# Patient Record
Sex: Male | Born: 1950 | Race: White | Hispanic: No | Marital: Married | Smoking: Former smoker
Health system: Southern US, Community
[De-identification: ages and names within clinical notes are randomized; demographics above are authoritative.]

## PROBLEM LIST (undated history)

## (undated) DIAGNOSIS — K2970 Gastritis, unspecified, without bleeding: Secondary | ICD-10-CM

## (undated) DIAGNOSIS — N529 Male erectile dysfunction, unspecified: Secondary | ICD-10-CM

## (undated) DIAGNOSIS — I10 Essential (primary) hypertension: Secondary | ICD-10-CM

## (undated) DIAGNOSIS — K219 Gastro-esophageal reflux disease without esophagitis: Secondary | ICD-10-CM

## (undated) DIAGNOSIS — I208 Other forms of angina pectoris: Secondary | ICD-10-CM

## (undated) DIAGNOSIS — Z8719 Personal history of other diseases of the digestive system: Secondary | ICD-10-CM

## (undated) DIAGNOSIS — E291 Testicular hypofunction: Secondary | ICD-10-CM

## (undated) DIAGNOSIS — E039 Hypothyroidism, unspecified: Secondary | ICD-10-CM

## (undated) DIAGNOSIS — E7800 Pure hypercholesterolemia, unspecified: Secondary | ICD-10-CM

## (undated) DIAGNOSIS — I2089 Other forms of angina pectoris: Secondary | ICD-10-CM

## (undated) DIAGNOSIS — E669 Obesity, unspecified: Secondary | ICD-10-CM

## (undated) DIAGNOSIS — D369 Benign neoplasm, unspecified site: Secondary | ICD-10-CM

## (undated) DIAGNOSIS — G4733 Obstructive sleep apnea (adult) (pediatric): Secondary | ICD-10-CM

## (undated) DIAGNOSIS — J309 Allergic rhinitis, unspecified: Secondary | ICD-10-CM

## (undated) DIAGNOSIS — R7303 Prediabetes: Secondary | ICD-10-CM

## (undated) DIAGNOSIS — K760 Fatty (change of) liver, not elsewhere classified: Secondary | ICD-10-CM

## (undated) DIAGNOSIS — M519 Unspecified thoracic, thoracolumbar and lumbosacral intervertebral disc disorder: Secondary | ICD-10-CM

## (undated) HISTORY — PX: VASECTOMY: SHX75

## (undated) HISTORY — DX: Essential (primary) hypertension: I10

## (undated) HISTORY — DX: Unspecified thoracic, thoracolumbar and lumbosacral intervertebral disc disorder: M51.9

## (undated) HISTORY — DX: Benign neoplasm, unspecified site: D36.9

## (undated) HISTORY — DX: Obesity, unspecified: E66.9

## (undated) HISTORY — DX: Obstructive sleep apnea (adult) (pediatric): G47.33

## (undated) HISTORY — DX: Testicular hypofunction: E29.1

## (undated) HISTORY — PX: OTHER SURGICAL HISTORY: SHX169

## (undated) HISTORY — DX: Other forms of angina pectoris: I20.8

## (undated) HISTORY — DX: Male erectile dysfunction, unspecified: N52.9

## (undated) HISTORY — DX: Allergic rhinitis, unspecified: J30.9

## (undated) HISTORY — DX: Prediabetes: R73.03

## (undated) HISTORY — DX: Personal history of other diseases of the digestive system: Z87.19

## (undated) HISTORY — DX: Pure hypercholesterolemia, unspecified: E78.00

## (undated) HISTORY — DX: Gastritis, unspecified, without bleeding: K29.70

## (undated) HISTORY — DX: Other forms of angina pectoris: I20.89

## (undated) HISTORY — DX: Fatty (change of) liver, not elsewhere classified: K76.0

---
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
## 1980-11-07 DIAGNOSIS — Z8719 Personal history of other diseases of the digestive system: Secondary | ICD-10-CM

## 1980-11-07 HISTORY — DX: Personal history of other diseases of the digestive system: Z87.19

## 2005-06-23 ENCOUNTER — Ambulatory Visit (HOSPITAL_COMMUNITY): Admission: RE | Admit: 2005-06-23 | Discharge: 2005-06-23 | Payer: Self-pay | Admitting: Family Medicine

## 2005-07-08 ENCOUNTER — Ambulatory Visit: Admission: RE | Admit: 2005-07-08 | Discharge: 2005-07-08 | Payer: Self-pay | Admitting: Family Medicine

## 2006-01-06 ENCOUNTER — Emergency Department (HOSPITAL_COMMUNITY): Admission: EM | Admit: 2006-01-06 | Discharge: 2006-01-06 | Payer: Self-pay | Admitting: Emergency Medicine

## 2008-01-24 ENCOUNTER — Ambulatory Visit (HOSPITAL_COMMUNITY): Admission: RE | Admit: 2008-01-24 | Discharge: 2008-01-24 | Payer: Self-pay | Admitting: Family Medicine

## 2009-11-07 HISTORY — PX: OTHER SURGICAL HISTORY: SHX169

## 2010-10-14 ENCOUNTER — Ambulatory Visit: Payer: Self-pay | Admitting: Cardiology

## 2011-09-15 ENCOUNTER — Encounter (HOSPITAL_COMMUNITY): Payer: Self-pay | Admitting: Pharmacy Technician

## 2011-09-16 ENCOUNTER — Other Ambulatory Visit: Payer: Self-pay | Admitting: Orthopedic Surgery

## 2011-09-23 NOTE — H&P (Signed)
  Martin Hopkins 09/23/2011 9:52 AM Location: [facility] DOB: 1951/05/01 Married / Language: English / Race: White Male   History of Present Illness The patient is a 60 year old male who presents today for history and physical. They have been seen for low back pain radiating down his left lower extremity following an injury.The patient presents today for a complete history and physical in preparation for his hemilaminectomy/microdiscectomy at L5-S1 on Wednesday, September 28, 2011.   Problem List/Past Medical Pain in joint, lower leg (719.46). 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 02/16/1988   Allergies PENICILLIN. 12/04/2009   Family History Myocardial Infarction. Father. deceased age 5 Hypertension. Brother, Mother.   Social History Marital status. Married. Tobacco use. Former smoker. Alcohol use. Never consumed alcohol. Children. 3. Living situation. Lives with spouse. 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 Living will   Medication History Synthroid ( Tablet, Oral) Active. Zocor (40MG  Tablet, Oral) Active.   Past Surgical History "Nose" surgery 1972 Tenosynovectomy 2011. Right. long finger   Other Problems Hypercholesterolemia. Currently on Zocor 40mg  daily Hypothyroidism. Currently on Synthroid 0.25mg  daily Measles. Childhood Mumps. Childhood   Review of Systems General:Not Present- Chills, Fever, Night Sweats, Fatigue, Weight Gain, Weight Loss and Memory Loss. Skin:Not Present- Hives, Itching, Rash, Eczema and Lesions. HEENT:Not Present- Tinnitus, Headache, Double Vision, Visual Loss, Hearing Loss and Dentures. Respiratory:Not Present- Shortness of breath with exertion, Shortness of breath at rest, Allergies, Coughing up blood and Chronic Cough. Cardiovascular:Not  Present- Chest Pain, Racing/skipping heartbeats, Difficulty Breathing Lying Down, Murmur, Swelling and Palpitations. Gastrointestinal:Not Present- Bloody Stool, Heartburn, Abdominal Pain, Vomiting, Nausea, Constipation, Diarrhea, Difficulty Swallowing, Jaundice and Loss of appetitie. Male Genitourinary:Not Present- Urinary frequency, Blood in Urine, Weak urinary stream, Discharge, Flank Pain, Incontinence, Painful Urination, Urgency, Urinary Retention and Urinating at Night. Musculoskeletal:Present- Muscle Pain and Backache. Not Present- Joint Swelling, Joint Pain, Back Pain, Morning Stiffness and Spasms. Neurological:Not Present- Tremor, Dizziness, Blackout spells, Paralysis, Difficulty with balance and Weakness. Psychiatric:Not Present- Insomnia.   Vitals Weight: 225 lb Pulse: 88 (Regular) Resp.: 18 (Unlabored) BP: 138/84 (Sitting, Left Arm, Standard)   Physical Exam The physical exam findings are as follows:  General Mental Status - Alert, cooperative and good historian. General Appearance- pleasant. Not in acute distress. Orientation- Oriented X3. Build & Nutrition- Well nourished and Well developed.  Head and Neck Head- normocephalic, atraumatic . Neck Global Assessment- supple. no bruit auscultated on the right and no bruit auscultated on the left.  Eye Pupil- Bilateral- PERRLA. Motion- Bilateral- EOMI.  Chest and Lung Exam Auscultation: Breath sounds:- clear at anterior chest wall and - clear at posterior chest wall. Adventitious sounds:- No Adventitious sounds.  Cardiovascular Auscultation:Rhythm- Regular rate and rhythm. Heart Sounds- S1 WNL and S2 WNL. Murmurs & Other Heart Sounds:Auscultation of the heart reveals - No Murmurs.  Abdomen Palpation/Percussion:Tenderness- Abdomen is non-tender to palpation. Rigidity (guarding)- Abdomen is soft. Auscultation:Auscultation of the abdomen reveals - Bowel sounds  normal.  Musculoskeletal The patient has painful range of motion in his back. Hyperflexion at the lumbar spine results in left sciatica pain. No CVA tenderness or spasms. Straight leg raise is negative bilaterally. Strength and sensation intact. Pt is neurovascular intact. DTR normal bilaterally. Pt ambulates without support.  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

## 2011-09-26 ENCOUNTER — Ambulatory Visit (HOSPITAL_COMMUNITY)
Admission: RE | Admit: 2011-09-26 | Discharge: 2011-09-26 | Disposition: A | Discharge status: Home / Self Care | Payer: PRIVATE HEALTH INSURANCE | Source: Ambulatory Visit | Attending: Orthopedic Surgery | Admitting: Orthopedic Surgery

## 2011-09-26 ENCOUNTER — Other Ambulatory Visit: Payer: Self-pay | Admitting: Orthopedic Surgery

## 2011-09-26 ENCOUNTER — Encounter (HOSPITAL_COMMUNITY): Payer: Self-pay

## 2011-09-26 ENCOUNTER — Encounter (HOSPITAL_COMMUNITY)
Admission: RE | Admit: 2011-09-26 | Discharge: 2011-09-26 | Disposition: A | Discharge status: Home / Self Care | Payer: PRIVATE HEALTH INSURANCE | Source: Ambulatory Visit | Attending: Orthopedic Surgery | Admitting: Orthopedic Surgery

## 2011-09-26 DIAGNOSIS — Z01812 Encounter for preprocedural laboratory examination: Secondary | ICD-10-CM | POA: Insufficient documentation

## 2011-09-26 DIAGNOSIS — IMO0002 Reserved for concepts with insufficient information to code with codable children: Secondary | ICD-10-CM | POA: Insufficient documentation

## 2011-09-26 DIAGNOSIS — M549 Dorsalgia, unspecified: Secondary | ICD-10-CM | POA: Insufficient documentation

## 2011-09-26 DIAGNOSIS — I700 Atherosclerosis of aorta: Secondary | ICD-10-CM | POA: Insufficient documentation

## 2011-09-26 DIAGNOSIS — I708 Atherosclerosis of other arteries: Secondary | ICD-10-CM | POA: Insufficient documentation

## 2011-09-26 DIAGNOSIS — M47817 Spondylosis without myelopathy or radiculopathy, lumbosacral region: Secondary | ICD-10-CM | POA: Insufficient documentation

## 2011-09-26 HISTORY — DX: Hypothyroidism, unspecified: E03.9

## 2011-09-26 HISTORY — DX: Gastro-esophageal reflux disease without esophagitis: K21.9

## 2011-09-26 LAB — URINALYSIS, ROUTINE W REFLEX MICROSCOPIC
Bilirubin Urine: NEGATIVE
Glucose, UA: NEGATIVE mg/dL
Ketones, ur: NEGATIVE mg/dL
Leukocytes, UA: NEGATIVE
Nitrite: NEGATIVE
Protein, ur: NEGATIVE mg/dL
Specific Gravity, Urine: 1.004 — ABNORMAL LOW (ref 1.005–1.030)
Urobilinogen, UA: 0.2 mg/dL (ref 0.0–1.0)
pH: 6.5 (ref 5.0–8.0)

## 2011-09-26 LAB — APTT: aPTT: 37 seconds (ref 24–37)

## 2011-09-26 LAB — PROTIME-INR
INR: 1 (ref 0.00–1.49)
Prothrombin Time: 13.4 seconds (ref 11.6–15.2)

## 2011-09-26 LAB — DIFFERENTIAL
Basophils Absolute: 0 10*3/uL (ref 0.0–0.1)
Basophils Relative: 1 % (ref 0–1)
Eosinophils Absolute: 0.2 10*3/uL (ref 0.0–0.7)
Eosinophils Relative: 2 % (ref 0–5)
Lymphocytes Relative: 31 % (ref 12–46)
Lymphs Abs: 2.3 10*3/uL (ref 0.7–4.0)
Monocytes Absolute: 0.7 10*3/uL (ref 0.1–1.0)
Monocytes Relative: 9 % (ref 3–12)
Neutro Abs: 4.3 10*3/uL (ref 1.7–7.7)
Neutrophils Relative %: 57 % (ref 43–77)

## 2011-09-26 LAB — SURGICAL PCR SCREEN
MRSA, PCR: NEGATIVE
Staphylococcus aureus: NEGATIVE

## 2011-09-26 LAB — COMPREHENSIVE METABOLIC PANEL
ALT: 70 U/L — ABNORMAL HIGH (ref 0–53)
AST: 42 U/L — ABNORMAL HIGH (ref 0–37)
Albumin: 4.3 g/dL (ref 3.5–5.2)
Alkaline Phosphatase: 73 U/L (ref 39–117)
BUN: 12 mg/dL (ref 6–23)
CO2: 28 mEq/L (ref 19–32)
Calcium: 9.7 mg/dL (ref 8.4–10.5)
Chloride: 102 mEq/L (ref 96–112)
Creatinine, Ser: 0.86 mg/dL (ref 0.50–1.35)
GFR calc Af Amer: >90 mL/min (ref >90)
GFR calc non Af Amer: >90 mL/min (ref >90)
Glucose, Bld: 103 mg/dL — ABNORMAL HIGH (ref 70–99)
Potassium: 4.8 mEq/L (ref 3.5–5.1)
Sodium: 138 mEq/L (ref 135–145)
Total Bilirubin: 0.5 mg/dL (ref 0.3–1.2)
Total Protein: 8 g/dL (ref 6.0–8.3)

## 2011-09-26 LAB — CBC
HCT: 41.9 % (ref 39.0–52.0)
Hemoglobin: 14.2 g/dL (ref 13.0–17.0)
MCH: 30.7 pg (ref 26.0–34.0)
MCHC: 33.9 g/dL (ref 30.0–36.0)
MCV: 90.5 fL (ref 78.0–100.0)
Platelets: 203 10*3/uL (ref 150–400)
RBC: 4.63 MIL/uL (ref 4.22–5.81)
RDW: 12.8 % (ref 11.5–15.5)
WBC: 7.4 10*3/uL (ref 4.0–10.5)

## 2011-09-26 LAB — URINE MICROSCOPIC-ADD ON

## 2011-09-26 NOTE — Patient Instructions (Addendum)
20 Horace A Loughney  09/26/2011   Your procedure is scheduled on:  09/28/11 0830 am-1030 am   Report to [facility] at 0630 AM.  Call this number if you have problems the morning of surgery: [phone]   Remember:   Do not eat food:After Midnight.  Do not drink clear liquids: After Midnight.  Take these medicines the morning of surgery with A SIP OF WATER:Synthroid, Zocor    Do not wear jewelry,   Do not wear lotions, powders, or perfumes.   Do not shave 48 hours prior to surgery.  Do not bring valuables to the hospital.  Contacts, dentures or bridgework may not be worn into surgery.  Leave suitcase in the car. After surgery it may be brought to your room.  For patients admitted to the hospital, checkout time is 11:00 AM the day of discharge.    Special Instructions: CHG Shower Use Special Wash: 1/2 bottle night before surgery and 1/2 bottle morning of surgery. Shower with CHG chin to toes . Wash face and private parts with regular soap.   Please read over the following fact sheets that you were given: MRSA Information, blood transfusion fact sheet , coughing and deep breathing exercises, Incentive Spirometry

## 2011-09-28 ENCOUNTER — Inpatient Hospital Stay (HOSPITAL_COMMUNITY)
Admission: AD | Admit: 2011-09-28 | Discharge: 2011-09-29 | DRG: 491 | Disposition: A | Discharge status: Home / Self Care | Payer: PRIVATE HEALTH INSURANCE | Source: Ambulatory Visit | Attending: Orthopedic Surgery | Admitting: Orthopedic Surgery

## 2011-09-28 ENCOUNTER — Other Ambulatory Visit: Payer: Self-pay | Admitting: Orthopedic Surgery

## 2011-09-28 ENCOUNTER — Ambulatory Visit (HOSPITAL_COMMUNITY): Payer: PRIVATE HEALTH INSURANCE

## 2011-09-28 ENCOUNTER — Encounter (HOSPITAL_COMMUNITY): Payer: Self-pay | Admitting: Anesthesiology

## 2011-09-28 ENCOUNTER — Encounter (HOSPITAL_COMMUNITY)
Admission: AD | Disposition: A | Discharge status: Home / Self Care | Payer: Self-pay | Source: Ambulatory Visit | Attending: Orthopedic Surgery

## 2011-09-28 ENCOUNTER — Ambulatory Visit (HOSPITAL_COMMUNITY): Payer: PRIVATE HEALTH INSURANCE | Admitting: Anesthesiology

## 2011-09-28 ENCOUNTER — Encounter (HOSPITAL_COMMUNITY): Payer: Self-pay

## 2011-09-28 DIAGNOSIS — M48061 Spinal stenosis, lumbar region without neurogenic claudication: Secondary | ICD-10-CM | POA: Diagnosis present

## 2011-09-28 DIAGNOSIS — Q762 Congenital spondylolisthesis: Principal | ICD-10-CM

## 2011-09-28 DIAGNOSIS — M5126 Other intervertebral disc displacement, lumbar region: Secondary | ICD-10-CM | POA: Diagnosis present

## 2011-09-28 HISTORY — PX: LUMBAR LAMINECTOMY: SHX95

## 2011-09-28 LAB — TYPE AND SCREEN
ABO/RH(D): O POS
Antibody Screen: NEGATIVE

## 2011-09-28 LAB — ABO/RH: ABO/RH(D): O POS

## 2011-09-28 SURGERY — MICRODISCECTOMY LUMBAR LAMINECTOMY
Anesthesia: General | Site: Back | Laterality: Left | Wound class: Clean

## 2011-09-28 MED ORDER — ACETAMINOPHEN 650 MG RE SUPP: 650.0000 mg | RECTAL | Status: DC | PRN

## 2011-09-28 MED ORDER — ACETAMINOPHEN 10 MG/ML IV SOLN
INTRAVENOUS | Status: DC | PRN
  Administered 2011-09-28: 1000 mg via INTRAVENOUS

## 2011-09-28 MED ORDER — BUPIVACAINE LIPOSOME 1.3 % IJ SUSP
20.0000 mL | INTRAMUSCULAR | Status: DC
  Filled 2011-09-28: qty 20

## 2011-09-28 MED ORDER — PHENOL 1.4 % MT LIQD: 1.0000 | OROMUCOSAL | Status: DC | PRN

## 2011-09-28 MED ORDER — NEOSTIGMINE METHYLSULFATE 1 MG/ML IJ SOLN
INTRAMUSCULAR | Status: DC | PRN
  Administered 2011-09-28: 4 mg via INTRAVENOUS

## 2011-09-28 MED ORDER — SIMVASTATIN 40 MG PO TABS: 40.0000 mg | ORAL_TABLET | Freq: Every day | ORAL | Status: DC

## 2011-09-28 MED ORDER — PROMETHAZINE HCL 25 MG/ML IJ SOLN: 6.2500 mg | INTRAMUSCULAR | Status: DC | PRN

## 2011-09-28 MED ORDER — SODIUM CHLORIDE 0.9 % IJ SOLN
INTRAMUSCULAR | Status: DC | PRN
  Administered 2011-09-28: 30 mL

## 2011-09-28 MED ORDER — HYDROMORPHONE HCL PF 1 MG/ML IJ SOLN
0.2500 mg | INTRAMUSCULAR | Status: DC | PRN
  Administered 2011-09-28: 0.5 mg via INTRAVENOUS

## 2011-09-28 MED ORDER — LACTATED RINGERS IV SOLN: INTRAVENOUS | Status: DC

## 2011-09-28 MED ORDER — LACTATED RINGERS IV SOLN
INTRAVENOUS | Status: DC
  Administered 2011-09-28: 11:00:00 via INTRAVENOUS

## 2011-09-28 MED ORDER — GLYCOPYRROLATE 0.2 MG/ML IJ SOLN
INTRAMUSCULAR | Status: DC | PRN
  Administered 2011-09-28: .6 mg via INTRAVENOUS

## 2011-09-28 MED ORDER — ALUM & MAG HYDROXIDE-SIMETH 400-400-40 MG/5ML PO SUSP
30.0000 mL | Freq: Four times a day (QID) | ORAL | Status: DC | PRN
  Filled 2011-09-28: qty 30

## 2011-09-28 MED ORDER — SUCCINYLCHOLINE CHLORIDE 20 MG/ML IJ SOLN
INTRAMUSCULAR | Status: DC | PRN
  Administered 2011-09-28: 100 mg via INTRAVENOUS

## 2011-09-28 MED ORDER — LACTATED RINGERS IV SOLN
INTRAVENOUS | Status: DC | PRN
  Administered 2011-09-28: 08:00:00 via INTRAVENOUS

## 2011-09-28 MED ORDER — BACITRACIN-NEOMYCIN-POLYMYXIN OINTMENT TUBE
TOPICAL_OINTMENT | CUTANEOUS | Status: DC | PRN
  Administered 2011-09-28: 1 via TOPICAL

## 2011-09-28 MED ORDER — ACETAMINOPHEN 325 MG PO TABS
650.0000 mg | ORAL_TABLET | ORAL | Status: DC | PRN
  Administered 2011-09-28 – 2011-09-29 (×4): 650 mg via ORAL
  Filled 2011-09-28 (×2): qty 2
  Filled 2011-09-28: qty 1
  Filled 2011-09-28: qty 2

## 2011-09-28 MED ORDER — PROPOFOL 10 MG/ML IV EMUL
INTRAVENOUS | Status: DC | PRN
  Administered 2011-09-28: 200 mg via INTRAVENOUS

## 2011-09-28 MED ORDER — METHOCARBAMOL 100 MG/ML IJ SOLN
500.0000 mg | Freq: Four times a day (QID) | INTRAMUSCULAR | Status: DC | PRN
  Administered 2011-09-28: 500 mg via INTRAVENOUS
  Filled 2011-09-28 (×2): qty 5

## 2011-09-28 MED ORDER — METHOCARBAMOL 500 MG PO TABS: 500.0000 mg | ORAL_TABLET | Freq: Three times a day (TID) | ORAL | Status: AC

## 2011-09-28 MED ORDER — ONDANSETRON HCL 4 MG/2ML IJ SOLN: 4.0000 mg | INTRAMUSCULAR | Status: DC | PRN

## 2011-09-28 MED ORDER — BUPIVACAINE LIPOSOME 1.3 % IJ SUSP
INTRAMUSCULAR | Status: DC | PRN
  Administered 2011-09-28: 20 mL

## 2011-09-28 MED ORDER — THROMBIN 5000 UNITS EX KIT
PACK | CUTANEOUS | Status: DC | PRN
  Administered 2011-09-28: 10000 [IU] via TOPICAL

## 2011-09-28 MED ORDER — SODIUM CHLORIDE 0.9 % IR SOLN
Status: DC | PRN
  Administered 2011-09-28: 09:00:00

## 2011-09-28 MED ORDER — MENTHOL 3 MG MT LOZG
1.0000 | LOZENGE | OROMUCOSAL | Status: DC | PRN
  Filled 2011-09-28 (×2): qty 9

## 2011-09-28 MED ORDER — EPHEDRINE SULFATE 50 MG/ML IJ SOLN
INTRAMUSCULAR | Status: DC | PRN
  Administered 2011-09-28 (×2): 5 mg via INTRAVENOUS
  Administered 2011-09-28: 10 mg via INTRAVENOUS
  Administered 2011-09-28: 5 mg via INTRAVENOUS

## 2011-09-28 MED ORDER — MIDAZOLAM HCL 5 MG/5ML IJ SOLN
INTRAMUSCULAR | Status: DC | PRN
  Administered 2011-09-28: 2 mg via INTRAVENOUS

## 2011-09-28 MED ORDER — CISATRACURIUM BESYLATE 2 MG/ML IV SOLN
INTRAVENOUS | Status: DC | PRN
  Administered 2011-09-28: 6 mg via INTRAVENOUS
  Administered 2011-09-28 (×2): 2 mg via INTRAVENOUS

## 2011-09-28 MED ORDER — CLINDAMYCIN PHOSPHATE 600 MG/50ML IV SOLN
600.0000 mg | Freq: Four times a day (QID) | INTRAVENOUS | Status: AC
  Administered 2011-09-28 – 2011-09-29 (×3): 600 mg via INTRAVENOUS
  Filled 2011-09-28 (×5): qty 50

## 2011-09-28 MED ORDER — LACTATED RINGERS IV SOLN
INTRAVENOUS | Status: DC
  Administered 2011-09-28: 15:00:00 via INTRAVENOUS

## 2011-09-28 MED ORDER — SUFENTANIL CITRATE 50 MCG/ML IV SOLN
INTRAVENOUS | Status: DC | PRN
  Administered 2011-09-28: 20 ug via INTRAVENOUS
  Administered 2011-09-28: 10 ug via INTRAVENOUS
  Administered 2011-09-28: 5 ug via INTRAVENOUS

## 2011-09-28 MED ORDER — METHOCARBAMOL 500 MG PO TABS
500.0000 mg | ORAL_TABLET | Freq: Four times a day (QID) | ORAL | Status: DC | PRN
  Administered 2011-09-28 – 2011-09-29 (×2): 500 mg via ORAL
  Filled 2011-09-28 (×2): qty 1

## 2011-09-28 MED ORDER — DEXAMETHASONE SODIUM PHOSPHATE 10 MG/ML IJ SOLN
INTRAMUSCULAR | Status: DC | PRN
  Administered 2011-09-28: 10 mg via INTRAVENOUS

## 2011-09-28 MED ORDER — CLINDAMYCIN PHOSPHATE 600 MG/50ML IV SOLN
600.0000 mg | Freq: Once | INTRAVENOUS | Status: AC
  Administered 2011-09-28: 600 mg via INTRAVENOUS

## 2011-09-28 MED ORDER — OXYCODONE-ACETAMINOPHEN 10-650 MG PO TABS: 1.0000 | ORAL_TABLET | Freq: Four times a day (QID) | ORAL | Status: AC | PRN

## 2011-09-28 MED ORDER — ONDANSETRON HCL 4 MG/2ML IJ SOLN
INTRAMUSCULAR | Status: DC | PRN
  Administered 2011-09-28: 4 mg via INTRAVENOUS

## 2011-09-28 SURGICAL SUPPLY — 42 items
BAG ZIPLOCK 12X15 (MISCELLANEOUS) ×2 IMPLANT
BENZOIN TINCTURE PRP APPL 2/3 (GAUZE/BANDAGES/DRESSINGS) ×2 IMPLANT
CLEANER TIP ELECTROSURG 2X2 (MISCELLANEOUS) ×2 IMPLANT
CLOTH BEACON ORANGE TIMEOUT ST (SAFETY) ×2 IMPLANT
CONT SPECI 4OZ STER CLIK (MISCELLANEOUS) ×2 IMPLANT
DECANTER SPIKE VIAL GLASS SM (MISCELLANEOUS) ×2 IMPLANT
DRAPE MICROSCOPE LEICA (MISCELLANEOUS) ×2 IMPLANT
DRAPE POUCH INSTRU U-SHP 10X18 (DRAPES) ×2 IMPLANT
DRAPE SURG 17X11 SM STRL (DRAPES) ×2 IMPLANT
DRSG ADAPTIC 3X8 NADH LF (GAUZE/BANDAGES/DRESSINGS) ×2 IMPLANT
DRSG EMULSION OIL 3X3 NADH (GAUZE/BANDAGES/DRESSINGS) ×2 IMPLANT
DRSG PAD ABDOMINAL 8X10 ST (GAUZE/BANDAGES/DRESSINGS) ×2 IMPLANT
DURAPREP 26ML APPLICATOR (WOUND CARE) ×2 IMPLANT
ELECT BLADE TIP CTD 4 INCH (ELECTRODE) ×2 IMPLANT
ELECT REM PT RETURN 9FT ADLT (ELECTROSURGICAL) ×2
ELECTRODE REM PT RTRN 9FT ADLT (ELECTROSURGICAL) ×1 IMPLANT
GAUZE SPONGE 4X4 12PLY STRL LF (GAUZE/BANDAGES/DRESSINGS) ×2 IMPLANT
GLOVE BIOGEL PI IND STRL 8.5 (GLOVE) ×2 IMPLANT
GLOVE BIOGEL PI INDICATOR 8.5 (GLOVE) ×2
GLOVE ECLIPSE 6.5 STRL STRAW (GLOVE) ×2 IMPLANT
GLOVE ECLIPSE 8.0 STRL XLNG CF (GLOVE) ×4 IMPLANT
GOWN PREVENTION PLUS LG XLONG (DISPOSABLE) ×4 IMPLANT
GOWN STRL REIN XL XLG (GOWN DISPOSABLE) ×4 IMPLANT
KIT BASIN OR (CUSTOM PROCEDURE TRAY) ×2 IMPLANT
KIT POSITIONING SURG ANDREWS (MISCELLANEOUS) ×2 IMPLANT
MANIFOLD NEPTUNE II (INSTRUMENTS) ×2 IMPLANT
NEEDLE SPNL 18GX3.5 QUINCKE PK (NEEDLE) ×4 IMPLANT
PATTIES SURGICAL .5 X.5 (GAUZE/BANDAGES/DRESSINGS) IMPLANT
PATTIES SURGICAL .75X.75 (GAUZE/BANDAGES/DRESSINGS) IMPLANT
PATTIES SURGICAL 1X1 (DISPOSABLE) IMPLANT
SLEEVE SURGEON STRL (DRAPES) ×2 IMPLANT
SPONGE LAP 18X18 X RAY DECT (DISPOSABLE) ×2 IMPLANT
SPONGE LAP 4X18 X RAY DECT (DISPOSABLE) ×4 IMPLANT
SPONGE SURGIFOAM ABS GEL 100 (HEMOSTASIS) ×2 IMPLANT
STAPLER VISISTAT 35W (STAPLE) ×2 IMPLANT
SUT VIC AB 0 CT1 27 (SUTURE) ×2
SUT VIC AB 0 CT1 27XBRD ANTBC (SUTURE) ×2 IMPLANT
SUT VIC AB 1 CT1 27 (SUTURE) ×3
SUT VIC AB 1 CT1 27XBRD ANTBC (SUTURE) ×3 IMPLANT
TAPE CLOTH SURG 4X10 WHT LF (GAUZE/BANDAGES/DRESSINGS) ×2 IMPLANT
TOWEL OR 17X26 10 PK STRL BLUE (TOWEL DISPOSABLE) ×4 IMPLANT
TRAY LAMINECTOMY (CUSTOM PROCEDURE TRAY) ×2 IMPLANT

## 2011-09-28 NOTE — Anesthesia Postprocedure Evaluation (Signed)
  Anesthesia Post-op Note  Patient: Martin Hopkins  Procedure(s) Performed:  MICRODISCECTOMY LUMBAR LAMINECTOMY - Hemi-Laminectomy, Microdiscectomy  Lumbar 5 - Sacral 1 Left    Patient Location: PACU  Anesthesia Type: General  Level of Consciousness: oriented and sedated  Airway and Oxygen Therapy: Patient Spontanous Breathing  Post-op Pain: mild  Post-op Assessment: Post-op Vital signs reviewed, Patient's Cardiovascular Status Stable, Respiratory Function Stable and Patent Airway  Post-op Vital Signs: stable  Complications: No apparent anesthesia complications

## 2011-09-28 NOTE — Anesthesia Procedure Notes (Signed)
Procedure Name: Intubation Date/Time: 09/28/2011 8:30 AM Performed by: Lurlean Leyden, Davionne Dowty L. Patient Re-evaluated:Patient Re-evaluated prior to inductionOxygen Delivery Method: Circle System Utilized Preoxygenation: Pre-oxygenation with 100% oxygen Intubation Type: IV induction Ventilation: Mask ventilation without difficulty and Oral airway inserted - appropriate to patient size Laryngoscope Size: Miller and 3 Grade View: Grade II Tube type: Oral Tube size: 8.0 mm Number of attempts: 1 Airway Equipment and Method: stylet Placement Confirmation: ETT inserted through vocal cords under direct vision,  breath sounds checked- equal and bilateral and positive ETCO2 Secured at: 22 cm Tube secured with: Tape Dental Injury: Teeth and Oropharynx as per pre-operative assessment

## 2011-09-28 NOTE — Op Note (Signed)
NAMEDEMONTEZ, NOVACK NO.:  [id number]  MEDICAL RECORD NO.:  [id number]  LOCATION:  [facility]                         FACILITY:  [facility]  PHYSICIAN:  Georges Lynch. Haliegh Khurana, M.D.DATE OF BIRTH:  01/30/1951  DATE OF PROCEDURE:  09/28/2011 DATE OF DISCHARGE:                              OPERATIVE REPORT   SURGEON:  Georges Lynch. Darrelyn Hillock, M.D.  ASSISTANT:  Marlowe Kays, M.D.  PREOPERATIVE DIAGNOSES: 1. Grade 1 pseudospondylolisthesis of L5-S1. 2. Herniated disk at L5-S1, foraminal type. 3. 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  POSTOPERATIVE DIAGNOSES: 1. Grade 1 pseudospondylolisthesis of L5-S1. 2. Herniated disk at L5-S1, foraminal type. 3. 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  OPERATION: 1. Microdiskectomy at L5-S1 on the left. 2. Decompression of the lateral recess with complete laminectomy of L5     on the left.  Note the area here was extremely tight.  PROCEDURE:  Under general anesthesia, routine orthopedic prep and drape in the lower back was carried out.  Patient had clindamycin 600 mg IV. At this time, the appropriate time-out was carried out after sterile prep and draping.  Prior to doing this in the holding area, I marked the left side of his back where his herniated disk was.  At this particular time, 2 needles were placed in the back after the time-out.  X-ray was taken.  Incision then was made over L5-S1 space.  Bleeders identified and cauterized.  The incision was taken down through to the lamina and the spinous processes.  I then stripped the muscle from the lamina of L5- S1.  Several x-rays were taken to finally localize the space.  We were very careful in regards to try and locate the space, kind of a spondylolisthesis and a deformity present.  Finally after localizing the L5-S1 space, the microscope was brought in and then we carried out a complete laminectomy of L5.  We went down and decompressed the lateral recess,  which was extremely tight as well.  We identified a large thick ligamentum flavum that was literally plastered down to the dura.  I gradually separated out off the dura and there were no dural leaks occurred.  After this, we identified the S1 root.  We gently retracted that with D'Errico retractor.  A needle was placed in the disk space and an x-ray was taken.  At this time, we went out and traced the nerve root distally and proximally and medially and laterally.  We made a cruciate incision in the posterior longitudinal ligament and completed the diskectomy.  We also went on laterally to decompress the foramina as well.  We thoroughly irrigated out the area.  Dr. Simonne Come, during the procedure, did portion of the dissection to decompress the lateral recess and also assisted in holding the D'Errico retractors and help and identify the nerve root and also assisted in cauterization of the lateral recess veins.  After the wound was irrigated, I then loosely applied some thrombin-soaked Gelfoam.  I then utilized about 45 cc of Exparel, local Marcaine anesthetic into the soft tissue structures.  The wound then was closed in layers in usual fashion except  I left a small distal deep and proximal deep part of the wound open for drainage purposes.  Subcu was closed with 0 Vicryl.  The skin was closed with metal staples.          ______________________________ Georges Lynch Darrelyn Hillock, M.D.     RAG/MEDQ  D:  09/28/2011  T:  09/28/2011  Job:  [id number]

## 2011-09-28 NOTE — Transfer of Care (Signed)
Immediate Anesthesia Transfer of Care Note  Patient: Martin Hopkins  Procedure(s) Performed:  MICRODISCECTOMY LUMBAR LAMINECTOMY - Hemi-Laminectomy, Microdiscectomy  Lumbar 5 - Sacral 1 Left    Patient Location: PACU  Anesthesia Type: General  Level of Consciousness: awake, alert  and oriented  Airway & Oxygen Therapy: Patient Spontanous Breathing and Patient connected to face mask oxygen  Post-op Assessment: Report given to PACU RN and Post -op Vital signs reviewed and stable  Post vital signs: Reviewed and stable  Complications: No apparent anesthesia complications

## 2011-09-28 NOTE — Addendum Note (Signed)
Addendum  created 09/28/11 1226 by Darci Needle. Martin Hopkins   Modules edited:Anesthesia Medication Administration

## 2011-09-28 NOTE — Preoperative (Signed)
Beta Blockers   Reason not to administer Beta Blockers:Not Applicable 

## 2011-09-28 NOTE — Interval H&P Note (Signed)
History and Physical Interval Note:   09/28/2011   8:16 AM   Martin Hopkins  has presented today for surgery, with the diagnosis of herniated disc  The various methods of treatment have been discussed with the patient and family. After consideration of risks, benefits and other options for treatment, the patient has consented to  Procedure(s): MICRODISCECTOMY LUMBAR LAMINECTOMY as a surgical intervention .  The patients' history has been reviewed, patient examined, no change in status, stable for surgery.  I have reviewed the patients' chart and labs.  Questions were answered to the patient's satisfaction.     Jacki Cones  MD

## 2011-09-28 NOTE — Brief Op Note (Signed)
09/28/2011  10:41 AM  PATIENT:  Martin Hopkins  60 y.o. male  PRE-OPERATIVE DIAGNOSIS:  herniated disc lumbar five to sacral one left  POST-OPERATIVE DIAGNOSIS:  herniated disc lumbar five to sacral one left  PROCEDURE:  Procedure(s): MICRODISCECTOMY LUMBAR LAMINECTOMY  SURGEON:  Surgeon(s): Windy Fast A Jazzmon Prindle James P Aplington  PHYSICIAN ASSISTANT:   ASSISTANTS: Dr. Arrie Aran Aplington MD   ANESTHESIA:   general  EBL:  Total I/O In: 1800 [I.V.:1800] Out: 100 [Blood:100]  BLOOD ADMINISTERED:none  DRAINS: none   LOCAL MEDICATIONS USED:  MARCAINE 20CCExaprel with 30cc normal Saline.  SPECIMEN:  Source of Specimen:  L-5_S-1 on left  DISPOSITION OF SPECIMEN:  PATHOLOGY  COUNTS:  YES  TOURNIQUET:  * No tourniquets in log *  DICTATION: .Other Dictation: Dictation Number [id number]  PLAN OF CARE: Admit for overnight observation  PATIENT DISPOSITION:  PACU - hemodynamically stable.

## 2011-09-28 NOTE — Anesthesia Preprocedure Evaluation (Signed)
Anesthesia Evaluation  Patient identified by MRN, date of birth, ID band Patient awake    Reviewed: Allergy & Precautions, H&P , NPO status , Patient's Chart, lab work & pertinent test results, reviewed documented beta blocker date and time   Airway Mallampati: II  Neck ROM: Full    Dental No notable dental hx.    Pulmonary neg pulmonary ROS,  clear to auscultation        Cardiovascular Normal Denies cardiac symptoms   Neuro/Psych Negative Neurological ROS  Negative Psych ROS   GI/Hepatic negative GI ROS, Neg liver ROS,   Endo/Other  Hypothyroidism Thyroid replacement  Renal/GU negative Renal ROS  Genitourinary negative   Musculoskeletal negative musculoskeletal ROS (+)   Abdominal   Peds negative pediatric ROS (+)  Hematology negative hematology ROS (+)   Anesthesia Other Findings   Reproductive/Obstetrics negative OB ROS                           Anesthesia Physical Anesthesia Plan  ASA: II  Anesthesia Plan: General   Post-op Pain Management:    Induction: Intravenous  Airway Management Planned: Oral ETT  Additional Equipment:   Intra-op Plan:   Post-operative Plan: Extubation in OR  Informed Consent: I have reviewed the patients History and Physical, chart, labs and discussed the procedure including the risks, benefits and alternatives for the proposed anesthesia with the patient or authorized representative who has indicated his/her understanding and acceptance.     Plan Discussed with: CRNA and Surgeon  Anesthesia Plan Comments:         Anesthesia Quick Evaluation

## 2011-09-29 MED ORDER — METHOCARBAMOL 500 MG PO TABS: 500.0000 mg | ORAL_TABLET | Freq: Four times a day (QID) | ORAL | Status: DC | PRN

## 2011-09-29 NOTE — Progress Notes (Signed)
Pt discharged home with spouse in stable condition. [facility] notified for 3 in1 but pt states his sister n law had one. BSC brought to pt by family member. Discharge scripts and instructions given. Pt and spouse verbalized understanding

## 2011-09-29 NOTE — Progress Notes (Signed)
Physical Therapy Evaluation Patient Details Name: Martin Hopkins MRN: [id number] DOB: 12-30-1950 Today's Date: 09/29/2011  Problem List: There is no problem list on file for this patient.   Past Medical History:  Past Medical History  Diagnosis Date  . Hypothyroidism   . GERD (gastroesophageal reflux disease)     mild   Past Surgical History:  Past Surgical History  Procedure Date  . Other surgical history 1972    nasal surgery   . Other surgical history 2011    right trigger finger surgey     PT Assessment/Plan/Recommendation PT Assessment Clinical Impression Statement: Patient is s/p spinal surgery with good body mechanics and good safety with mobility.  Understands all back precautions and has supportive wife.  Will need a 3N1 commode only.  No f/u at this time.   PT Recommendation/Assessment: Patent does not need any further PT services No Skilled PT: All education completed;Patient will have necessary level of assist by caregiver at discharge;Patient is supervision for all activity/mobility PT Goals   N/A secondary to pt. Supervision level.  Will not follow patient in hospital.  PT Evaluation Precautions/Restrictions  Precautions Precautions: Fall;Back Precaution Booklet Issued: Yes (comment) Required Braces or Orthoses: No Restrictions Weight Bearing Restrictions: No Prior Functioning  Home Living Lives With: Spouse Receives Help From: Family Type of Home: House Home Layout: One level Home Access: Stairs to enter Entrance Stairs-Rails: Right Entrance Stairs-Number of Steps: 4 Bathroom Shower/Tub: Tub/shower unit;Door Teacher, early years/pre: Yes How Accessible: Accessible via walker Home Adaptive Equipment: None Additional Comments: Desires 3 n 1 for home use Prior Function Level of Independence: Independent with homemaking with ambulation Driving: Yes Vocation: Full time employment Cognition Cognition Arousal/Alertness:  Awake/alert Overall Cognitive Status: Appears within functional limits for tasks assessed Orientation Level: Oriented X4 Sensation/Coordination Sensation Light Touch: Appears Intact Stereognosis: Not tested Hot/Cold: Not tested Proprioception: Not tested Coordination Gross Motor Movements are Fluid and Coordinated: Yes Fine Motor Movements are Fluid and Coordinated: Yes Extremity Assessment RUE Assessment RUE Assessment: Within Functional Limits LUE Assessment LUE Assessment: Within Functional Limits RLE Assessment RLE Assessment: Within Functional Limits LLE Assessment LLE Assessment: Within Functional Limits Mobility (including Balance) Bed Mobility Bed Mobility: Yes Rolling Left: 5: Supervision Rolling Left Details (indicate cue type and reason): cues for technique Left Sidelying to Sit: 5: Supervision;HOB flat Left Sidelying to Sit Details (indicate cue type and reason): cues for technique Transfers Transfers: Yes Sit to Stand: 5: Supervision;From bed;With upper extremity assist;From elevated surface Sit to Stand Details (indicate cue type and reason): Pt. had no difficulty with sit to stand Stand to Sit: 5: Supervision;To chair/3-in-1;With upper extremity assist;With armrests Stand to Sit Details: used rails beside commode Ambulation/Gait Ambulation/Gait: Yes Ambulation/Gait Assistance: 5: Supervision Ambulation/Gait Assistance Details (indicate cue type and reason): Steady gait without LOB throughout.  No problems with gait stability with min challenges Ambulation Distance (Feet): 250 Feet Assistive device: None Gait Pattern: Step-through pattern Gait velocity: good cadence Stairs: Yes Stairs Assistance: 6: Modified independent (Device/Increase time) Stair Management Technique: One rail Right;Forwards;Alternating pattern Number of Stairs: 5  Height of Stairs: 5  Wheelchair Mobility Wheelchair Mobility: No  Posture/Postural Control Posture/Postural Control: No  significant limitations Balance Balance Assessed: No Exercise    End of Session PT - End of Session Equipment Utilized During Treatment: Gait belt Activity Tolerance: Patient tolerated treatment well Patient left: in chair;with call bell in reach;with family/visitor present Nurse Communication: Mobility status for ambulation General Behavior During Session: [facility] for tasks performed  Cognition: WFL for tasks performed  INGOLD,Rhea Kaelin 09/29/2011, 10:35 AM [facility] [phone] (pager)

## 2011-09-29 NOTE — Progress Notes (Signed)
PT evaluation completed. See notes for details. Patient needs a 3 N1 commode.  Please order if you agree.  No follow up therapy indicated.  Patient is supervision with mobility and wife to provide 24 hour care.  Thanks. [facility] [phone] (pager)

## 2011-09-29 NOTE — Progress Notes (Signed)
Subjective: 1 Day Post-Op Procedure(s) (LRB): MICRODISCECTOMY LUMBAR LAMINECTOMY (Left) Patient reports pain as 1 on 0-10 scale.    Objective: Vital signs in last 24 hours: Temp:  [97.6 F (36.4 C)-98.5 F (36.9 C)] 97.6 F (36.4 C) (11/22 0500) Pulse Rate:  [83-109] 83  (11/22 0500) Resp:  [7-22] 18  (11/22 0500) BP: (120-149)/(49-95) 120/49 mmHg (11/22 0500) SpO2:  [93 %-100 %] 96 % (11/22 0500) Weight:  [104.327 kg (230 lb)] 230 lb (104.327 kg) (11/21 1427)  Intake/Output from previous day: 11/21 0701 - 11/22 0700 In: 4154 [Hopkins.O.:250; I.V.:3904] Out: 2675 [Urine:2575; Blood:100] Intake/Output this shift:    No results found for this basename: HGB:5 in the last 72 hours No results found for this basename: WBC:2,RBC:2,HCT:2,PLT:2 in the last 72 hours No results found for this basename: NA:2,K:2,CL:2,CO2:2,BUN:2,CREATININE:2,GLUCOSE:2,CALCIUM:2 in the last 72 hours No results found for this basename: LABPT:2,INR:2 in the last 72 hours  Neurologically intact  Assessment/Plan: 1 Day Post-Op Procedure(s) (LRB): MICRODISCECTOMY LUMBAR LAMINECTOMY (Left) Up with therapy  Martin Hopkins 09/29/2011, 9:44 AM Pre op leg pain gone.  Has not yet been up.  Will discharge later today if ambulatory.

## 2011-10-03 ENCOUNTER — Encounter (HOSPITAL_COMMUNITY): Payer: Self-pay | Admitting: Orthopedic Surgery

## 2011-10-11 NOTE — Discharge Summary (Signed)
Physician Discharge Summary  Patient ID: Martin Hopkins MRN: [id number] DOB/AGE: 04-09-1951 60 y.o.  Admit date: 09/28/2011 Discharge date: 10/11/2011  Admission Diagnoses:  Discharge Diagnoses:  Active Problems:  * No active hospital problems. *    Discharged Condition: good  Hospital Course: No Complications  Consults: none  Significant Diagnostic Studies: radiology: X-Ray: Xrays in OR  Treatments: Lumbar Laminectomy L-5__S-1 left and spinal decompression at same level  Discharge Exam: Blood pressure 133/83, pulse 88, temperature 97.8 F (36.6 C), temperature source Oral, resp. rate 18, height 5\' 10"  (1.778 m), weight 104.327 kg (230 lb), SpO2 99.00%. Neurologic: Grossly normal  Disposition: Home or Self Care   Discharge Medication List as of 09/29/2011 11:46 AM    START taking these medications   Details  oxyCODONE-acetaminophen (PERCOCET) 10-650 MG per tablet Take 1 tablet by mouth every 6 (six) hours as needed for pain., Starting 09/29/2011, Until Sun 11/06/11, Print      CONTINUE these medications which have CHANGED   Details  methocarbamol (ROBAXIN) 500 MG tablet Take 1 tablet (500 mg total) by mouth 3 (three) times daily., Starting 09/29/2011, Until Sun 11/06/11, Print    simvastatin (ZOCOR) 40 MG tablet Take 1 tablet (40 mg total) by mouth at bedtime., Starting 09/28/2011, Until Thu 09/27/12, Print      CONTINUE these medications which have NOT CHANGED   Details  levothyroxine (SYNTHROID, LEVOTHROID) 25 MCG tablet Take 25 mcg by mouth every morning. , Until Discontinued, Historical Med       Follow-up Information    Follow up with Tashawn Greff A. Make an appointment in 2 weeks.   Contact information:   [facility] [address] [place] [address] [phone]          Signed: Jacki Cones 10/11/2011, 1:08 PM

## 2011-10-24 ENCOUNTER — Ambulatory Visit: Payer: PRIVATE HEALTH INSURANCE | Attending: Orthopedic Surgery

## 2011-10-24 DIAGNOSIS — M25659 Stiffness of unspecified hip, not elsewhere classified: Secondary | ICD-10-CM | POA: Insufficient documentation

## 2011-10-24 DIAGNOSIS — M545 Low back pain, unspecified: Secondary | ICD-10-CM | POA: Insufficient documentation

## 2011-10-24 DIAGNOSIS — IMO0001 Reserved for inherently not codable concepts without codable children: Secondary | ICD-10-CM | POA: Insufficient documentation

## 2011-10-28 ENCOUNTER — Ambulatory Visit: Payer: PRIVATE HEALTH INSURANCE

## 2011-11-04 ENCOUNTER — Encounter: Payer: PRIVATE HEALTH INSURANCE | Admitting: Physical Therapy

## 2011-11-09 ENCOUNTER — Ambulatory Visit: Payer: PRIVATE HEALTH INSURANCE | Attending: Orthopedic Surgery

## 2011-11-09 DIAGNOSIS — M25659 Stiffness of unspecified hip, not elsewhere classified: Secondary | ICD-10-CM | POA: Insufficient documentation

## 2011-11-09 DIAGNOSIS — M545 Low back pain, unspecified: Secondary | ICD-10-CM | POA: Insufficient documentation

## 2011-11-09 DIAGNOSIS — IMO0001 Reserved for inherently not codable concepts without codable children: Secondary | ICD-10-CM | POA: Insufficient documentation

## 2012-11-02 IMAGING — CR DG SPINE 1V PORT
1 series · 1 of 1 positions shown · non-contrast
Comparison: Portable exam 5159 hours compared to 2054 hours

CLINICAL DATA: Discectomy, intra upper localization, surgical level
L5-S1

DG SPINE PORTABLE - 1 VIEW

[lateral]
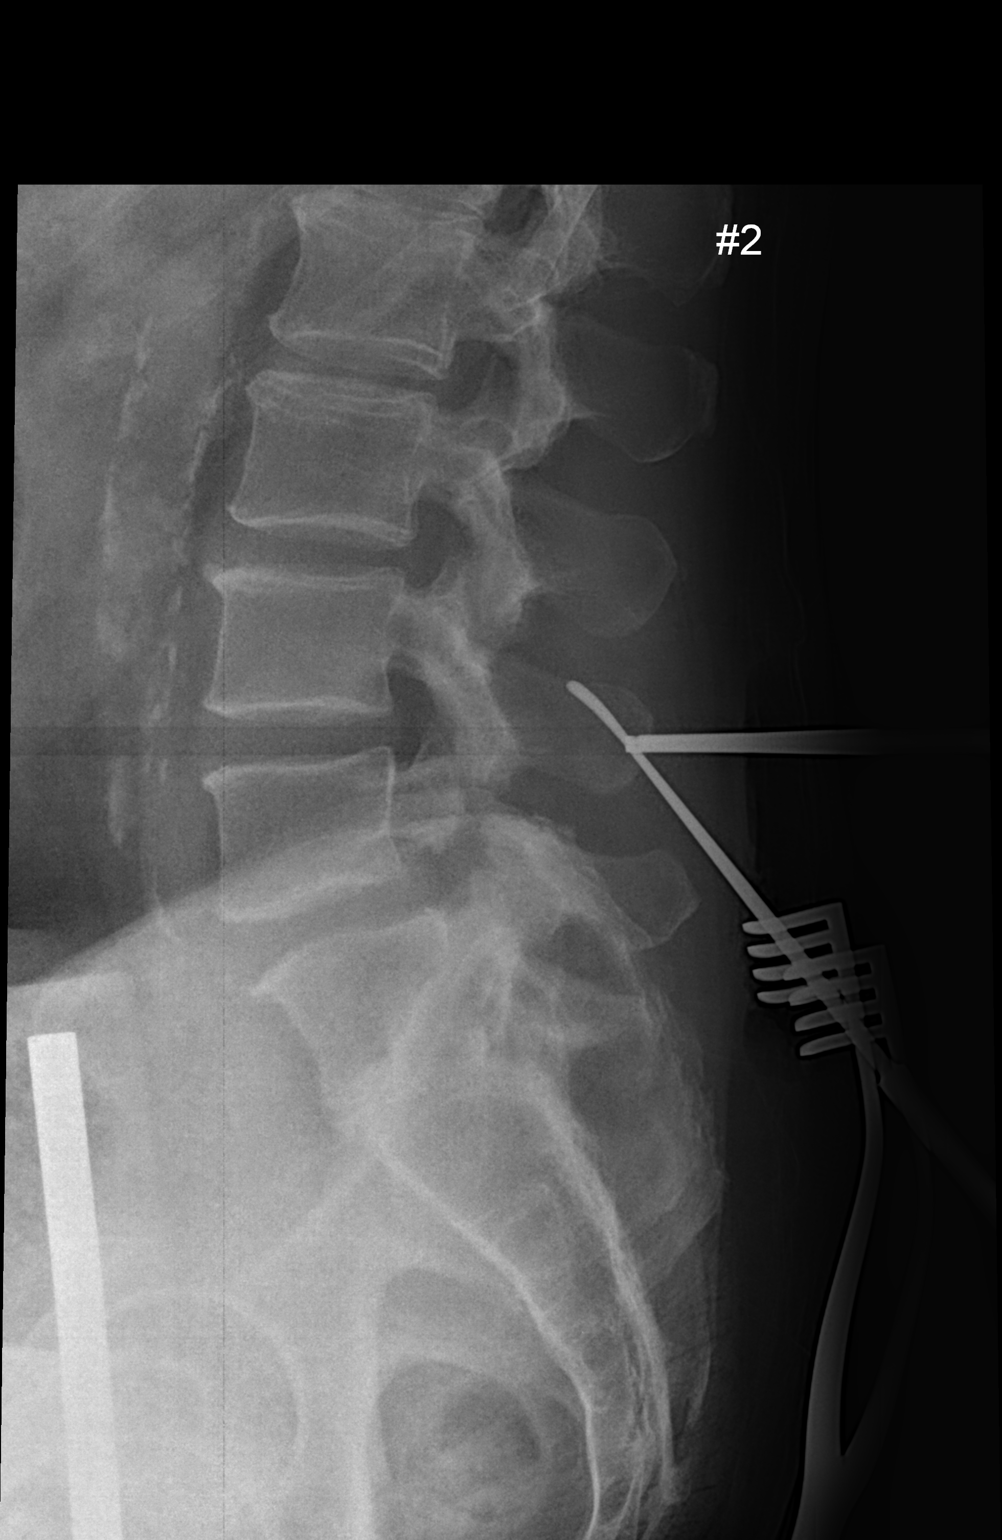

[1 of 1 positions shown; findings below may reference images not displayed]

FINDINGS: Five lumbar vertebra labeled on preoperative exam of 09/26/2011,
with slight lumbarization of S1.
Current exam labeled in a similar fashion.
Two posterior metallic probes are identified.
The more superior probe extends to the tip of the spinous process
of L4.
The more inferior probe is angled cranially and anteriorly with the
tip at the level of the inferior endplate of L4.
L5 spondylolysis noted with mild anterolisthesis L5-S1.
IMPRESSION: Probes extend to the inferior endplate of L4 and the spinous
process of L4 as above.

## 2012-11-02 IMAGING — CR DG LUMBAR SPINE 1V
1 series · 1 of 1 positions shown · non-contrast
Comparison: 09/28/2011

CLINICAL DATA: Surgical localization.

LUMBAR SPINE - 1 VIEW

[lateral]
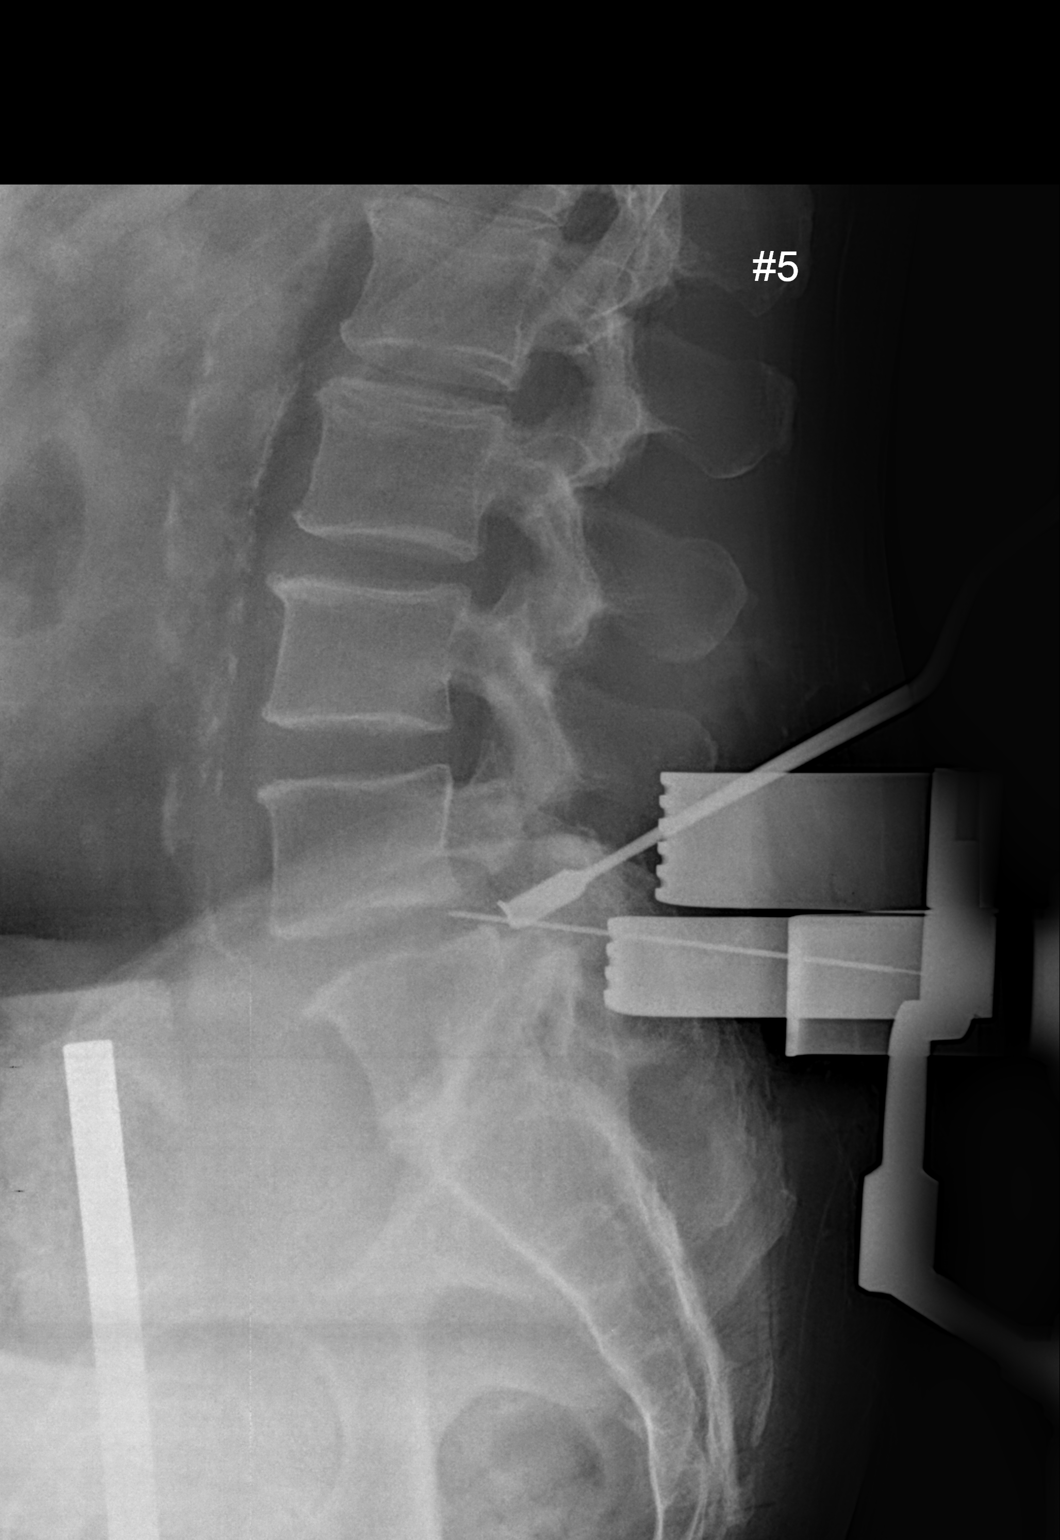

[1 of 1 positions shown; findings below may reference images not displayed]

FINDINGS: Transitional anatomy at the lumbosacral junction.  By
prior numbering, surgical instruments are at the L5-S1 level.
IMPRESSION: Intraoperative localization as above.

## 2012-11-02 IMAGING — CR DG SPINE 1V PORT
1 series · 1 of 1 positions shown · non-contrast
Comparison: Portable exam 6296 hours compared to 2221 hours

CLINICAL DATA: Microdiskectomy, interrupted localization, surgical
level of L5-S1

DG SPINE PORTABLE - 1 VIEW

[lateral]
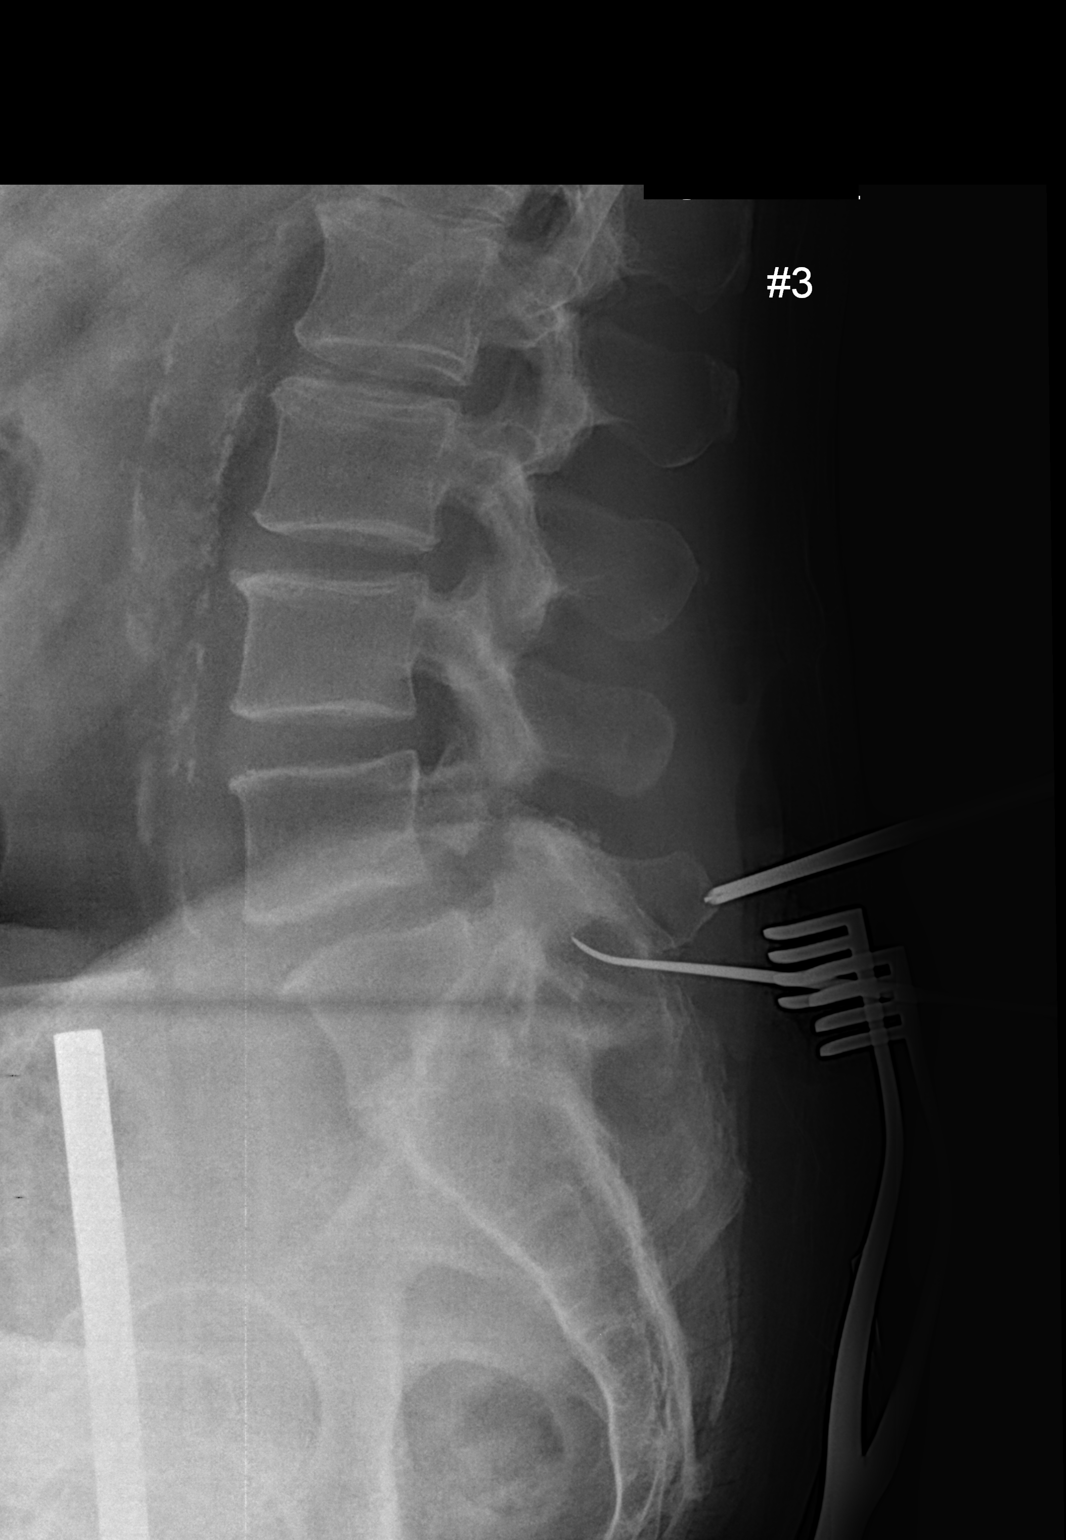

[1 of 1 positions shown; findings below may reference images not displayed]

FINDINGS: Preoperative exam of 09/26/2011 labeled with five lumbar vertebrae.
Current exam labeled accordingly.

Two metallic probes via posterior approach are seen.
The more superior probe localizes the tip of the spinous process of
L5.
The more inferior probe tip is seen posterior to the S1 segment of
the sacrum.
Bilateral spondylolysis of L5 with mild spondylolisthesis L5-S1
noted.
IMPRESSION: Posterior localization of the tip of the spinous process of L5 and
the S1 segment of the sacrum as above.

## 2012-11-07 HISTORY — PX: NECK SURGERY: SHX720

## 2013-12-30 ENCOUNTER — Ambulatory Visit: Payer: PRIVATE HEALTH INSURANCE | Admitting: Cardiology

## 2014-01-08 ENCOUNTER — Encounter: Payer: Self-pay | Admitting: Cardiology

## 2014-01-08 ENCOUNTER — Telehealth: Payer: Self-pay | Admitting: Cardiology

## 2014-01-08 ENCOUNTER — Ambulatory Visit (INDEPENDENT_AMBULATORY_CARE_PROVIDER_SITE_OTHER): Payer: BC Managed Care – PPO | Admitting: Cardiology

## 2014-01-08 ENCOUNTER — Encounter: Payer: Self-pay | Admitting: General Surgery

## 2014-01-08 VITALS — BP 132/79 | HR 91 | Ht 70.5 in | Wt 234.4 lb

## 2014-01-08 DIAGNOSIS — G4733 Obstructive sleep apnea (adult) (pediatric): Secondary | ICD-10-CM

## 2014-01-08 DIAGNOSIS — E669 Obesity, unspecified: Secondary | ICD-10-CM | POA: Insufficient documentation

## 2014-01-08 DIAGNOSIS — I10 Essential (primary) hypertension: Secondary | ICD-10-CM

## 2014-01-08 NOTE — Telephone Encounter (Signed)
Saw That modem is not working for pt. Waiting on response from Dr Radford Pax

## 2014-01-08 NOTE — Telephone Encounter (Signed)
[facility] is calling [facility] today regarding the modem.

## 2014-01-08 NOTE — Patient Instructions (Signed)
Your physician recommends that you continue on your current medications as directed. Please refer to the Current Medication list given to you today.  Call [facility] regarding your Modem not picking up your information.  Your physician wants you to follow-up in: 6 months with Dr Mallie Snooks will receive a reminder letter in the mail two months in advance. If you don't receive a letter, please call our office to schedule the follow-up appointment.

## 2014-01-08 NOTE — Progress Notes (Signed)
[place], [place],   [address] Phone: [phone] Fax:  [phone]  Date:  01/08/2014   ID:  Martin Hopkins, DOB 1951/05/04, MRN [id number]  PCP:  Vidal Schwalbe, MD  Sleep Medicine:  Fransico Him, MD   History of Present Illness: Martin Hopkins is a 63 y.o. male with a history of OSA, obesity and HTN who presents today for followup.  He is doing well with his CPAP therapy.  He tolerates his device well.  He tolerates the full face mask and feels the pressure is adequate.  He feels rested in the am and has no daytime sleepiness.  He says he has put on some weight over the winter. He walks some on the treadmill and once weekly outside for 3 miles.     Wt Readings from Last 3 Encounters:  01/08/14 234 lb 6.4 oz (106.323 kg)  09/28/11 230 lb (104.327 kg)  09/28/11 230 lb (104.327 kg)     Past Medical History  Diagnosis Date  . GERD (gastroesophageal reflux disease)     mild  . H/O: duodenal ulcer 1982  . Hypercholesteremia   . Hypothyroidism   . Allergic rhinitis   . Fatty liver   . ED (erectile dysfunction)   . Adenomatous polyp     Dr Cristina Gong  . Lumbar disc disease     L5--Dr Cay Schillings  . Hypogonadism male   . Hypertension   . OSA (obstructive sleep apnea)     AHI 40/hr now on CPAP 9cm H2O  . Obesity (BMI 30-39.9)     Current Outpatient Prescriptions  Medication Sig Dispense Refill  . ANDROGEL PUMP 20.25 MG/ACT (1.62%) GEL       . fluticasone (FLONASE) 50 MCG/ACT nasal spray Place 2 sprays into both nostrils as needed.       Marland Kitchen levothyroxine (SYNTHROID, LEVOTHROID) 175 MCG tablet Take 175 mcg by mouth daily before breakfast.      . Olopatadine HCl (PATANASE) 0.6 % SOLN Place into the nose as needed.      . simvastatin (ZOCOR) 40 MG tablet Take 1 tablet (40 mg total) by mouth at bedtime.  40 tablet  2   No current facility-administered medications for this visit.    Allergies:    Allergies  Allergen Reactions  . Penicillins Shortness Of Breath    . Lipitor [Atorvastatin]     Joint pain     Social History:  The patient  reports that he quit smoking about 23 years ago. He has never used smokeless tobacco. He reports that he drinks alcohol. He reports that he does not use illicit drugs.   Family History:  The patient's family history includes CAD in his father and paternal grandfather; Hypertension in his brother and mother; Peripheral vascular disease in his brother and father.   ROS:  Please see the history of present illness.      All other systems reviewed and negative.   PHYSICAL EXAM: VS:  BP 132/79  Pulse 91  Ht 5' 10.5" (1.791 m)  Wt 234 lb 6.4 oz (106.323 kg)  BMI 33.15 kg/m2 Well nourished, well developed, in no acute distress HEENT: normal Neck: no JVD Cardiac:  normal S1, S2; RRR; no murmur Lungs:  clear to auscultation bilaterally, no wheezing, rhonchi or rales Abd: soft, nontender, no hepatomegaly Ext: no edema Skin: warm and dry Neuro:  CNs 2-12 intact, no focal abnormalities noted       ASSESSMENT AND PLAN:  1.  OSA on CPAP and tolerating well - I will get a download from the DME 2. Obesity - I encouraged him to increase his exercise time and watch a strict diet 3. HTN - well controlled   Followup with me in 6 months  Signed, Fransico Him, MD 01/08/2014 1:44 PM

## 2014-01-08 NOTE — Telephone Encounter (Signed)
New problem   Martin Hopkins was returning your call.

## 2014-06-06 ENCOUNTER — Ambulatory Visit (INDEPENDENT_AMBULATORY_CARE_PROVIDER_SITE_OTHER): Payer: BC Managed Care – PPO | Admitting: Urology

## 2014-06-06 DIAGNOSIS — E669 Obesity, unspecified: Secondary | ICD-10-CM

## 2014-06-06 DIAGNOSIS — E291 Testicular hypofunction: Secondary | ICD-10-CM

## 2014-06-06 DIAGNOSIS — N529 Male erectile dysfunction, unspecified: Secondary | ICD-10-CM

## 2014-07-02 ENCOUNTER — Encounter: Payer: Self-pay | Admitting: Cardiology

## 2014-09-02 ENCOUNTER — Ambulatory Visit: Payer: BC Managed Care – PPO | Admitting: Cardiology

## 2014-09-29 ENCOUNTER — Encounter: Payer: Self-pay | Admitting: Cardiology

## 2014-09-29 ENCOUNTER — Ambulatory Visit (INDEPENDENT_AMBULATORY_CARE_PROVIDER_SITE_OTHER): Payer: BC Managed Care – PPO | Admitting: Cardiology

## 2014-09-29 VITALS — BP 126/88 | HR 88 | Ht 70.5 in | Wt 246.0 lb

## 2014-09-29 DIAGNOSIS — G4733 Obstructive sleep apnea (adult) (pediatric): Secondary | ICD-10-CM

## 2014-09-29 DIAGNOSIS — R0602 Shortness of breath: Secondary | ICD-10-CM

## 2014-09-29 DIAGNOSIS — E669 Obesity, unspecified: Secondary | ICD-10-CM

## 2014-09-29 DIAGNOSIS — I10 Essential (primary) hypertension: Secondary | ICD-10-CM

## 2014-09-29 DIAGNOSIS — R079 Chest pain, unspecified: Secondary | ICD-10-CM

## 2014-09-29 NOTE — Progress Notes (Signed)
[place], [place], [place]  [address] Phone: [phone] Fax:  [phone]  Date:  09/29/2014   ID:  Martin Hopkins, DOB [birth year], MRN [id number]  PCP:  Vidal Schwalbe, MD  Cardiologist:  Fransico Him, MD    History of Present Illness: Martin Hopkins is a 63 y.o. male with a history of OSA, obesity and HTN who presents today for followup. He is doing well with his CPAP therapy. He tolerates his device well. He tolerates the full face mask and feels the pressure is adequate. He has some nasal congestion which is a chronic thing for him and he has had for years.  He uses Afrin on occasion.   He feels rested in the am and has no daytime sleepiness. He says he has put on some weight since I saw him last. He has plantar fasciitis so it limits his exercise.  He says that he has had 2 episodes of chest pain at night that are sharp.  It is not worsened by deep breathing or movement.  There is no radiation of the pain and he denies any SOB associated with it.  He has noticed some DOE.     Wt Readings from Last 3 Encounters:  09/29/14 246 lb (111.585 kg)  01/08/14 234 lb 6.4 oz (106.323 kg)  09/28/11 230 lb (104.327 kg)     Past Medical History  Diagnosis Date  . GERD (gastroesophageal reflux disease)     mild  . H/O: duodenal ulcer 1982  . Hypercholesteremia   . Hypothyroidism   . Allergic rhinitis   . Fatty liver   . ED (erectile dysfunction)   . Adenomatous polyp     Dr Cristina Gong  . Lumbar disc disease     L5--Dr Cay Schillings  . Hypogonadism male   . Hypertension   . OSA (obstructive sleep apnea)     AHI 40/hr now on CPAP 9cm H2O  . Obesity (BMI 30-39.9)     Current Outpatient Prescriptions  Medication Sig Dispense Refill  . levothyroxine (SYNTHROID, LEVOTHROID) 175 MCG tablet Take 175 mcg by mouth daily before breakfast.    . simvastatin (ZOCOR) 40 MG tablet Take 40 mg by mouth every evening.  6   No current facility-administered medications for this visit.     Allergies:    Allergies  Allergen Reactions  . Penicillins Shortness Of Breath  . Lipitor [Atorvastatin]     Joint pain     Social History:  The patient  reports that he quit smoking about 23 years ago. He has never used smokeless tobacco. He reports that he drinks alcohol. He reports that he does not use illicit drugs.   Family History:  The patient's family history includes CAD in his father and paternal grandfather; Hypertension in his brother and mother; Peripheral vascular disease in his brother and father.   ROS:  Please see the history of present illness.      All other systems reviewed and negative.   PHYSICAL EXAM: VS:  BP 126/88 mmHg  Pulse 88  Ht 5' 10.5" (1.791 m)  Wt 246 lb (111.585 kg)  BMI 34.79 kg/m2  SpO2 98% Well nourished, well developed, in no acute distress HEENT: normal Neck: no JVD Cardiac:  normal S1, S2; RRR; no murmur Lungs:  clear to auscultation bilaterally, no wheezing, rhonchi or rales Abd: soft, nontender, no hepatomegaly Ext: no edema Skin: warm and dry Neuro:  CNs 2-12 intact, no focal abnormalities noted  EKG:  NSR with no ST changes     ASSESSMENT AND PLAN:  1. OSA on CPAP and tolerating well -He will bring a download card tomorrow am 2. Obesity - I encouraged him to increase his exercise time and watch a strict diet.  I have stressed the importance of trying to lose the weight.   3. HTN - well controlled 4. Atypical CP that only occurs at night but he has had some DOE.  He has several CRF including family history of CAD, HTN and obesity.  His EKG is normal.  I will set him up for an ETT.  I suspect that the DOE is from obesity and sedentary lifestyle.  Followup with me in 6 months   Signed, Fransico Him, MD [facility] 09/29/2014 8:49 AM

## 2014-09-29 NOTE — Patient Instructions (Addendum)
Your physician recommends that you continue on your current medications as directed. Please refer to the Current Medication list given to you today.  Your physician has requested that you have an exercise tolerance test. For further information please visit [email or web address]. Please also follow instruction sheet, as given.  Your physician wants you to follow-up in: 6 months with Dr. Radford Pax. You will receive a reminder letter in the mail two months in advance. If you don't receive a letter, please call our office to schedule the follow-up appointment.

## 2014-09-30 ENCOUNTER — Other Ambulatory Visit: Payer: Self-pay

## 2014-09-30 DIAGNOSIS — G4733 Obstructive sleep apnea (adult) (pediatric): Secondary | ICD-10-CM

## 2014-10-29 ENCOUNTER — Encounter: Payer: BC Managed Care – PPO | Admitting: Nurse Practitioner

## 2014-11-25 ENCOUNTER — Encounter: Payer: Self-pay | Admitting: Cardiology

## 2014-12-30 ENCOUNTER — Encounter: Payer: Self-pay | Admitting: Cardiology

## 2015-04-06 NOTE — Progress Notes (Signed)
Cardiology Office Note   Date:  04/07/2015   ID:  Martin Hopkins, DOB 29-Jun-1951, MRN [id number]  PCP:  Vidal Schwalbe, MD    Chief Complaint  Patient presents with  . Follow-up    OSA      History of Present Illness: Martin Hopkins is a 64 y.o. male with a history of OSA, obesity and HTN who presents today for followup. He is doing well with his CPAP therapy. He tolerates his device well. He tolerates the full face mask and feels the pressure is adequate. He has some nasal congestion which is a chronic thing for him and he has had for years. He uses Afrin on occasion.He sleeps throuh the night.  He does not sleep on his back.  He feels rested in the am and has no daytime sleepiness.  He has lost 16 lbs since I saw him in November.    Past Medical History  Diagnosis Date  . GERD (gastroesophageal reflux disease)     mild  . H/O: duodenal ulcer 1982  . Hypercholesteremia   . Hypothyroidism   . Allergic rhinitis   . Fatty liver   . ED (erectile dysfunction)   . Adenomatous polyp     Dr Cristina Gong  . Lumbar disc disease     L5--Dr Cay Schillings  . Hypogonadism male   . Hypertension   . OSA (obstructive sleep apnea)     AHI 40/hr now on CPAP 9cm H2O  . Obesity (BMI 30-39.9)     Past Surgical History  Procedure Laterality Date  . Other surgical history  1972    nasal surgery   . Other surgical history  2011    right trigger finger surgey   . Lumbar laminectomy  09/28/2011    Procedure: MICRODISCECTOMY LUMBAR LAMINECTOMY;  Surgeon: Tobi Bastos;  Location: [facility];  Service: Orthopedics;  Laterality: Left;  Hemi-Laminectomy, Microdiscectomy  Lumbar 5 - Sacral 1 Left    . Nasal fracture surgery  10/1971  . Vasectomy    . Right hand surgery      remove knots     Current Outpatient Prescriptions  Medication Sig Dispense Refill  . levothyroxine (SYNTHROID, LEVOTHROID) 175 MCG tablet Take 175 mcg by mouth daily before breakfast.    . simvastatin (ZOCOR) 40  MG tablet Take 40 mg by mouth every evening.  6   No current facility-administered medications for this visit.    Allergies:   Penicillins and Lipitor    Social History:  The patient  reports that he quit smoking about 24 years ago. He has never used smokeless tobacco. He reports that he drinks alcohol. He reports that he does not use illicit drugs.   Family History:  The patient's family history includes CAD in his father and paternal grandfather; Hypertension in his brother and mother; Peripheral vascular disease in his brother and father.    ROS:  Please see the history of present illness.   Otherwise, review of systems are positive for none.   All other systems are reviewed and negative.    PHYSICAL EXAM: VS:  BP 124/80 mmHg  Pulse 92  Ht 5' 10.5" (1.791 m)  Wt 230 lb 12.8 oz (104.69 kg)  BMI 32.64 kg/m2  SpO2 97% , BMI Body mass index is 32.64 kg/(m^2). GEN: Well nourished, well developed, in no acute distress HEENT: normal Neck: no JVD, carotid bruits, or masses Cardiac: RRR; no  murmurs, rubs, or gallops,no edema  Respiratory:  clear to auscultation bilaterally, normal work of breathing GI: soft, nontender, nondistended, + BS MS: no deformity or atrophy Skin: warm and dry, no rash Neuro:  Strength and sensation are intact Psych: euthymic mood, full affect   EKG:  EKG is not ordered today.    Recent Labs: No results found for requested labs within last 365 days.    Lipid Panel No results found for: CHOL, TRIG, HDL, CHOLHDL, VLDL, LDLCALC, LDLDIRECT    Wt Readings from Last 3 Encounters:  04/07/15 230 lb 12.8 oz (104.69 kg)  09/29/14 246 lb (111.585 kg)  01/08/14 234 lb 6.4 oz (106.323 kg)    ASSESSMENT AND PLAN:  1. OSA on CPAP and tolerating well but d/l today showed an elevated AHI at 18/hr on 9cm H2O.  He says that his mask has been lose and he thinks that sometimes he does not turn off the machine when he takes the mask off.  He is going to tighten his  mask and will make sure he turns off the machine when he removes his mask and I will get a d/l in 4 weeks 2. Obesity -I congratulated him on his weight loss and he will continue with his exercise and weight loss program 3. HTN - well controlled    Current medicines are reviewed at length with the patient today.  The patient does not have concerns regarding medicines.  The following changes have been made:  no change  Labs/ tests ordered today: See above Assessment and Plan No orders of the defined types were placed in this encounter.     Disposition:   FU with me in 1 year  Signed, Sueanne Margarita, MD  04/07/2015 9:30 AM    [facility] [place], [place], [place]  [address] Phone: [phone]; Fax: [phone]

## 2015-04-07 ENCOUNTER — Encounter: Payer: Self-pay | Admitting: Cardiology

## 2015-04-07 ENCOUNTER — Ambulatory Visit (INDEPENDENT_AMBULATORY_CARE_PROVIDER_SITE_OTHER): Payer: 59 | Admitting: Cardiology

## 2015-04-07 VITALS — BP 124/80 | HR 92 | Ht 70.5 in | Wt 230.8 lb

## 2015-04-07 DIAGNOSIS — E669 Obesity, unspecified: Secondary | ICD-10-CM

## 2015-04-07 DIAGNOSIS — G4733 Obstructive sleep apnea (adult) (pediatric): Secondary | ICD-10-CM

## 2015-04-07 DIAGNOSIS — I10 Essential (primary) hypertension: Secondary | ICD-10-CM

## 2015-04-07 NOTE — Patient Instructions (Signed)

## 2015-04-14 DIAGNOSIS — M5137 Other intervertebral disc degeneration, lumbosacral region: Secondary | ICD-10-CM | POA: Insufficient documentation

## 2015-04-17 ENCOUNTER — Encounter: Payer: Self-pay | Admitting: Cardiology

## 2015-05-13 ENCOUNTER — Encounter: Payer: Self-pay | Admitting: Cardiology

## 2015-05-26 ENCOUNTER — Encounter: Payer: Self-pay | Admitting: Cardiology

## 2015-07-17 ENCOUNTER — Telehealth: Payer: Self-pay | Admitting: *Deleted

## 2015-07-17 ENCOUNTER — Encounter: Payer: Self-pay | Admitting: Cardiology

## 2015-07-17 DIAGNOSIS — G4733 Obstructive sleep apnea (adult) (pediatric): Secondary | ICD-10-CM

## 2015-07-17 NOTE — Telephone Encounter (Signed)
Patient is aware. Orders placed, [place] notified.

## 2015-07-17 NOTE — Telephone Encounter (Signed)
-----   Message from Sueanne Margarita, MD sent at 07/17/2015  2:06 PM EDT ----- AHI too high - please get a 2 week autotitration from 4 to 20cm H2O

## 2015-09-11 ENCOUNTER — Encounter: Payer: Self-pay | Admitting: Cardiology

## 2015-09-18 ENCOUNTER — Telehealth: Payer: Self-pay | Admitting: *Deleted

## 2015-09-18 DIAGNOSIS — G4733 Obstructive sleep apnea (adult) (pediatric): Secondary | ICD-10-CM

## 2015-09-18 NOTE — Telephone Encounter (Signed)
Patient is aware and agrees to treatment plan.  Orders have been placed, [place] notified.

## 2015-09-18 NOTE — Telephone Encounter (Signed)
-----   Message from Sueanne Margarita, MD sent at 09/13/2015  7:44 PM EST ----- Set CPAP at 14cm H2O and get d/l in 2 weeks

## 2015-10-08 HISTORY — PX: OTHER SURGICAL HISTORY: SHX169

## 2015-10-23 ENCOUNTER — Other Ambulatory Visit: Payer: Self-pay | Admitting: Gastroenterology

## 2016-05-17 ENCOUNTER — Ambulatory Visit
Admission: RE | Admit: 2016-05-17 | Discharge: 2016-05-17 | Disposition: A | Discharge status: Home / Self Care | Payer: Managed Care, Other (non HMO) | Source: Ambulatory Visit | Attending: Family Medicine | Admitting: Family Medicine

## 2016-05-17 ENCOUNTER — Other Ambulatory Visit: Payer: Self-pay | Admitting: Family Medicine

## 2016-05-17 DIAGNOSIS — R0609 Other forms of dyspnea: Principal | ICD-10-CM

## 2016-05-17 DIAGNOSIS — R0600 Dyspnea, unspecified: Secondary | ICD-10-CM

## 2016-05-20 ENCOUNTER — Encounter: Payer: Self-pay | Admitting: Cardiology

## 2016-05-20 ENCOUNTER — Ambulatory Visit (INDEPENDENT_AMBULATORY_CARE_PROVIDER_SITE_OTHER): Payer: Managed Care, Other (non HMO) | Admitting: Cardiology

## 2016-05-20 VITALS — BP 140/76 | HR 88 | Ht 70.5 in | Wt 246.4 lb

## 2016-05-20 DIAGNOSIS — G4733 Obstructive sleep apnea (adult) (pediatric): Secondary | ICD-10-CM

## 2016-05-20 DIAGNOSIS — I10 Essential (primary) hypertension: Secondary | ICD-10-CM

## 2016-05-20 DIAGNOSIS — E669 Obesity, unspecified: Secondary | ICD-10-CM | POA: Diagnosis not present

## 2016-05-20 NOTE — Patient Instructions (Signed)
Medication Instructions:  Your physician recommends that you continue on your current medications as directed. Please refer to the Current Medication list given to you today.   Labwork: None  Testing/Procedures: None  Follow-Up: Your physician wants you to follow-up in: 1 year with Dr. Radford Pax. You will receive a reminder letter in the mail two months in advance. If you don't receive a letter, please call our office to schedule the follow-up appointment.   Any Other Special Instructions Will Be Listed Below (If Applicable). You will have a CPAP titration. We will call you to set the new pressure after the titration.  Romelle Starcher is our office PAP assistant. Her direct number is [phone]. Please call if you have any questions or concerns regarding your PAP.    If you need a refill on your cardiac medications before your next appointment, please call your pharmacy.

## 2016-05-20 NOTE — Progress Notes (Signed)
Cardiology Office Note    Date:  05/20/2016   ID:  SHRI ENFINGER, DOB [birth year], MRN [id number]  PCP:  Vidal Schwalbe, MD  Cardiologist:  Fransico Him, MD   Chief Complaint  Patient presents with  . Sleep Apnea  . Hypertension    History of Present Illness:  Martin Hopkins is a 65 y.o. male with a history of OSA, obesity and HTN who presents today for followup. He is doing well with his CPAP therapy. He tolerates his device well. He tolerates the full face mask and feels the pressure is adequate. He feels rested in the am and has no daytime sleepiness. He has gained 16lbs back since I saw him back.  He had been using the elliptical but tore the meniscus in his knee and now cannot exercise until he has surgery to get it fixed.     Past Medical History  Diagnosis Date  . GERD (gastroesophageal reflux disease)     mild  . H/O: duodenal ulcer 1982  . Hypercholesteremia   . Hypothyroidism   . Allergic rhinitis   . Fatty liver   . ED (erectile dysfunction)   . Adenomatous polyp     Dr Cristina Gong  . Lumbar disc disease     L5--Dr Cay Schillings  . Hypogonadism male   . Hypertension   . OSA (obstructive sleep apnea)     AHI 40/hr now on CPAP 9cm H2O  . Obesity (BMI 30-39.9)     Past Surgical History  Procedure Laterality Date  . Other surgical history  1972    nasal surgery   . Other surgical history  2011    right trigger finger surgey   . Lumbar laminectomy  09/28/2011    Procedure: MICRODISCECTOMY LUMBAR LAMINECTOMY;  Surgeon: Tobi Bastos;  Location: [facility];  Service: Orthopedics;  Laterality: Left;  Hemi-Laminectomy, Microdiscectomy  Lumbar 5 - Sacral 1 Left    . Nasal fracture surgery  10/1971  . Vasectomy    . Right hand surgery      remove knots    Current Medications: Outpatient Prescriptions Prior to Visit  Medication Sig Dispense Refill  . levothyroxine (SYNTHROID, LEVOTHROID) 175 MCG tablet Take 175 mcg by mouth daily before breakfast.    . simvastatin  (ZOCOR) 40 MG tablet Take 40 mg by mouth every evening.  6   No facility-administered medications prior to visit.     Allergies:   Penicillins and Lipitor   Social History   Social History  . Marital Status: Married    Spouse Name: N/A  . Number of Children: N/A  . Years of Education: N/A   Social History Main Topics  . Smoking status: Former Smoker    Quit date: 11/07/1990  . Smokeless tobacco: Never Used  . Alcohol Use: 0.0 oz/week     Comment: rare  . Drug Use: No  . Sexual Activity: Not Asked   Other Topics Concern  . None   Social History Narrative     Family History:  The patient's family history includes CAD in his father and paternal grandfather; Hypertension in his brother and mother; Peripheral vascular disease in his brother and father.   ROS:   Please see the history of present illness.    ROS All other systems reviewed and are negative.   PHYSICAL EXAM:   VS:  BP 140/76 mmHg  Pulse 88  Ht 5' 10.5" (1.791 m)  Wt 246 lb 6.4 oz (111.766 kg)  BMI  34.84 kg/m2  SpO2 97%   GEN: Well nourished, well developed, in no acute distress HEENT: normal Neck: no JVD, carotid bruits, or masses Cardiac: RRR; no murmurs, rubs, or gallops,no edema.  Intact distal pulses bilaterally.  Respiratory:  clear to auscultation bilaterally, normal work of breathing GI: soft, nontender, nondistended, + BS MS: no deformity or atrophy Skin: warm and dry, no rash Neuro:  Alert and Oriented x 3, Strength and sensation are intact Psych: euthymic mood, full affect  Wt Readings from Last 3 Encounters:  05/20/16 246 lb 6.4 oz (111.766 kg)  04/07/15 230 lb 12.8 oz (104.69 kg)  09/29/14 246 lb (111.585 kg)      Studies/Labs Reviewed:   EKG:  EKG is not ordered today.   Recent Labs: No results found for requested labs within last 365 days.   Lipid Panel No results found for: CHOL, TRIG, HDL, CHOLHDL, VLDL, LDLCALC, LDLDIRECT  Additional studies/ records that were reviewed  today include:  CPAP download    ASSESSMENT:    1. OSA (obstructive sleep apnea)   2. Essential hypertension, benign   3. Obesity (BMI 30-39.9)      PLAN:  In order of problems listed above:  OSA - the patient is tolerating PAP therapy well without any problems. The PAP download was reviewed today and showed an AHI of 13.8/hr on 9 cm H2O with 73% compliance in using more than 4 hours nightly.  The patient has been using and benefiting from CPAP use and will continue to benefit from therapy. His AHI has increased I suspect due to weight gain.  I will get a 2 week autotitration from 4 to 18cm H2O.   HTN - BP controlled. Obesity - I have encouraged him to get into a routine exercise program and cut back on carbs and portions.     Medication Adjustments/Labs and Tests Ordered: Current medicines are reviewed at length with the patient today.  Concerns regarding medicines are outlined above.  Medication changes, Labs and Tests ordered today are listed in the Patient Instructions below.  There are no Patient Instructions on file for this visit.   Signed, Fransico Him, MD  05/20/2016 2:35 PM    [facility] [place], [place], [place]  [address] Phone: [phone]; Fax: [phone]

## 2016-05-30 ENCOUNTER — Encounter: Payer: Self-pay | Admitting: Cardiology

## 2016-12-30 ENCOUNTER — Telehealth: Payer: Self-pay | Admitting: Cardiology

## 2016-12-30 DIAGNOSIS — G4733 Obstructive sleep apnea (adult) (pediatric): Secondary | ICD-10-CM

## 2016-12-30 NOTE — Telephone Encounter (Signed)
Supply order sent 12/30/16

## 2016-12-30 NOTE — Telephone Encounter (Signed)
New Message  Pt voiced he is needing materials to order and would like to speak to someone in regards to what materials he is needing.  Please f/u with pt

## 2017-06-16 ENCOUNTER — Telehealth: Payer: Self-pay | Admitting: Cardiology

## 2017-06-16 NOTE — Telephone Encounter (Signed)
Patient states about 2-3 weeks ago, he experienced some discomfort in his neck/base of skull. He had a sharp, quick pain in his chest and L arm at the same time. The discomfort occurs at rest and on exertion, but relaxation seems to make a little better. Since then, the same symptoms have occurred intermittently. He denies SOB, nausea, sweating. Earlier in the year, he lost about 14 pounds and then got injured. He has recently gained most of his weight back. With his weight came worsening indigestion.  He reports he has also been really stressed during the last month or so. He is currently asymptomatic and says he feels fine. In 2015, a GXT was ordered but was never completed. Since there are no APP openings for over a week, scheduled patient for evaluation with Dr. Acie Fredrickson, DOD, on 8/14. Patient understands to seek immediate medical attention if symptoms worsen prior to OV.  He was grateful for call and agrees with treatment plan.

## 2017-06-16 NOTE — Telephone Encounter (Signed)
Left message to call back  

## 2017-06-16 NOTE — Telephone Encounter (Signed)
New message      Pt c/o of Chest Pain: STAT if CP now or developed within 24 hours  1. Are you having CP right now?  no  2. Are you experiencing any other symptoms (ex. SOB, nausea, vomiting, sweating)?  Pain in back of neck and arm pain  3. How long have you been experiencing CP?  2 weeks ago, but a little worse yesterday 4. Is your CP continuous or coming and going?  continuous  5. Have you taken Nitroglycerin?  No, but pt has taken his acid reflux medication

## 2017-06-20 ENCOUNTER — Ambulatory Visit: Payer: Managed Care, Other (non HMO) | Admitting: Cardiovascular Disease

## 2017-07-09 NOTE — Progress Notes (Signed)
Cardiology Office Note    Date:  07/11/2017  ID:  Martin Hopkins, DOB [birth year], MRN [id number] PCP:  Harlan Stains, MD  Cardiologist:  Dr. Radford Pax   Chief Complaint: chest pain  History of Present Illness:  Martin Hopkins is a 66 y.o. male with history of OSA (followed by Dr. Radford Pax), HTN, HLD, obesity, GERD, duodenal ulcer, lumbar disc disease who presents for evaluation of chest pain. Last seen 05/2016 by Dr. Radford Pax for OSA f/u. No prior formal cardiac hx. ETT 2009 was normal. He had ETT scheduled in 2015 for eval of recurrent CP but never completed - patient cites work schedule. Labs are followed by primary care with recent Tchol 155, HDL 36, LDL 93, trig 131, Hgb 15.2, Cr 0.930, K 4.7, AST 27, TSH 0.150, Plt 203 on 06/23/17.4 per POC report.  He recently called in reporting recurrent chest pain. Over the last 6 months he has noticed occasional sensation of sharp chest pain in his chest as well as discomfort in his neck and base of skull with exertion. He is quite active, walking 8500-12000 steps per day for his job and states he normally does not have any issues in doing so. However about once a week he will feel the above symptoms coming on, particularly if he had not been active very much earlier that day. He usually will continue to "walk through it" and continue exerting himself until it goes away. No SOB, nausea, vomiting, claminess, dizziness, palpitations or syncope. He has not had any rest pain. He used to be on baby aspirin but got away from it simply out of convenience - no prior bleeding issues. Reports generally good compliance with CPAP. He is aware he's been gaining weight and eating poorly. He tore his meniscus a while back so this has set his activity back slightly but he states he's still able to do a large amount of daily walking as above.  Past Medical History:  Diagnosis Date  . Adenomatous polyp    Dr Cristina Gong  . Allergic rhinitis   . ED (erectile dysfunction)   . Fatty liver    . GERD (gastroesophageal reflux disease)    mild  . H/O: duodenal ulcer 1982  . Hypercholesteremia   . Hypertension   . Hypogonadism male   . Hypothyroidism   . Lumbar disc disease    L5--Dr Cay Schillings  . Obesity (BMI 30-39.9)   . OSA (obstructive sleep apnea)    AHI 40/hr now on CPAP 9cm H2O    Past Surgical History:  Procedure Laterality Date  . LUMBAR LAMINECTOMY  09/28/2011   Procedure: MICRODISCECTOMY LUMBAR LAMINECTOMY;  Surgeon: Tobi Bastos;  Location: [facility];  Service: Orthopedics;  Laterality: Left;  Hemi-Laminectomy, Microdiscectomy  Lumbar 5 - Sacral 1 Left    . NASAL FRACTURE SURGERY  10/1971  . OTHER SURGICAL HISTORY  1972   nasal surgery   . OTHER SURGICAL HISTORY  2011   right trigger finger surgey   . right hand surgery     remove knots  . VASECTOMY      Current Medications: Current Meds  Medication Sig  . levothyroxine (SYNTHROID, LEVOTHROID) 175 MCG tablet Take 175 mcg by mouth daily before breakfast.  . simvastatin (ZOCOR) 40 MG tablet Take 40 mg by mouth every evening.     Allergies:   Penicillins and Lipitor [atorvastatin]   Social History   Social History  . Marital status: Married    Spouse name: N/A  .  Number of children: N/A  . Years of education: N/A   Social History Main Topics  . Smoking status: Former Smoker    Quit date: 11/07/1990  . Smokeless tobacco: Never Used  . Alcohol use 0.0 oz/week     Comment: rare  . Drug use: No  . Sexual activity: Not Asked   Other Topics Concern  . None   Social History Narrative  . None     Family History:  Family History  Problem Relation Age of Onset  . Hypertension Mother   . CAD Father   . Peripheral vascular disease Father   . Peripheral vascular disease Brother   . Hypertension Brother   . CAD Paternal Grandfather     ROS:   Please see the history of present illness. All other systems are reviewed and otherwise negative.    PHYSICAL EXAM:   VS:  BP 132/82   Pulse 84    Ht 5\' 10"  (1.778 m)   Wt 241 lb (109.3 kg)   BMI 34.58 kg/m   BMI: Body mass index is 34.58 kg/m. GEN: Well nourished, well developed obese WM in no acute distress  HEENT: normocephalic, atraumatic Neck: no JVD, carotid bruits, or masses Cardiac: RRR; no murmurs, rubs, or gallops, no edema  Respiratory:  clear to auscultation bilaterally, normal work of breathing GI: soft, nontender, nondistended, + BS MS: no deformity or atrophy  Skin: warm and dry, no rash Neuro:  Alert and Oriented x 3, Strength and sensation are intact, follows commands Psych: euthymic mood, full affect  Wt Readings from Last 3 Encounters:  07/11/17 241 lb (109.3 kg)  05/20/16 246 lb 6.4 oz (111.8 kg)  04/07/15 230 lb 12.8 oz (104.7 kg)      Studies/Labs Reviewed:   EKG:  EKG was ordered today and personally reviewed by me and demonstrates NSR 84bpm, no acute ST-T Changes.  Recent Labs: No results found for requested labs within last 8760 hours.   Lipid Panel No results found for: CHOL, TRIG, HDL, CHOLHDL, VLDL, LDLCALC, LDLDIRECT  Additional studies/ records that were reviewed today include: Summarized above    ASSESSMENT & PLAN:   1. Atypical chest pain - mixed atypical/typical features. Risk factors include HLD, HTN, obesity, family hx of CAD (father). EKG is reassuring at baseline. Recent labs unremarkable by PCP - will request full copy as we do not have reference ranges listed. Will plan on ETT as previously recommended. I recommended to resume baby aspirin while this is being evaluated. Warning sx reviewed. 2. HTN - BP mildly above goal given recent guidelines. He reports generally controlled from 403-474 systolic outside the office. Will follow BP response to exercise to determine next appropriate action for this. 3. Hyperlipidemia - followed by PCP, on simvastatin. 4. OSA - he brought in his download today; information will go to Dr. Radford Pax for review. He is overdue for his 1 year sleep f/u so  will arrange follow-up.  Disposition: I originally planned f/u 3-4 weeks with me but in talking to Dr. Theodosia Blender nurse, he is actually overdue for visit with Dr. Radford Pax for OSA (last visit 05/2016). Will instead try to arrange his follow-up appointment with her, but will plan to see him back sooner if any concerns on the ETT.  Medication Adjustments/Labs and Tests Ordered: Current medicines are reviewed at length with the patient today.  Concerns regarding medicines are outlined above. Medication changes, Labs and Tests ordered today are summarized above and listed in the Patient Instructions  accessible in Encounters.   Signed, Charlie Pitter, PA-C  07/11/2017 9:02 AM    [facility] [place], [place], [place]  [address] Phone: [phone]; Fax: [phone]

## 2017-07-11 ENCOUNTER — Encounter: Payer: Self-pay | Admitting: Physician Assistant

## 2017-07-11 ENCOUNTER — Telehealth: Payer: Self-pay | Admitting: Physician Assistant

## 2017-07-11 ENCOUNTER — Encounter: Payer: Self-pay | Admitting: Cardiology

## 2017-07-11 ENCOUNTER — Ambulatory Visit (INDEPENDENT_AMBULATORY_CARE_PROVIDER_SITE_OTHER): Payer: Managed Care, Other (non HMO) | Admitting: Physician Assistant

## 2017-07-11 VITALS — BP 132/82 | HR 84 | Ht 70.0 in | Wt 241.0 lb

## 2017-07-11 DIAGNOSIS — I10 Essential (primary) hypertension: Secondary | ICD-10-CM | POA: Diagnosis not present

## 2017-07-11 DIAGNOSIS — E785 Hyperlipidemia, unspecified: Secondary | ICD-10-CM

## 2017-07-11 DIAGNOSIS — G4733 Obstructive sleep apnea (adult) (pediatric): Secondary | ICD-10-CM | POA: Diagnosis not present

## 2017-07-11 DIAGNOSIS — R0789 Other chest pain: Secondary | ICD-10-CM | POA: Diagnosis not present

## 2017-07-11 MED ORDER — ASPIRIN EC 81 MG PO TBEC: 81.0000 mg | DELAYED_RELEASE_TABLET | Freq: Every day | ORAL | 3 refills | Status: DC

## 2017-07-11 NOTE — Telephone Encounter (Signed)
Labs from [facility] reviwed from 06/23/17 - WBC 7.2, Hgb 15.2, Hct 44.1, Plt 202, glucose 100, BUN 13, Cr 0.93, Na 140, K 4.7, Cl 105, CO2 27, TProt 7.8. Albumin 4.7, Tbili 0.7, ALP 64, AST 21, ALT 27, Tchol 155, trig 131, HDL 36, LDL 93. TSH 0.15 (ref range 0.34-4.50).  Can you contact Dr. Orest Dikes office and find out if they addressed his thyroid on these labs and what their plan was for follow-up? Per our [facility], his thyroid dose has not changed since 2015. Thanks! Dayna Dunn PA-C

## 2017-07-11 NOTE — Telephone Encounter (Signed)
[employer], spoke with Hildred Alamin, Dr. Orest Dikes Asst, and she advises that the pt has been made aware of his lab results. They decreased his Synthroid to 150mg  daily and they are repeating tsh in 8 weeks. Changed medication dose in EPIC.

## 2017-07-11 NOTE — Patient Instructions (Signed)
Medication Instructions:  Your physician has recommended you make the following change in your medication:  1.  START Aspirin 81 mg daily   Labwork: None ordered  Testing/Procedures: Your physician has requested that you have an exercise tolerance test. For further information please visit [email or web address]. Please also follow instruction sheet, as given.    Follow-Up: Your physician recommends that you schedule a follow-up appointment in: 3-4 WEEKS WITH DAYNA DUNN, PA-C   Any Other Special Instructions Will Be Listed Below (If Applicable).  Exercise Stress Electrocardiogram An exercise stress electrocardiogram is a test that is done to evaluate the blood supply to your heart. This test may also be called exercise stress electrocardiography. The test is done while you are walking on a treadmill. The goal of this test is to raise your heart rate. This test is done to find areas of poor blood flow to the heart by determining the extent of coronary artery disease (CAD). CAD is defined as narrowing in one or more heart (coronary) arteries of more than 70%. If you have an abnormal test result, this may mean that you are not getting adequate blood flow to your heart during exercise. Additional testing may be needed to understand why your test was abnormal. Tell a health care provider about:  Any allergies you have.  All medicines you are taking, including vitamins, herbs, eye drops, creams, and over-the-counter medicines.  Any problems you or family members have had with anesthetic medicines.  Any blood disorders you have.  Any surgeries you have had.  Any medical conditions you have.  Possibility of pregnancy, if this applies. What are the risks? Generally, this is a safe procedure. However, as with any procedure, complications can occur. Possible complications can include:  Pain or pressure in the following areas: ? Chest. ? Jaw or neck. ? Between your shoulder  blades. ? Radiating down your left arm.  Dizziness or light-headedness.  Shortness of breath.  Increased or irregular heartbeats.  Nausea or vomiting.  Heart attack (rare).  What happens before the procedure?  Avoid all forms of caffeine 24 hours before your test or as directed by your health care provider. This includes coffee, tea (even decaffeinated tea), caffeinated sodas, chocolate, cocoa, and certain pain medicines.  Follow your health care provider's instructions regarding eating and drinking before the test.  Take your medicines as directed at regular times with water unless instructed otherwise. Exceptions may include: ? If you have diabetes, ask how you are to take your insulin or pills. It is common to adjust insulin dosing the morning of the test. ? If you are taking beta-blocker medicines, it is important to talk to your health care provider about these medicines well before the date of your test. Taking beta-blocker medicines may interfere with the test. In some cases, these medicines need to be changed or stopped 24 hours or more before the test. ? If you wear a nitroglycerin patch, it may need to be removed prior to the test. Ask your health care provider if the patch should be removed before the test.  If you use an inhaler for any breathing condition, bring it with you to the test.  If you are an outpatient, bring a snack so you can eat right after the stress phase of the test.  Do not smoke for 4 hours prior to the test or as directed by your health care provider.  Do not apply lotions, powders, creams, or oils on your chest prior to the test.  Wear loose-fitting clothes and comfortable shoes for the test. This test involves walking on a treadmill. What happens during the procedure?  Multiple patches (electrodes) will be put on your chest. If needed, small areas of your chest may have to be shaved to get better contact with the electrodes. Once the electrodes are  attached to your body, multiple wires will be attached to the electrodes and your heart rate will be monitored.  Your heart will be monitored both at rest and while exercising.  You will walk on a treadmill. The treadmill will be started at a slow pace. The treadmill speed and incline will gradually be increased to raise your heart rate. What happens after the procedure?  Your heart rate and blood pressure will be monitored after the test.  You may return to your normal schedule including diet, activities, and medicines, unless your health care provider tells you otherwise. This information is not intended to replace advice given to you by your health care provider. Make sure you discuss any questions you have with your health care provider. Document Released: 10/21/2000 Document Revised: 03/31/2016 Document Reviewed: 07/01/2013 Elsevier Interactive Patient Education  2017 [employer].     If you need a refill on your cardiac medications before your next appointment, please call your pharmacy.

## 2017-07-11 NOTE — Addendum Note (Signed)
Addended by: Gaetano Net on: 07/11/2017 09:56 AM   Modules accepted: Orders

## 2017-07-17 ENCOUNTER — Telehealth: Payer: Self-pay | Admitting: *Deleted

## 2017-07-17 NOTE — Telephone Encounter (Signed)
-----   Message from Sueanne Margarita, MD sent at 07/15/2017  6:03 AM EDT ----- Good AHI and compliance.  Continue current CPAP settings.

## 2017-07-17 NOTE — Telephone Encounter (Signed)
Informed patient of compliance results and verbalized understanding was indicated. Patient understands his events are in normal range. Patient understands his settings will not change. Patient was grateful for the call and thanked me.  

## 2017-08-08 ENCOUNTER — Ambulatory Visit: Payer: Managed Care, Other (non HMO) | Admitting: Physician Assistant

## 2017-08-10 ENCOUNTER — Emergency Department (HOSPITAL_COMMUNITY)
Admission: EM | Admit: 2017-08-10 | Discharge: 2017-08-11 | Discharge status: Against Medical Advice | Payer: Managed Care, Other (non HMO)

## 2017-08-10 NOTE — ED Notes (Signed)
Called pt, no response. 

## 2017-09-27 ENCOUNTER — Ambulatory Visit: Payer: Managed Care, Other (non HMO) | Admitting: Cardiology

## 2017-09-27 ENCOUNTER — Encounter: Payer: Self-pay | Admitting: Cardiology

## 2017-09-27 VITALS — BP 134/84 | HR 86 | Ht 70.0 in | Wt 251.4 lb

## 2017-09-27 DIAGNOSIS — G4733 Obstructive sleep apnea (adult) (pediatric): Secondary | ICD-10-CM | POA: Diagnosis not present

## 2017-09-27 DIAGNOSIS — E669 Obesity, unspecified: Secondary | ICD-10-CM | POA: Diagnosis not present

## 2017-09-27 DIAGNOSIS — R079 Chest pain, unspecified: Secondary | ICD-10-CM

## 2017-09-27 DIAGNOSIS — I10 Essential (primary) hypertension: Secondary | ICD-10-CM

## 2017-09-27 NOTE — Patient Instructions (Addendum)
Medication Instructions:  Your physician recommends that you continue on your current medications as directed. Please refer to the Current Medication list given to you today.  Labwork: None ordered   Testing/Procedures: Your physician has requested that you have en exercise stress myoview. For further information please visit [email or web address]. Please follow instruction sheet, as given.    Follow-Up: Your physician wants you to follow-up in: 1 year with Dr. Radford Pax. You will receive a reminder letter in the mail two months in advance. If you don't receive a letter, please call our office to schedule the follow-up appointment.  Any Other Special Instructions Will Be Listed Below (If Applicable).     If you need a refill on your cardiac medications before your next appointment, please call your pharmacy.

## 2017-09-27 NOTE — Progress Notes (Signed)
Cardiology Office Note:    Date:  09/27/2017   ID:  Martin Hopkins, DOB 07/28/1951, MRN [id number]  PCP:  Harlan Stains, MD  Cardiologist:  Fransico Him, MD   Referring MD: Harlan Stains, MD   Chief Complaint  Patient presents with  . Follow-up    OSA, HTN    History of Present Illness:    Martin Hopkins is a 66 y.o. male with a hx of OSA, obesity and HTN.  He is doing well with his CPAP device and thinks that he has gotten used to it.  He tolerates the mask and feels the pressure is adequate.  Since going on CPAP He feels rested in the am and has no significant daytime sleepiness.  He denies any significant mouth or nasal dryness or nasal congestion.  He does not think that he snores.    He was seen by Melina Copa, PA in September and was scheduled for a stress test due to chest pain but has had to cancel it several times.  He says that it comes and goes.  He can work out in the yard and not get any symptoms.  He will get pressure after sitting for a while and then get up very fast and resolves within a few minutes.  It occurs sporadically.  He has had to cut back on exercise due to his knee pain. The pain occasionally will go down his left arm but is not associated with nausea or diaphoresis.  He denies any SOB with the pain but does have some DOE but also has gained weight.    Past Medical History:  Diagnosis Date  . Adenomatous polyp    Dr Cristina Gong  . Allergic rhinitis   . ED (erectile dysfunction)   . Fatty liver   . GERD (gastroesophageal reflux disease)    mild  . H/O: duodenal ulcer 1982  . Hypercholesteremia   . Hypertension   . Hypogonadism male   . Hypothyroidism   . Lumbar disc disease    L5--Dr Cay Schillings  . Obesity (BMI 30-39.9)   . OSA (obstructive sleep apnea)    AHI 40/hr now on CPAP 9cm H2O    Past Surgical History:  Procedure Laterality Date  . LUMBAR LAMINECTOMY  09/28/2011   Procedure: MICRODISCECTOMY LUMBAR LAMINECTOMY;  Surgeon: Tobi Bastos;   Location: [facility];  Service: Orthopedics;  Laterality: Left;  Hemi-Laminectomy, Microdiscectomy  Lumbar 5 - Sacral 1 Left    . NASAL FRACTURE SURGERY  10/1971  . OTHER SURGICAL HISTORY  1972   nasal surgery   . OTHER SURGICAL HISTORY  2011   right trigger finger surgey   . right hand surgery     remove knots  . VASECTOMY      Current Medications: Current Meds  Medication Sig  . aspirin EC 81 MG tablet Take 1 tablet (81 mg total) by mouth daily.  Marland Kitchen levothyroxine (SYNTHROID, LEVOTHROID) 150 MCG tablet Take 150 mcg by mouth daily before breakfast.  . simvastatin (ZOCOR) 40 MG tablet Take 40 mg by mouth every evening.     Allergies:   Penicillins and Lipitor [atorvastatin]   Social History   Socioeconomic History  . Marital status: Married    Spouse name: None  . Number of children: None  . Years of education: None  . Highest education level: None  Social Needs  . Financial resource strain: None  . Food insecurity - worry: None  . Food insecurity - inability: None  .  Transportation needs - medical: None  . Transportation needs - non-medical: None  Occupational History  . None  Tobacco Use  . Smoking status: Former Smoker    Last attempt to quit: 11/07/1990    Years since quitting: 26.9  . Smokeless tobacco: Never Used  Substance and Sexual Activity  . Alcohol use: Yes    Alcohol/week: 0.0 oz    Comment: rare  . Drug use: No  . Sexual activity: None  Other Topics Concern  . None  Social History Narrative  . None     Family History: The patient's family history includes CAD in his father and paternal grandfather; Hypertension in his brother and mother; Peripheral vascular disease in his brother and father.  ROS:   Please see the history of present illness.    ROS  All other systems reviewed and negative.   EKGs/Labs/Other Studies Reviewed:    The following studies were reviewed today: CPAP download  EKG:  EKG is not ordered today.   Recent Labs: No results  found for requested labs within last 8760 hours.   Recent Lipid Panel No results found for: CHOL, TRIG, HDL, CHOLHDL, VLDL, LDLCALC, LDLDIRECT  Physical Exam:    VS:  BP 134/84   Pulse 86   Ht 5\' 10"  (1.778 m)   Wt 251 lb 6.4 oz (114 kg)   SpO2 95%   BMI 36.07 kg/m     Wt Readings from Last 3 Encounters:  09/27/17 251 lb 6.4 oz (114 kg)  07/11/17 241 lb (109.3 kg)  05/20/16 246 lb 6.4 oz (111.8 kg)     GEN:  Well nourished, well developed in no acute distress HEENT: Normal NECK: No JVD; No carotid bruits LYMPHATICS: No lymphadenopathy CARDIAC: RRR, no murmurs, rubs, gallops RESPIRATORY:  Clear to auscultation without rales, wheezing or rhonchi  ABDOMEN: Soft, non-tender, non-distended MUSCULOSKELETAL:  No edema; No deformity  SKIN: Warm and dry NEUROLOGIC:  Alert and oriented x 3 PSYCHIATRIC:  Normal affect   ASSESSMENT:    1. OSA (obstructive sleep apnea)   2. Essential hypertension, benign   3. Obesity (BMI 30-39.9)   4. Chest pain, unspecified type    PLAN:    In order of problems listed above:  1.  OSA - the patient is tolerating PAP therapy well without any problems. The PAP download was reviewed today and showed an AHI of 3.8 /hr on 9 cm H2O with 97% compliance in using more than 4 hours nightly.  The patient has been using and benefiting from CPAP use and will continue to benefit from therapy.   2.  HTN - BP is well controlled on exam today.  He is not on any BP meds.  3.  Obesity - I have encouraged him to get into a routine exercise program and cut back on carbs and portions.   4.  Chest pain - this is somewhat atypical and is sporadic.  He can do hard work in the yard and not have any problems.  He does have CRFs including obesity, HTN, hyperlipidemia, remote tobacco use and family history of CAD. I will get a nuclear stress test to rule out ischemia.    Medication Adjustments/Labs and Tests Ordered: Current medicines are reviewed at length with the  patient today.  Concerns regarding medicines are outlined above.  No orders of the defined types were placed in this encounter.  No orders of the defined types were placed in this encounter.   Signed, Fransico Him, MD  09/27/2017 10:55 AM    [facility]

## 2017-10-03 ENCOUNTER — Telehealth (HOSPITAL_COMMUNITY): Payer: Self-pay | Admitting: *Deleted

## 2017-10-03 NOTE — Telephone Encounter (Signed)
Patient given detailed instructions per Myocardial Perfusion Study Information Sheet for the test on 10/09/17 at 0945. Patient notified to arrive 15 minutes early and that it is imperative to arrive on time for appointment to keep from having the test rescheduled.  If you need to cancel or reschedule your appointment, please call the office within 24 hours of your appointment. . Patient verbalized understanding.Calen Geister, Ranae Palms

## 2017-10-09 ENCOUNTER — Ambulatory Visit (HOSPITAL_COMMUNITY): Payer: Managed Care, Other (non HMO) | Attending: Internal Medicine

## 2017-10-09 DIAGNOSIS — R079 Chest pain, unspecified: Secondary | ICD-10-CM | POA: Insufficient documentation

## 2017-10-09 MED ORDER — TECHNETIUM TC 99M TETROFOSMIN IV KIT
32.7000 | PACK | Freq: Once | INTRAVENOUS | Status: AC | PRN
  Administered 2017-10-09: 32.7 via INTRAVENOUS
  Filled 2017-10-09: qty 33

## 2017-10-09 MED ORDER — TECHNETIUM TC 99M TETROFOSMIN IV KIT
11.0000 | PACK | Freq: Once | INTRAVENOUS | Status: AC | PRN
  Administered 2017-10-09: 11 via INTRAVENOUS
  Filled 2017-10-09: qty 11

## 2017-10-10 LAB — MYOCARDIAL PERFUSION IMAGING
Estimated workload: 7.7 METS
Exercise duration (min): 6 min
LV dias vol: 91 mL (ref 62–150)
LV sys vol: 47 mL
MPHR: 157 {beats}/min
Peak HR: 157 {beats}/min
Percent HR: 100 %
RATE: 0.34
RPE: 17
Rest HR: 95 {beats}/min
SDS: 1
SRS: 0
SSS: 1
TID: 0.96

## 2017-10-11 NOTE — Progress Notes (Signed)
Normal stress test

## 2018-12-21 DIAGNOSIS — R740 Nonspecific elevation of levels of transaminase and lactic acid dehydrogenase [LDH]: Secondary | ICD-10-CM | POA: Diagnosis not present

## 2019-01-15 DIAGNOSIS — L570 Actinic keratosis: Secondary | ICD-10-CM | POA: Diagnosis not present

## 2019-01-15 DIAGNOSIS — C4442 Squamous cell carcinoma of skin of scalp and neck: Secondary | ICD-10-CM | POA: Diagnosis not present

## 2019-01-15 DIAGNOSIS — D485 Neoplasm of uncertain behavior of skin: Secondary | ICD-10-CM | POA: Diagnosis not present

## 2019-01-15 DIAGNOSIS — Z23 Encounter for immunization: Secondary | ICD-10-CM | POA: Diagnosis not present

## 2019-01-16 DIAGNOSIS — H40023 Open angle with borderline findings, high risk, bilateral: Secondary | ICD-10-CM | POA: Diagnosis not present

## 2019-01-16 DIAGNOSIS — H40053 Ocular hypertension, bilateral: Secondary | ICD-10-CM | POA: Diagnosis not present

## 2019-01-16 DIAGNOSIS — H5213 Myopia, bilateral: Secondary | ICD-10-CM | POA: Diagnosis not present

## 2019-02-14 DIAGNOSIS — B349 Viral infection, unspecified: Secondary | ICD-10-CM | POA: Diagnosis not present

## 2019-03-01 ENCOUNTER — Telehealth: Payer: Self-pay

## 2019-03-01 NOTE — Telephone Encounter (Signed)
Called and left non-detailed message about upcoming appointment. Need to get consent and make appointment into a video visit.

## 2019-03-03 NOTE — Progress Notes (Signed)
Virtual Visit via Video Note   This visit type was conducted due to national recommendations for restrictions regarding the COVID-19 Pandemic (e.g. social distancing) in an effort to limit this patient's exposure and mitigate transmission in our community.  Due to his co-morbid illnesses, this patient is at least at moderate risk for complications without adequate follow up.  This format is felt to be most appropriate for this patient at this time.  All issues noted in this document were discussed and addressed.  A limited physical exam was performed with this format.  Please refer to the patient's chart for his consent to telehealth for [facility]   Evaluation Performed:  Follow-up visit  This visit type was conducted due to national recommendations for restrictions regarding the COVID-19 Pandemic (e.g. social distancing).  This format is felt to be most appropriate for this patient at this time.  All issues noted in this document were discussed and addressed.  No physical exam was performed (except for noted visual exam findings with Video Visits).  Please refer to the patient's chart (MyChart message for video visits and phone note for telephone visits) for the patient's consent to telehealth for [facility].  Date:  03/04/2019   ID:  Martin Hopkins, DOB Sep 28, 1951, MRN [id number]  Patient Location:  Home  Provider location:   [place]  PCP:  Harlan Stains, MD  Cardiologist:  Fransico Him, MD Electrophysiologist:  None   Chief Complaint:  OSA, HTN  History of Present Illness:    Martin Hopkins is a 68 y.o. male who presents via Engineer, civil (consulting) for a telehealth visit today.    Martin Hopkins is a 68 y.o. male with a hx of OSA, obesity and HTN.  As pain a year ago a nuclear stress test showed no ischemia.He is doing well with his CPAP device and thinks that he has gotten used to it.  He tolerates the mask But feels the pressure may be a little too high. Since going on CPAP  he feels rested in the am and has no significant daytime sleepiness.  He denies any significant mouth or nasal dryness or nasal congestion.  Hw does not think that he snores.     He tells me that he has had some pressure on his chest that is more like a tightness 3-4 times in the past few weeks.  It is on the right side and there is no radiation of the discomfort.  The last time it happened he was standing in his truck and lasted about 15 minutes.  He said it made him feel tired and weak but there was no diaphoresis, nausea or shortness of breath.  He had a nuclear stress test a year ago that showed no ischemia.  He does have cardiac risk factors including a history of tobacco use, family history of CAD, hypertension and hyperlipidemia as well as obesity.  The patient does not have symptoms concerning for COVID-19 infection (fever, chills, cough, or new shortness of breath).    Prior CV studies:   The following studies were reviewed today:  PAP compliance download and nuclear stress test  Past Medical History:  Diagnosis Date  . Adenomatous polyp    Dr Cristina Gong  . Allergic rhinitis   . ED (erectile dysfunction)   . Fatty liver   . GERD (gastroesophageal reflux disease)    mild  . H/O: duodenal ulcer 1982  . Hypercholesteremia   . Hypertension   . Hypogonadism male   .  Hypothyroidism   . Lumbar disc disease    L5--Dr Cay Schillings  . Obesity (BMI 30-39.9)   . OSA (obstructive sleep apnea)    AHI 40/hr now on CPAP 9cm H2O   Past Surgical History:  Procedure Laterality Date  . LUMBAR LAMINECTOMY  09/28/2011   Procedure: MICRODISCECTOMY LUMBAR LAMINECTOMY;  Surgeon: Tobi Bastos;  Location: [facility];  Service: Orthopedics;  Laterality: Left;  Hemi-Laminectomy, Microdiscectomy  Lumbar 5 - Sacral 1 Left    . NASAL FRACTURE SURGERY  10/1971  . OTHER SURGICAL HISTORY  1972   nasal surgery   . OTHER SURGICAL HISTORY  2011   right trigger finger surgey   . right hand surgery     remove  knots  . VASECTOMY       Current Meds  Medication Sig  . aspirin EC 81 MG tablet Take 1 tablet (81 mg total) by mouth daily.  . cetirizine (ZYRTEC) 10 MG tablet Take 10 mg by mouth every other day.  . levothyroxine (SYNTHROID, LEVOTHROID) 150 MCG tablet Take 150 mcg by mouth daily before breakfast.  . simvastatin (ZOCOR) 40 MG tablet Take 40 mg by mouth every evening.     Allergies:   Penicillins and Lipitor [atorvastatin]   Social History   Tobacco Use  . Smoking status: Former Smoker    Last attempt to quit: 11/07/1990    Years since quitting: 28.3  . Smokeless tobacco: Never Used  Substance Use Topics  . Alcohol use: Yes    Comment: rare  . Drug use: No     Family Hx: The patient's family history includes CAD in his father and paternal grandfather; Hypertension in his brother and mother; Peripheral vascular disease in his brother and father.  ROS:   Please see the history of present illness.     All other systems reviewed and are negative.   Labs/Other Tests and Data Reviewed:    Recent Labs: No results found for requested labs within last 8760 hours.   Recent Lipid Panel No results found for: CHOL, TRIG, HDL, CHOLHDL, LDLCALC, LDLDIRECT  Wt Readings from Last 3 Encounters:  03/04/19 247 lb (112 kg)  10/09/17 251 lb (113.9 kg)  09/27/17 251 lb 6.4 oz (114 kg)     Objective:    Vital Signs:  Pulse 86   Temp (!) 97.5 F (36.4 C)   Ht 5\' 10"  (1.778 m)   Wt 247 lb (112 kg)   SpO2 96%   BMI 35.44 kg/m    CONSTITUTIONAL:  Well nourished, well developed male in no acute distress.  EYES: anicteric MOUTH: oral mucosa is pink RESPIRATORY: Normal respiratory effort, symmetric expansion CARDIOVASCULAR: No peripheral edema SKIN: No rash, lesions or ulcers MUSCULOSKELETAL: no digital cyanosis NEURO: Cranial Nerves II-XII grossly intact, moves all extremities PSYCH: Intact judgement and insight.  A&O x 3, Mood/affect appropriate   ASSESSMENT & PLAN:    1.   OSA - the patient is tolerating PAP therapy well without any problems. I will get a download from his DME.  The patient has been using and benefiting from PAP use and will continue to benefit from therapy.  If his AHI on download is low then we will consider dropping his pressure to some to see if that makes him feel better since he thinks the pressure may be too high.  2.  HTN - He is not on antihypertensive therapy at this time.  3.  Obesity - I have encouraged him to get into  a routine exercise program and cut back on carbs and portions.   4.  Chest tightness -he says this really does not come on with exertion and usually he is standing or sitting.  Was wondering if it could be related to his CPAP device.  It is right-sided and described as a tightness but is not associated with shortness of breath, diaphoresis or nausea.  It does make him feel weak though sometimes.  He can last up to 15 minutes.  He does have cardiac risk factors including tobacco use remotely, family history of CAD, hypertension, hyperlipidemia and obesity.  Had a stress test done a year ago that was normal.  Due to COVID crisis we have not been doing coronary CTA but I will check to see when we will have this up and running again.  If soon that I will proceed with that otherwise he will need another nuclear stress test and then a calcium score once we are doing CT scans again.  5.  COVID-19 Education:The signs and symptoms of COVID-19 were discussed with the patient and how to seek care for testing (follow up with PCP or arrange E-visit).  The importance of social distancing was discussed today.  Patient Risk:   After full review of this patient's clinical status, I feel that they are at least moderate risk at this time.  Time:   Today, I have spent 15 minutes directly with the patient on Video discussing medical problems including HTN, OSA and healthy diet and exercise for his obesity, chest pain and review of nuclear stress  test a year ago.  We also reviewed the symptoms of COVID 19 and the ways to protect against contracting the virus with telehealth technology.  I spent an additional 5 minutes reviewing patient's chart including PAP compliance download and nuclear stress test.  Medication Adjustments/Labs and Tests Ordered: Current medicines are reviewed at length with the patient today.  Concerns regarding medicines are outlined above.  Tests Ordered: No orders of the defined types were placed in this encounter.  Medication Changes: No orders of the defined types were placed in this encounter.   Disposition:  Follow up in 1 year(s)  Signed, Fransico Him, MD  03/04/2019 8:27 AM    [facility]

## 2019-03-04 ENCOUNTER — Telehealth (INDEPENDENT_AMBULATORY_CARE_PROVIDER_SITE_OTHER): Payer: PPO | Admitting: Cardiology

## 2019-03-04 ENCOUNTER — Encounter: Payer: Self-pay | Admitting: Cardiology

## 2019-03-04 ENCOUNTER — Other Ambulatory Visit: Payer: Self-pay

## 2019-03-04 VITALS — HR 86 | Temp 97.5°F | Ht 70.0 in | Wt 247.0 lb

## 2019-03-04 DIAGNOSIS — R079 Chest pain, unspecified: Secondary | ICD-10-CM | POA: Diagnosis not present

## 2019-03-04 DIAGNOSIS — Z7189 Other specified counseling: Secondary | ICD-10-CM | POA: Diagnosis not present

## 2019-03-04 DIAGNOSIS — I10 Essential (primary) hypertension: Secondary | ICD-10-CM

## 2019-03-04 DIAGNOSIS — E669 Obesity, unspecified: Secondary | ICD-10-CM

## 2019-03-04 DIAGNOSIS — G4733 Obstructive sleep apnea (adult) (pediatric): Secondary | ICD-10-CM | POA: Diagnosis not present

## 2019-03-04 MED ORDER — METOPROLOL TARTRATE 100 MG PO TABS: 100.0000 mg | ORAL_TABLET | Freq: Once | ORAL | 0 refills | Status: DC

## 2019-03-04 NOTE — Patient Instructions (Addendum)
Medication Instructions:   If you need a refill on your cardiac medications before your next appointment, please call your pharmacy.   Lab work: BMET, a few days before Cardiac CT  If you have labs (blood work) drawn today and your tests are completely normal, you will receive your results only by: Marland Kitchen MyChart Message (if you have MyChart) OR . A paper copy in the mail If you have any lab test that is abnormal or we need to change your treatment, we will call you to review the results.  Testing/Procedures: .Your physician has requested that you have cardiac CT. Cardiac computed tomography (CT) is a painless test that uses an x-ray machine to take clear, detailed pictures of your heart. For further information please visit [email or web address]. Please follow instruction sheet as given.  Follow-Up: At [facility], you and your health needs are our priority.  As part of our continuing mission to provide you with exceptional heart care, we have created designated Provider Care Teams.  These Care Teams include your primary Cardiologist (physician) and Advanced Practice Providers (APPs -  Physician Assistants and Nurse Practitioners) who all work together to provide you with the care you need, when you need it. You will need a follow up appointment in 1 years.  Please call our office 2 months in advance to schedule this appointment.  You may see Dr. Radford Pax or one of the following Advanced Practice Providers on your designated Care Team:   Gar[place], PA-C Melina Copa, PA-C . Ermalinda Barrios, PA-C  Any Other Special Instructions Will Be Listed Below (If Applicable).   CARDIAC CT INSTRUCTIONS  Please arrive at the [facility] main entrance of [facility] (30-45 minutes prior to test start time)  [facility] [place], [place] [address] [phone]  Proceed to the [facility] Radiology Department (First Floor).  Please follow these instructions  carefully (unless otherwise directed):  Hold all erectile dysfunction medications at least 48 hours prior to test.  On the Night Before the Test: . Be sure to Drink plenty of water. . Do not consume any caffeinated/decaffeinated beverages or chocolate 12 hours prior to your test. . Do not take any antihistamines 12 hours prior to your test.  On the Day of the Test: . Drink plenty of water. Do not drink any water within one hour of the test. . Do not eat any food 4 hours prior to the test. . You may take your regular medications prior to the test.  . Take metoprolol (Lopressor) 100 mg, tablet, two hours prior to test.  After the Test: . Drink plenty of water. . After receiving IV contrast, you may experience a mild flushed feeling. This is normal. . On occasion, you may experience a mild rash up to 24 hours after the test. This is not dangerous. If this occurs, you can take Benadryl 25 mg and increase your fluid intake. . If you experience trouble breathing, this can be serious. If it is severe call 911 IMMEDIATELY. If it is mild, please call our office.

## 2019-03-04 NOTE — Telephone Encounter (Signed)

## 2019-03-08 ENCOUNTER — Other Ambulatory Visit: Payer: PPO

## 2019-03-11 ENCOUNTER — Other Ambulatory Visit: Payer: Self-pay

## 2019-03-11 ENCOUNTER — Other Ambulatory Visit: Payer: PPO | Admitting: *Deleted

## 2019-03-11 DIAGNOSIS — R079 Chest pain, unspecified: Secondary | ICD-10-CM | POA: Diagnosis not present

## 2019-03-11 DIAGNOSIS — I10 Essential (primary) hypertension: Secondary | ICD-10-CM

## 2019-03-11 LAB — BASIC METABOLIC PANEL
BUN/Creatinine Ratio: 14 (ref 10–24)
BUN: 13 mg/dL (ref 8–27)
CO2: 18 mmol/L — ABNORMAL LOW (ref 20–29)
Calcium: 8.5 mg/dL — ABNORMAL LOW (ref 8.6–10.2)
Chloride: 103 mmol/L (ref 96–106)
Creatinine, Ser: 0.92 mg/dL (ref 0.76–1.27)
GFR calc Af Amer: 101 mL/min/{1.73_m2} (ref >59)
GFR calc non Af Amer: 87 mL/min/{1.73_m2} (ref >59)
Glucose: 159 mg/dL — ABNORMAL HIGH (ref 65–99)
Potassium: 4 mmol/L (ref 3.5–5.2)
Sodium: 137 mmol/L (ref 134–144)

## 2019-03-12 ENCOUNTER — Telehealth (HOSPITAL_COMMUNITY): Payer: Self-pay | Admitting: Emergency Medicine

## 2019-03-12 NOTE — Telephone Encounter (Signed)
Left message on voicemail with name and callback number Marchia Bond RN Navigator Cardiac [facility] [phone] Office [phone] Cell  mychart message sent

## 2019-03-13 ENCOUNTER — Ambulatory Visit (HOSPITAL_COMMUNITY)
Admission: RE | Admit: 2019-03-13 | Discharge: 2019-03-13 | Disposition: A | Discharge status: Home / Self Care | Payer: PPO | Source: Ambulatory Visit | Attending: Cardiology | Admitting: Cardiology

## 2019-03-13 ENCOUNTER — Telehealth: Payer: Self-pay

## 2019-03-13 ENCOUNTER — Other Ambulatory Visit: Payer: Self-pay

## 2019-03-13 DIAGNOSIS — R079 Chest pain, unspecified: Secondary | ICD-10-CM

## 2019-03-13 DIAGNOSIS — I10 Essential (primary) hypertension: Secondary | ICD-10-CM | POA: Insufficient documentation

## 2019-03-13 DIAGNOSIS — I700 Atherosclerosis of aorta: Secondary | ICD-10-CM | POA: Diagnosis not present

## 2019-03-13 DIAGNOSIS — E669 Obesity, unspecified: Secondary | ICD-10-CM | POA: Diagnosis not present

## 2019-03-13 DIAGNOSIS — Z6835 Body mass index (BMI) 35.0-35.9, adult: Secondary | ICD-10-CM | POA: Insufficient documentation

## 2019-03-13 MED ORDER — METOPROLOL TARTRATE 100 MG PO TABS: 100.0000 mg | ORAL_TABLET | Freq: Once | ORAL | 0 refills | Status: DC

## 2019-03-13 MED ORDER — NITROGLYCERIN 0.4 MG SL SUBL
SUBLINGUAL_TABLET | SUBLINGUAL | Status: AC
  Filled 2019-03-13: qty 2

## 2019-03-13 MED ORDER — NITROGLYCERIN 0.4 MG SL SUBL
0.8000 mg | SUBLINGUAL_TABLET | Freq: Once | SUBLINGUAL | Status: AC
  Administered 2019-03-13: 0.8 mg via SUBLINGUAL
  Filled 2019-03-13: qty 25

## 2019-03-13 MED ORDER — IOHEXOL 350 MG/ML SOLN
100.0000 mL | Freq: Once | INTRAVENOUS | Status: AC | PRN
  Administered 2019-03-13: 11:00:00 100 mL via INTRAVENOUS

## 2019-03-13 NOTE — Telephone Encounter (Signed)
Left message to call Ivin Booty.  Need to schedule ct again/machine didn't work per [employer].  Per Claiborne Billings in [place] he will be credit for today scan. Message to [place] regarding new scan and spoke with Suezanne Jacquet regarding what had happen.  He will put new order for meds.

## 2019-03-13 NOTE — Telephone Encounter (Signed)
The Cardiac CT malfunctioned and a new order needed to be placed.

## 2019-03-13 NOTE — Progress Notes (Signed)
CT scan completed. Tolerated well. D/C home walking awake and alert. In no distress.

## 2019-03-14 ENCOUNTER — Telehealth: Payer: Self-pay | Admitting: *Deleted

## 2019-03-14 NOTE — Telephone Encounter (Signed)
-----   Message from Sueanne Margarita, MD sent at 03/13/2019 10:19 AM EDT ----- AHI is mildly elevated.  Please find out what mask patient is using and if they are sleeping on her back.  Also find out if the mask is leaking.

## 2019-03-14 NOTE — Telephone Encounter (Signed)
Informed patient of compliance results and verbalized understanding was indicated. Patient is using a Full face over the nose mask. He does not sleep on his back. Patient states his mask does not leak.

## 2019-03-18 ENCOUNTER — Telehealth (HOSPITAL_COMMUNITY): Payer: Self-pay | Admitting: Emergency Medicine

## 2019-03-18 NOTE — Telephone Encounter (Signed)
Reaching out to patient to offer assistance regarding upcoming cardiac imaging study; pt verbalizes understanding of appt date/time, parking situation and where to check in, pre-test NPO status and medications ordered, and verified current allergies; name and call back number provided for further questions should they arise Marchia Bond RN [facility] [phone] office [phone] cell  Explained to patient why CT scan needs repeated, pt verbalized understanding. Pt denies COVID-19 symptoms. Verbalized understanding of when to take beta blocker.

## 2019-03-19 ENCOUNTER — Ambulatory Visit (HOSPITAL_COMMUNITY)
Admission: RE | Admit: 2019-03-19 | Discharge: 2019-03-19 | Disposition: A | Discharge status: Home / Self Care | Payer: PPO | Source: Ambulatory Visit | Attending: Cardiology | Admitting: Cardiology

## 2019-03-19 ENCOUNTER — Other Ambulatory Visit: Payer: Self-pay

## 2019-03-19 DIAGNOSIS — I2510 Atherosclerotic heart disease of native coronary artery without angina pectoris: Secondary | ICD-10-CM | POA: Insufficient documentation

## 2019-03-19 DIAGNOSIS — R079 Chest pain, unspecified: Secondary | ICD-10-CM | POA: Insufficient documentation

## 2019-03-19 MED ORDER — NITROGLYCERIN 0.4 MG SL SUBL
0.8000 mg | SUBLINGUAL_TABLET | SUBLINGUAL | Status: DC | PRN
  Administered 2019-03-19: 09:00:00 0.8 mg via SUBLINGUAL

## 2019-03-19 MED ORDER — NITROGLYCERIN 0.4 MG SL SUBL
SUBLINGUAL_TABLET | SUBLINGUAL | Status: AC
  Filled 2019-03-19: qty 2

## 2019-03-19 MED ORDER — IOHEXOL 350 MG/ML SOLN
100.0000 mL | Freq: Once | INTRAVENOUS | Status: AC | PRN
  Administered 2019-03-19: 100 mL via INTRAVENOUS

## 2019-03-20 ENCOUNTER — Telehealth: Payer: Self-pay | Admitting: Cardiology

## 2019-03-20 NOTE — Telephone Encounter (Signed)
Follow Up:    Pt said he was told to call back and ask for somebody in Triage.

## 2019-03-20 NOTE — Telephone Encounter (Signed)
Spoke with the pt re: Dr. Theodosia Blender note on his Coronary CTA... still waiting on cardiac portion. Pt understands and wait for a call back after the rest of the study is reviewed.

## 2019-03-21 ENCOUNTER — Telehealth: Payer: Self-pay | Admitting: *Deleted

## 2019-03-21 DIAGNOSIS — G4733 Obstructive sleep apnea (adult) (pediatric): Secondary | ICD-10-CM

## 2019-03-21 NOTE — Telephone Encounter (Signed)
-----   Message from Sueanne Margarita, MD sent at 03/17/2019  8:33 PM EDT ----- AHI is too high - Patient felt that his pressure was too high.  Please get a 2 week autotitration from 4 to 20cm H2O and get a download in 2 weeks

## 2019-03-21 NOTE — Telephone Encounter (Signed)
Order placed to [facility] today.

## 2019-03-25 ENCOUNTER — Telehealth: Payer: Self-pay | Admitting: Cardiology

## 2019-03-25 NOTE — Telephone Encounter (Signed)
New Message   Patient calling to get results for cardiac CT please give him a call back.

## 2019-03-27 MED ORDER — ISOSORBIDE MONONITRATE ER 30 MG PO TB24: 15.0000 mg | ORAL_TABLET | Freq: Every day | ORAL | 3 refills | Status: DC

## 2019-03-27 NOTE — Telephone Encounter (Signed)
Spoke with the patient, he expressed understanding about taking imdur and will see a PA in 4 weeks. Message sent to [facility]. He is calling to have results of fasting labs to our office.

## 2019-03-27 NOTE — Telephone Encounter (Signed)
Notes recorded by Sueanne Margarita, MD on 03/24/2019 at 9:46 PM EDT Please let patient know that FFR on coronary CTA showed no flow limited lesions. He needs aggressive risk factor modification. Continue ASA, statin and BB. Please get last FLP from PCP. Add Imdur 15mg  daily for possible vasospasm and moderate CAD and followup with extender in 4 weeks.

## 2019-03-29 ENCOUNTER — Telehealth: Payer: Self-pay | Admitting: Pharmacist

## 2019-03-29 NOTE — Telephone Encounter (Signed)
Patient with joint pains to atorvastatin. Currently on Simvastatin 40mg . LDL 89 in Jan. Will need high intensity statin due to coronary calcium score of 944, 91st percentile. Rosuvastatin would be the best option. I have left a message on patient voicemail to set up a time to discuss lipids.

## 2019-04-02 NOTE — Telephone Encounter (Signed)
Follow up ° ° °Patient is returning call. Please call. °

## 2019-04-02 NOTE — Telephone Encounter (Addendum)
Patient ID: COBIN CADAVID                 DOB: 02-Sep-1951                    MRN: [id number]     HPI: Martin Hopkins is a 68 y.o. male patient referred to lipid clinic by Dr. Radford Pax. PMH is significant for OSA, obesity and HTN. Patient also has a coronary calcium score of 944 and is in the 91st percentile for his age and sex. LDL was still above goal at 89 in January 2020.   Contacted patient via telephone due to national recommendations for restrictions regarding the COVID-19 pandemic in effort to limit this patient's exposure. Patient reports compliance with simvastatin 40 mg once daily. Patient also reports he has tried both atorvastatin and rosuvastatin int he past with occurrence of joint pain with both medications.   Current Medications: Simvastatin 40 mg daily  Intolerances: Atorvastatin, rosuvastatin (joint pain) Risk Factors: Coronary calcium score 944 (91st percentile)  LDL goal: <70  Family History: The patient's family history includes CAD in his father and paternal grandfather; Hypertension in his brother and mother; Peripheral vascular disease in his brother and father.  Social History: Patient is a former smoker. Reports rare alcohol use and denies drug use.   Labs:Lipid panel 11/13/2018: TC 172, TG 239, HDL 35, LDL 89, non-HDL 137  Past Medical History:  Diagnosis Date  . Adenomatous polyp    Dr Cristina Gong  . Allergic rhinitis   . ED (erectile dysfunction)   . Fatty liver   . GERD (gastroesophageal reflux disease)    mild  . H/O: duodenal ulcer 1982  . Hypercholesteremia   . Hypertension   . Hypogonadism male   . Hypothyroidism   . Lumbar disc disease    L5--Dr Cay Schillings  . Obesity (BMI 30-39.9)   . OSA (obstructive sleep apnea)    AHI 40/hr now on CPAP 9cm H2O    Current Outpatient Medications on File Prior to Visit  Medication Sig Dispense Refill  . aspirin EC 81 MG tablet Take 1 tablet (81 mg total) by mouth daily. 90 tablet 3  . cetirizine (ZYRTEC) 10 MG  tablet Take 10 mg by mouth every other day.    . isosorbide mononitrate (IMDUR) 30 MG 24 hr tablet Take 0.5 tablets (15 mg total) by mouth daily. 45 tablet 3  . levothyroxine (SYNTHROID, LEVOTHROID) 150 MCG tablet Take 150 mcg by mouth daily before breakfast.    . metoprolol tartrate (LOPRESSOR) 100 MG tablet Take 1 tablet (100 mg total) by mouth once for 1 dose. 2 hours before your Cardiac CT 1 tablet 0  . simvastatin (ZOCOR) 40 MG tablet Take 40 mg by mouth every evening.  6   No current facility-administered medications on file prior to visit.     Allergies  Allergen Reactions  . Penicillins Shortness Of Breath  . Lipitor [Atorvastatin]     Joint pain     Assessment/Plan:  1. Hyperlipidemia -  Patient continues to be above LDL goal at 76 on 04/04/2019 (LDL goal <70 with elevated coronary calcium score). Will assess patient's prior use of rosuvastatin and consider restarting at a lower dose if patient is willing. Can also consider starting Zetia 10 mg daily to help LDL decrease to below goal. Will follow up with patient via telephone to assess lipid lowering options.   Gwenlyn Found, Sherian Rein D PGY1 Pharmacy Resident  Phone [phone]  04/05/2019   11:26 AM

## 2019-04-04 ENCOUNTER — Other Ambulatory Visit: Payer: Self-pay | Admitting: *Deleted

## 2019-04-04 ENCOUNTER — Other Ambulatory Visit: Payer: Self-pay

## 2019-04-04 ENCOUNTER — Other Ambulatory Visit: Payer: PPO | Admitting: *Deleted

## 2019-04-04 DIAGNOSIS — E785 Hyperlipidemia, unspecified: Secondary | ICD-10-CM | POA: Diagnosis not present

## 2019-04-04 LAB — LIPID PANEL
Chol/HDL Ratio: 5.5 ratio — ABNORMAL HIGH (ref 0.0–5.0)
Cholesterol, Total: 159 mg/dL (ref 100–199)
HDL: 29 mg/dL — ABNORMAL LOW (ref >39)
LDL Calculated: 76 mg/dL (ref 0–99)
Triglycerides: 268 mg/dL — ABNORMAL HIGH (ref 0–149)
VLDL Cholesterol Cal: 54 mg/dL — ABNORMAL HIGH (ref 5–40)

## 2019-04-04 LAB — HEPATIC FUNCTION PANEL
ALT: 62 IU/L — ABNORMAL HIGH (ref 0–44)
AST: 49 IU/L — ABNORMAL HIGH (ref 0–40)
Albumin: 4.5 g/dL (ref 3.8–4.8)
Alkaline Phosphatase: 80 IU/L (ref 39–117)
Bilirubin Total: 0.4 mg/dL (ref 0.0–1.2)
Bilirubin, Direct: 0.12 mg/dL (ref 0.00–0.40)
Total Protein: 7.2 g/dL (ref 6.0–8.5)

## 2019-04-05 ENCOUNTER — Telehealth: Payer: Self-pay

## 2019-04-05 DIAGNOSIS — E785 Hyperlipidemia, unspecified: Secondary | ICD-10-CM

## 2019-04-05 MED ORDER — ICOSAPENT ETHYL 1 G PO CAPS: 2.0000 g | ORAL_CAPSULE | Freq: Two times a day (BID) | ORAL | 3 refills | Status: DC

## 2019-04-05 NOTE — Telephone Encounter (Signed)
Spoke with the patient, he expressed understanding about taking [name]. Repeat labs scheduled.

## 2019-04-05 NOTE — Telephone Encounter (Signed)
-----   Message from Sueanne Margarita, MD sent at 04/04/2019  9:01 PM EDT ----- Triglycerides too high.  Please add [name] 2gm BID and repeat lipids and ALT in 3 months

## 2019-04-05 NOTE — Telephone Encounter (Signed)
Patient contacted to go over lipid panel results and start lipid lowering options (refer to previous note)

## 2019-04-08 NOTE — Telephone Encounter (Signed)
**Note De-Identified Licet Dunphy Obfuscation** I attempted to do a [name] tier exception through covermymeds but was unable. I then called [employer] and s/w Sharyn Lull who transferred my call to [facility] who transferred my call to [facility] who transferred me to [place] who transferred my call to a Pharmacist named Katrin.  I did the tier exception over the phone with Sydnee Cabal who states that they will notify us by fax today of their decision on this [name] tier exception.

## 2019-04-08 NOTE — Telephone Encounter (Signed)
Spoke with patient to follow up on cholesterol panel. Dr. Radford Pax already prescribed [name] 2g BID for elevated TG. He will start these today. We also discussed moving his Zocor Rx to the evening. He will do this since cholesterol production is highest at night. Will defer any additional changes at this time. We will plan to follow up if needed after repeat panel in August.

## 2019-04-11 NOTE — Telephone Encounter (Signed)
**Note De-Identified Marlea Gambill Obfuscation** [employer] has denied a tier exception on the pts Vasepa. Reason:  Dalene Seltzer is already at the lowest tier for a name brand mediation.  I have called and left a detailed message on the pts VM and I asked that he call us back.

## 2019-04-11 NOTE — Telephone Encounter (Signed)
**Note De-Identified Amaya Blakeman Obfuscation** The pt called back and I advised him that I tried but could not get the cost of his [name] lowered. He thanked me for trying and stated that he is ok with the cost of [name] ($180/90 day supply) at this time as his wife  has a flex pay account that has $2000 left on it so they want to use as much of that as they can.  He is aware to call us back if he gets to a point that he can no longer afford [name].

## 2019-06-17 ENCOUNTER — Other Ambulatory Visit: Payer: PPO | Admitting: *Deleted

## 2019-06-17 ENCOUNTER — Other Ambulatory Visit: Payer: Self-pay | Admitting: Cardiology

## 2019-06-17 ENCOUNTER — Other Ambulatory Visit: Payer: Self-pay

## 2019-06-17 DIAGNOSIS — E785 Hyperlipidemia, unspecified: Secondary | ICD-10-CM

## 2019-06-17 LAB — LIPID PANEL
Chol/HDL Ratio: 4.1 ratio (ref 0.0–5.0)
Cholesterol, Total: 134 mg/dL (ref 100–199)
HDL: 33 mg/dL — ABNORMAL LOW (ref >39)
LDL Calculated: 59 mg/dL (ref 0–99)
Triglycerides: 212 mg/dL — ABNORMAL HIGH (ref 0–149)
VLDL Cholesterol Cal: 42 mg/dL — ABNORMAL HIGH (ref 5–40)

## 2019-06-17 LAB — HEPATIC FUNCTION PANEL
ALT: 53 IU/L — ABNORMAL HIGH (ref 0–44)
AST: 41 IU/L — ABNORMAL HIGH (ref 0–40)
Albumin: 4.6 g/dL (ref 3.8–4.8)
Alkaline Phosphatase: 61 IU/L (ref 39–117)
Bilirubin Total: 0.7 mg/dL (ref 0.0–1.2)
Bilirubin, Direct: 0.2 mg/dL (ref 0.00–0.40)
Total Protein: 7.2 g/dL (ref 6.0–8.5)

## 2019-06-18 ENCOUNTER — Telehealth: Payer: Self-pay | Admitting: Pharmacist

## 2019-06-18 DIAGNOSIS — E785 Hyperlipidemia, unspecified: Secondary | ICD-10-CM

## 2019-06-18 NOTE — Addendum Note (Signed)
Addended by: Marcelle Overlie D on: 06/18/2019 03:42 PM   Modules accepted: Orders

## 2019-06-18 NOTE — Telephone Encounter (Signed)
LMV for pt to return call to discuss lipids panel results. Will need to discuss diet (carbs/sugars/alcohols). Patient had a recent glucose reading of 159. TG 1 year ago were normal. Improvement on vascepa started about 2 months ago, but still above goal

## 2019-06-18 NOTE — Telephone Encounter (Signed)
Patient returned call. Reviewed labs with patient. LDL is at goal. TG better, but still elevated. Patient was recently at the beach and drank about 21 beers that week. He also states that he does eat a lot of carbs (breads, potatoes) and he knows he needs to improve his diet. States that his wife has been on him about following a strict diet and he plans to start trying.  Patient has had some higher blood sugars on BMP. I suggested that he ask his PCP to do an A1C to rule out prediabetes. His TG were normal a year ago. Will work on diet and recheck lipids in 3 months. Reviewed LFT, still mildly elevated, but improved from 2 months ago.

## 2019-06-27 ENCOUNTER — Telehealth: Payer: Self-pay | Admitting: *Deleted

## 2019-06-27 DIAGNOSIS — G4733 Obstructive sleep apnea (adult) (pediatric): Secondary | ICD-10-CM

## 2019-06-27 NOTE — Telephone Encounter (Signed)
-----   Message from Sueanne Margarita, MD sent at 06/26/2019  7:47 PM EDT ----- AHI is good and compliance good.  He is complaining of too high pressure on occasiona.  He is on auto CPAP.  Please change to auto from 4 to 14cmH2O and get a download in 4 weeks.

## 2019-06-27 NOTE — Telephone Encounter (Signed)
Order placed to [employer] via community message. 

## 2019-06-27 NOTE — Telephone Encounter (Addendum)
Informed patient of compliance results and verbalized understanding was indicated. Patient is aware and agreeable to AHI being within range at 1.2. Patient is aware and agreeable to being in compliance with machine usage. Patient is aware a settings change has been ordered.

## 2019-07-09 DIAGNOSIS — E039 Hypothyroidism, unspecified: Secondary | ICD-10-CM | POA: Diagnosis not present

## 2019-07-09 DIAGNOSIS — K760 Fatty (change of) liver, not elsewhere classified: Secondary | ICD-10-CM | POA: Diagnosis not present

## 2019-07-09 DIAGNOSIS — Z23 Encounter for immunization: Secondary | ICD-10-CM | POA: Diagnosis not present

## 2019-07-09 DIAGNOSIS — I700 Atherosclerosis of aorta: Secondary | ICD-10-CM | POA: Diagnosis not present

## 2019-07-09 DIAGNOSIS — E785 Hyperlipidemia, unspecified: Secondary | ICD-10-CM | POA: Diagnosis not present

## 2019-07-09 DIAGNOSIS — Z125 Encounter for screening for malignant neoplasm of prostate: Secondary | ICD-10-CM | POA: Diagnosis not present

## 2019-08-12 DIAGNOSIS — D1801 Hemangioma of skin and subcutaneous tissue: Secondary | ICD-10-CM | POA: Diagnosis not present

## 2019-08-12 DIAGNOSIS — L814 Other melanin hyperpigmentation: Secondary | ICD-10-CM | POA: Diagnosis not present

## 2019-08-12 DIAGNOSIS — L918 Other hypertrophic disorders of the skin: Secondary | ICD-10-CM | POA: Diagnosis not present

## 2019-08-12 DIAGNOSIS — Z23 Encounter for immunization: Secondary | ICD-10-CM | POA: Diagnosis not present

## 2019-08-12 DIAGNOSIS — C4442 Squamous cell carcinoma of skin of scalp and neck: Secondary | ICD-10-CM | POA: Diagnosis not present

## 2019-08-12 DIAGNOSIS — L570 Actinic keratosis: Secondary | ICD-10-CM | POA: Diagnosis not present

## 2019-08-12 DIAGNOSIS — L820 Inflamed seborrheic keratosis: Secondary | ICD-10-CM | POA: Diagnosis not present

## 2019-08-12 DIAGNOSIS — L821 Other seborrheic keratosis: Secondary | ICD-10-CM | POA: Diagnosis not present

## 2019-08-30 DIAGNOSIS — Z23 Encounter for immunization: Secondary | ICD-10-CM | POA: Diagnosis not present

## 2019-08-30 DIAGNOSIS — E039 Hypothyroidism, unspecified: Secondary | ICD-10-CM | POA: Diagnosis not present

## 2019-09-09 ENCOUNTER — Other Ambulatory Visit: Payer: PPO | Admitting: *Deleted

## 2019-09-09 ENCOUNTER — Other Ambulatory Visit: Payer: Self-pay

## 2019-09-09 DIAGNOSIS — E785 Hyperlipidemia, unspecified: Secondary | ICD-10-CM | POA: Diagnosis not present

## 2019-09-09 LAB — LIPID PANEL
Chol/HDL Ratio: 4.5 ratio (ref 0.0–5.0)
Cholesterol, Total: 138 mg/dL (ref 100–199)
HDL: 31 mg/dL — ABNORMAL LOW (ref >39)
LDL Chol Calc (NIH): 75 mg/dL (ref 0–99)
Triglycerides: 186 mg/dL — ABNORMAL HIGH (ref 0–149)
VLDL Cholesterol Cal: 32 mg/dL (ref 5–40)

## 2019-09-09 LAB — HEPATIC FUNCTION PANEL
ALT: 57 IU/L — ABNORMAL HIGH (ref 0–44)
AST: 47 IU/L — ABNORMAL HIGH (ref 0–40)
Albumin: 4.6 g/dL (ref 3.8–4.8)
Alkaline Phosphatase: 75 IU/L (ref 39–117)
Bilirubin Total: 0.5 mg/dL (ref 0.0–1.2)
Bilirubin, Direct: 0.18 mg/dL (ref 0.00–0.40)
Total Protein: 7.7 g/dL (ref 6.0–8.5)

## 2019-09-10 ENCOUNTER — Telehealth: Payer: Self-pay

## 2019-09-10 NOTE — Telephone Encounter (Signed)
Notes recorded by Frederik Schmidt, RN on 09/10/2019 at 4:02 PM EST  [id number] 11/3  ------

## 2019-09-10 NOTE — Telephone Encounter (Signed)
Notes recorded by Frederik Schmidt, RN on 09/10/2019 at 4:23 PM EST  The patient has been notified of the result and verbalized understanding. All questions (if any) were answered.  Frederik Schmidt, RN 09/10/2019 4:23 PM   I spoke to the patient who said that both Lipitor and Crestor cause extreme muscle ache and pain, but did work on lab values.  He just cannot take them.  Please advise, thank you

## 2019-09-10 NOTE — Telephone Encounter (Signed)
-----   Message from Sueanne Margarita, MD sent at 09/10/2019  8:08 AM EST ----- agree ----- Message ----- From: Ramond Dial, [facility] Sent: 09/10/2019   7:59 AM EST To: Sueanne Margarita, MD, Frederik Schmidt, RN  TG are improved, still a little above goal of <150. LDL is now above goal of <70 due to elevated calcium score. Patient is on moderate intensity statin (simvastatin 40). Would recommend switching to rosuvastatin 40mg  (high intensity statin) for better risk reduction/LDL lowering and hopefully better TG lowering as well. Please also discuss with patient the importance of limiting alcohol, carbs (breads,pasta, rice, potatoes) and sweets. TG have improved, therefore please let patient know he has been doing a good job with diet, but we need to further reduce cards/sweets/alcohols. Repeat lipids in 3 months

## 2019-09-10 NOTE — Telephone Encounter (Signed)
Would recommend adding fenofibrate (tricor 154mg  daily or fenofibrate 160mg  ) for additional LDL lowering and triglyceride lowering if patient cannot tolerate high intensity statin. He is on [name] which will provide him good cardiovascular risk reduction.

## 2019-09-10 NOTE — Telephone Encounter (Signed)
-----   Message from Sueanne Margarita, MD sent at 09/10/2019  8:08 AM EST ----- agree ----- Message ----- From: Ramond Dial, [facility] Sent: 09/10/2019   7:59 AM EST To: Sueanne Margarita, MD, Frederik Schmidt, RN  TG are improved, still a little above goal of <150. LDL is now above goal of <70 due to elevated calcium score. Patient is on moderate intensity statin (simvastatin 40). Would recommend switching to rosuvastatin 40mg  (high intensity statin) for better risk reduction/LDL lowering and hopefully better TG lowering as well. Please also discuss with patient the importance of limiting alcohol, carbs (breads,pasta, rice, potatoes) and sweets. TG have improved, therefore please let patient know he has been doing a good job with diet, but we need to further reduce cards/sweets/alcohols. Repeat lipids in 3 months

## 2019-09-11 NOTE — Telephone Encounter (Signed)
Just generic fenofibrate

## 2019-09-11 NOTE — Telephone Encounter (Signed)
Left message to call office

## 2019-09-11 NOTE — Telephone Encounter (Signed)
lpmtcb 11/4

## 2019-09-13 MED ORDER — FENOFIBRATE 160 MG PO TABS: 160.0000 mg | ORAL_TABLET | Freq: Every day | ORAL | 3 refills | Status: DC

## 2019-09-13 NOTE — Telephone Encounter (Signed)
lpmtcb 11/6

## 2019-09-13 NOTE — Telephone Encounter (Signed)
I spoke to the patient with Dr Turner's/Pharmacy recommendation.  We will start Fenofibrate 160 mg Daily.

## 2019-09-13 NOTE — Addendum Note (Signed)
Addended by: Frederik Schmidt on: 09/13/2019 03:28 PM   Modules accepted: Orders

## 2019-10-25 DIAGNOSIS — C4442 Squamous cell carcinoma of skin of scalp and neck: Secondary | ICD-10-CM | POA: Diagnosis not present

## 2019-11-09 ENCOUNTER — Other Ambulatory Visit: Payer: Self-pay | Admitting: Medical

## 2019-11-09 ENCOUNTER — Emergency Department (EMERGENCY_DEPARTMENT_HOSPITAL)
Admission: EM | Admit: 2019-11-09 | Discharge: 2019-11-09 | Disposition: A | Discharge status: Home / Self Care | Payer: PPO | Source: Home / Self Care | Attending: Emergency Medicine | Admitting: Emergency Medicine

## 2019-11-09 ENCOUNTER — Other Ambulatory Visit: Payer: Self-pay

## 2019-11-09 ENCOUNTER — Encounter (HOSPITAL_COMMUNITY): Payer: Self-pay | Admitting: Emergency Medicine

## 2019-11-09 ENCOUNTER — Emergency Department (HOSPITAL_COMMUNITY): Payer: PPO

## 2019-11-09 DIAGNOSIS — R0600 Dyspnea, unspecified: Secondary | ICD-10-CM

## 2019-11-09 DIAGNOSIS — Z79890 Hormone replacement therapy (postmenopausal): Secondary | ICD-10-CM | POA: Diagnosis not present

## 2019-11-09 DIAGNOSIS — Z880 Allergy status to penicillin: Secondary | ICD-10-CM | POA: Diagnosis not present

## 2019-11-09 DIAGNOSIS — E785 Hyperlipidemia, unspecified: Secondary | ICD-10-CM | POA: Diagnosis present

## 2019-11-09 DIAGNOSIS — I10 Essential (primary) hypertension: Secondary | ICD-10-CM | POA: Diagnosis present

## 2019-11-09 DIAGNOSIS — I509 Heart failure, unspecified: Secondary | ICD-10-CM | POA: Insufficient documentation

## 2019-11-09 DIAGNOSIS — K219 Gastro-esophageal reflux disease without esophagitis: Secondary | ICD-10-CM | POA: Diagnosis present

## 2019-11-09 DIAGNOSIS — R600 Localized edema: Secondary | ICD-10-CM | POA: Diagnosis not present

## 2019-11-09 DIAGNOSIS — R069 Unspecified abnormalities of breathing: Secondary | ICD-10-CM | POA: Diagnosis not present

## 2019-11-09 DIAGNOSIS — I2510 Atherosclerotic heart disease of native coronary artery without angina pectoris: Secondary | ICD-10-CM | POA: Diagnosis not present

## 2019-11-09 DIAGNOSIS — R0602 Shortness of breath: Secondary | ICD-10-CM | POA: Diagnosis present

## 2019-11-09 DIAGNOSIS — E7800 Pure hypercholesterolemia, unspecified: Secondary | ICD-10-CM | POA: Diagnosis present

## 2019-11-09 DIAGNOSIS — Z888 Allergy status to other drugs, medicaments and biological substances status: Secondary | ICD-10-CM | POA: Diagnosis not present

## 2019-11-09 DIAGNOSIS — K760 Fatty (change of) liver, not elsewhere classified: Secondary | ICD-10-CM | POA: Diagnosis present

## 2019-11-09 DIAGNOSIS — E039 Hypothyroidism, unspecified: Secondary | ICD-10-CM | POA: Diagnosis present

## 2019-11-09 DIAGNOSIS — I110 Hypertensive heart disease with heart failure: Secondary | ICD-10-CM | POA: Insufficient documentation

## 2019-11-09 DIAGNOSIS — I200 Unstable angina: Secondary | ICD-10-CM | POA: Diagnosis not present

## 2019-11-09 DIAGNOSIS — R079 Chest pain, unspecified: Secondary | ICD-10-CM | POA: Insufficient documentation

## 2019-11-09 DIAGNOSIS — I25110 Atherosclerotic heart disease of native coronary artery with unstable angina pectoris: Secondary | ICD-10-CM | POA: Diagnosis present

## 2019-11-09 DIAGNOSIS — R52 Pain, unspecified: Secondary | ICD-10-CM | POA: Diagnosis not present

## 2019-11-09 DIAGNOSIS — Z8249 Family history of ischemic heart disease and other diseases of the circulatory system: Secondary | ICD-10-CM | POA: Diagnosis not present

## 2019-11-09 DIAGNOSIS — Z79899 Other long term (current) drug therapy: Secondary | ICD-10-CM | POA: Insufficient documentation

## 2019-11-09 DIAGNOSIS — Z87891 Personal history of nicotine dependence: Secondary | ICD-10-CM | POA: Diagnosis not present

## 2019-11-09 DIAGNOSIS — G4733 Obstructive sleep apnea (adult) (pediatric): Secondary | ICD-10-CM | POA: Diagnosis present

## 2019-11-09 DIAGNOSIS — Z7982 Long term (current) use of aspirin: Secondary | ICD-10-CM | POA: Diagnosis not present

## 2019-11-09 DIAGNOSIS — Z20822 Contact with and (suspected) exposure to covid-19: Secondary | ICD-10-CM | POA: Diagnosis present

## 2019-11-09 LAB — BRAIN NATRIURETIC PEPTIDE: B Natriuretic Peptide: 182.5 pg/mL — ABNORMAL HIGH (ref 0.0–100.0)

## 2019-11-09 LAB — POC SARS CORONAVIRUS 2 AG -  ED: SARS Coronavirus 2 Ag: NEGATIVE

## 2019-11-09 LAB — BASIC METABOLIC PANEL
Anion gap: 11 (ref 5–15)
BUN: 22 mg/dL (ref 8–23)
CO2: 20 mmol/L — ABNORMAL LOW (ref 22–32)
Calcium: 9.1 mg/dL (ref 8.9–10.3)
Chloride: 104 mmol/L (ref 98–111)
Creatinine, Ser: 0.99 mg/dL (ref 0.61–1.24)
GFR calc Af Amer: >60 mL/min (ref >60)
GFR calc non Af Amer: >60 mL/min (ref >60)
Glucose, Bld: 135 mg/dL — ABNORMAL HIGH (ref 70–99)
Potassium: 4.1 mmol/L (ref 3.5–5.1)
Sodium: 135 mmol/L (ref 135–145)

## 2019-11-09 LAB — CBC
HCT: 42.9 % (ref 39.0–52.0)
Hemoglobin: 14.5 g/dL (ref 13.0–17.0)
MCH: 31.7 pg (ref 26.0–34.0)
MCHC: 33.8 g/dL (ref 30.0–36.0)
MCV: 93.7 fL (ref 80.0–100.0)
Platelets: 190 10*3/uL (ref 150–400)
RBC: 4.58 MIL/uL (ref 4.22–5.81)
RDW: 12.6 % (ref 11.5–15.5)
WBC: 5.8 10*3/uL (ref 4.0–10.5)
nRBC: 0 % (ref 0.0–0.2)

## 2019-11-09 LAB — TROPONIN I (HIGH SENSITIVITY)
Troponin I (High Sensitivity): 2 ng/L (ref <18)
Troponin I (High Sensitivity): 7 ng/L (ref <18)

## 2019-11-09 LAB — D-DIMER, QUANTITATIVE: D-Dimer, Quant: <0.27 ug/mL-FEU (ref 0.00–0.50)

## 2019-11-09 MED ORDER — FUROSEMIDE 10 MG/ML IJ SOLN: 40.0000 mg | Freq: Once | INTRAMUSCULAR | Status: DC

## 2019-11-09 MED ORDER — FUROSEMIDE 20 MG PO TABS: 20.0000 mg | ORAL_TABLET | Freq: Every day | ORAL | 0 refills | Status: DC

## 2019-11-09 MED ORDER — SODIUM CHLORIDE 0.9% FLUSH: 3.0000 mL | Freq: Once | INTRAVENOUS | Status: DC

## 2019-11-09 MED ORDER — FUROSEMIDE 10 MG/ML IJ SOLN
40.0000 mg | Freq: Once | INTRAMUSCULAR | Status: AC
  Administered 2019-11-09: 40 mg via INTRAMUSCULAR
  Filled 2019-11-09: qty 4

## 2019-11-09 NOTE — ED Provider Notes (Addendum)
[facility] EMERGENCY DEPARTMENT Provider Note   CSN: [id number] Arrival date & time: 11/09/19  0701     History Chief Complaint  Patient presents with  . Chest Pain  . Shortness of Breath  . Dizziness    Martin Hopkins is a 68 y.o. male.  HPI 69 yo male complaining of chest pressure and dyspnea began 2 nights ago awoke from sleep.  Unable to get comfortable for about one hour.  States he sat up in chair with some improvement. Ok during day yesterday except for a dizzy spell- felt lightheaded for a few minutes, no chest pressure or sob.  Again awoken from sleep at 0100 this am with sob and some chest pressur 1-2/10.  Sob severe.  Patient sleeps with bipap.  NO known exposure to covid, some cough for two weeks.  NO fever, chills, slight nausea with dizziness.     Past Medical History:  Diagnosis Date  . Adenomatous polyp    Dr Cristina Gong  . Allergic rhinitis   . ED (erectile dysfunction)   . Fatty liver   . GERD (gastroesophageal reflux disease)    mild  . H/O: duodenal ulcer 1982  . Hypercholesteremia   . Hypertension   . Hypogonadism male   . Hypothyroidism   . Lumbar disc disease    L5--Dr Cay Schillings  . Obesity (BMI 30-39.9)   . OSA (obstructive sleep apnea)    AHI 40/hr now on CPAP 9cm H2O    Patient Active Problem List   Diagnosis Date Noted  . Chest pain 09/27/2017  . OSA (obstructive sleep apnea) 01/08/2014  . Essential hypertension, benign 01/08/2014  . Obesity (BMI 30-39.9)     Past Surgical History:  Procedure Laterality Date  . LUMBAR LAMINECTOMY  09/28/2011   Procedure: MICRODISCECTOMY LUMBAR LAMINECTOMY;  Surgeon: Tobi Bastos;  Location: [facility];  Service: Orthopedics;  Laterality: Left;  Hemi-Laminectomy, Microdiscectomy  Lumbar 5 - Sacral 1 Left    . NASAL FRACTURE SURGERY  10/1971  . OTHER SURGICAL HISTORY  1972   nasal surgery   . OTHER SURGICAL HISTORY  2011   right trigger finger surgey   . right hand surgery     remove  knots  . VASECTOMY         Family History  Problem Relation Age of Onset  . Hypertension Mother   . CAD Father   . Peripheral vascular disease Father   . Peripheral vascular disease Brother   . Hypertension Brother   . CAD Paternal Grandfather     Social History   Tobacco Use  . Smoking status: Former Smoker    Quit date: 11/07/1990    Years since quitting: 29.0  . Smokeless tobacco: Never Used  Substance Use Topics  . Alcohol use: Yes    Comment: rare  . Drug use: No    Home Medications Prior to Admission medications   Medication Sig Start Date End Date Taking? Authorizing Provider  aspirin EC 81 MG tablet Take 1 tablet (81 mg total) by mouth daily. 07/11/17   Dunn, Nedra Hai, PA-C  cetirizine (ZYRTEC) 10 MG tablet Take 10 mg by mouth every other day.    [provider]  fenofibrate 160 MG tablet Take 1 tablet (160 mg total) by mouth daily. 09/13/19   Sueanne Margarita, MD  Icosapent Ethyl 1 g CAPS Take 2 capsules (2 g total) by mouth 2 (two) times a day. 04/05/19 04/04/20  Sueanne Margarita, MD  isosorbide mononitrate (IMDUR) 30 MG 24 hr tablet Take 0.5 tablets (15 mg total) by mouth daily. 03/27/19 03/26/20  Sueanne Margarita, MD  levothyroxine (SYNTHROID, LEVOTHROID) 150 MCG tablet Take 150 mcg by mouth daily before breakfast.    [provider]  metoprolol tartrate (LOPRESSOR) 100 MG tablet Take 1 tablet (100 mg total) by mouth once for 1 dose. 2 hours before your Cardiac CT 03/13/19 03/13/19  Sueanne Margarita, MD  simvastatin (ZOCOR) 40 MG tablet Take 40 mg by mouth every evening. 09/09/14   [provider]    Allergies    Penicillins and Lipitor [atorvastatin]  Review of Systems   Review of Systems  Constitutional: Positive for unexpected weight change.  HENT: Negative.   Eyes: Negative.   Respiratory: Positive for chest tightness and shortness of breath.   Cardiovascular: Negative for palpitations and leg swelling.  Gastrointestinal: Negative.    Endocrine: Negative.   Genitourinary: Negative.   Musculoskeletal: Positive for arthralgias.  Skin: Negative.   Allergic/Immunologic: Negative.   Neurological: Negative.   Hematological: Negative.   Psychiatric/Behavioral: Negative.   All other systems reviewed and are negative.   Physical Exam Updated Vital Signs BP (!) 153/84 (BP Location: Left Arm)   Pulse 90   Temp 98.1 F (36.7 C) (Oral)   Resp 16   SpO2 96%   Physical Exam  ED Results / Procedures / Treatments   Labs (all labs ordered are listed, but only abnormal results are displayed) Labs Reviewed  BASIC METABOLIC PANEL - Abnormal; Notable for the following components:      Result Value   CO2 20 (*)    Glucose, Bld 135 (*)    All other components within normal limits  BRAIN NATRIURETIC PEPTIDE - Abnormal; Notable for the following components:   B Natriuretic Peptide 182.5 (*)    All other components within normal limits  CBC  D-DIMER, QUANTITATIVE (NOT AT [facility])  POC SARS CORONAVIRUS 2 AG -  ED  TROPONIN I (HIGH SENSITIVITY)  TROPONIN I (HIGH SENSITIVITY)    EKG EKG Interpretation  Date/Time:  Saturday November 09 2019 07:14:25 EST Ventricular Rate:  87 PR Interval:  166 QRS Duration: 90 QT Interval:  384 QTC Calculation: 462 R Axis:   64 Text Interpretation: Normal sinus rhythm Normal ECG Confirmed by Pattricia Boss [phone]) on 11/09/2019 8:41:16 AM   Radiology DG Chest 2 View  Result Date: 11/09/2019 CLINICAL DATA:  Chest pain. EXAM: CHEST - 2 VIEW COMPARISON:  Chest radiograph 05/17/2016 FINDINGS: Heart size within normal limits. Aortic atherosclerosis. No airspace consolidation. No evidence of pleural effusion or pneumothorax. No acute bony abnormality. Cervical spinal fusion hardware. IMPRESSION: No evidence of acute cardiopulmonary abnormality. Electronically Signed   By: Kellie Simmering DO   On: 11/09/2019 08:07    Procedures Procedures (including critical care time)  Medications Ordered in ED  Medications  sodium chloride flush (NS) 0.9 % injection 3 mL (has no administration in time range)    ED Course  I have reviewed the triage vital signs and the nursing notes.  Pertinent labs & imaging results that were available during my care of the patient were reviewed by me and considered in my medical decision making (see chart for details). Cardiology consult pending     MDM Rules/Calculators/A&P                       Final Clinical Impression(s) / ED Diagnoses Final diagnoses:  Congestive heart failure, unspecified  HF chronicity, unspecified heart failure type ([place])  Chest pain, unspecified type    Rx / DC Orders ED Discharge Orders    None       Pattricia Boss, MD 11/10/19 1455    Pattricia Boss, MD 11/10/19 [phone]

## 2019-11-09 NOTE — Consult Note (Signed)
Cardiology Consultation:  Patient ID: LUKIS MASSAR MRN: [id number]; DOB: 06/18/1951  Admit date: 11/09/2019 Date of Consult: 11/09/2019  Primary Care Provider: Harlan Stains, MD Primary Cardiologist: No primary care provider on file.   Chief Complaint: SOB/CP  Patient Profile:  Martin Hopkins is a 69 y.o. 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  History of Present Illness:  Mr. Martin Hopkins presents with 2 days of worsening shortness of breath at night.  He reports 2 nights ago he was awoken in his sleep suddenly with shortness of breath.  He reported he had right-sided chest pressure that lasted for several minutes as well.  He reports he was able to catch his breath and went back to sleep.  He reports a similar story the night previous to coming to the hospital.  He reports around 1 AM he was woken up with profound shortness of breath.  He reports his CPAP machine did not help him.  He had no further episodes of chest pain per his report.  He reports some lower extremity edema and 10 pound weight gain over the past few weeks.  He reports this is bothersome for him as he is not eating enough to gain his weight.  He also reports recent changes in his cholesterol medications which are concerning to him.  Blood pressure slightly elevated in the emergency room and saturations good on room air.  Laboratory data significant for high-sensitivity troponin of 2->7.  BNP 182.  EKG normal sinus rhythm heart rate 87 without any acute ischemic changes.  Chest x-ray without any acute cardiopulmonary process.  No leukocytosis noted either.  Kidney function stable.   Heart Pathway Score:   HEAR Score: 5   Past Medical History: Past Medical History:  Diagnosis Date  . Adenomatous polyp    Dr Cristina Gong  . Allergic rhinitis   . ED (erectile  dysfunction)   . Fatty liver   . GERD (gastroesophageal reflux disease)    mild  . H/O: duodenal ulcer 1982  . Hypercholesteremia   . Hypertension   . Hypogonadism male   . Hypothyroidism   . Lumbar disc disease    L5--Dr Cay Schillings  . Obesity (BMI 30-39.9)   . OSA (obstructive sleep apnea)    AHI 40/hr now on CPAP 9cm H2O    Past Surgical History: Past Surgical History:  Procedure Laterality Date  . LUMBAR LAMINECTOMY  09/28/2011   Procedure: MICRODISCECTOMY LUMBAR LAMINECTOMY;  Surgeon: Tobi Bastos;  Location: [facility];  Service: Orthopedics;  Laterality: Left;  Hemi-Laminectomy, Microdiscectomy  Lumbar 5 - Sacral 1 Left    . NASAL FRACTURE SURGERY  10/1971  . OTHER SURGICAL HISTORY  1972   nasal surgery   . OTHER SURGICAL HISTORY  2011   right trigger finger surgey   . right hand surgery     remove knots  . VASECTOMY       Home Medications:  Prior to Admission medications   Medication Sig Start Date End Date Taking? Authorizing Provider  aspirin EC 81 MG tablet Take 1 tablet (81 mg total) by mouth daily. 07/11/17   Dunn, Nedra Hai, PA-C  cetirizine (ZYRTEC) 10 MG tablet Take 10 mg by mouth every other day.    [provider]  fenofibrate 160 MG tablet Take 1 tablet (160 mg total)  by mouth daily. 09/13/19   Sueanne Margarita, MD  furosemide (LASIX) 20 MG tablet Take 1 tablet (20 mg total) by mouth daily. 11/09/19   Pattricia Boss, MD  Icosapent Ethyl 1 g CAPS Take 2 capsules (2 g total) by mouth 2 (two) times a day. 04/05/19 04/04/20  Sueanne Margarita, MD  isosorbide mononitrate (IMDUR) 30 MG 24 hr tablet Take 0.5 tablets (15 mg total) by mouth daily. 03/27/19 03/26/20  Sueanne Margarita, MD  levothyroxine (SYNTHROID, LEVOTHROID) 150 MCG tablet Take 150 mcg by mouth daily before breakfast.    [provider]  metoprolol tartrate (LOPRESSOR) 100 MG tablet Take 1 tablet (100 mg total) by mouth once for 1 dose. 2 hours before your Cardiac CT 03/13/19 03/13/19  Sueanne Margarita,  MD  simvastatin (ZOCOR) 40 MG tablet Take 40 mg by mouth every evening. 09/09/14   [provider]    Inpatient Medications: Scheduled Meds: . furosemide  40 mg Intravenous Once  . sodium chloride flush  3 mL Intravenous Once   Continuous Infusions:  PRN Meds:   Allergies:    Allergies  Allergen Reactions  . Penicillins Shortness Of Breath  . Lipitor [Atorvastatin]     Joint pain     Social History:   Social History   Socioeconomic History  . Marital status: Married    Spouse name: Not on file  . Number of children: Not on file  . Years of education: Not on file  . Highest education level: Not on file  Occupational History  . Not on file  Tobacco Use  . Smoking status: Former Smoker    Quit date: 11/07/1990    Years since quitting: 29.0  . Smokeless tobacco: Never Used  Substance and Sexual Activity  . Alcohol use: Yes    Comment: rare  . Drug use: No  . Sexual activity: Not on file  Other Topics Concern  . Not on file  Social History Narrative  . Not on file   Social Determinants of Health   Financial Resource Strain:   . Difficulty of Paying Living Expenses: Not on file  Food Insecurity:   . Worried About Charity fundraiser in the Last Year: Not on file  . Ran Out of Food in the Last Year: Not on file  Transportation Needs:   . Lack of Transportation (Medical): Not on file  . Lack of Transportation (Non-Medical): Not on file  Physical Activity:   . Days of Exercise per Week: Not on file  . Minutes of Exercise per Session: Not on file  Stress:   . Feeling of Stress : Not on file  Social Connections:   . Frequency of Communication with Friends and Family: Not on file  . Frequency of Social Gatherings with Friends and Family: Not on file  . Attends Religious Services: Not on file  . Active Member of Clubs or Organizations: Not on file  . Attends Archivist Meetings: Not on file  . Marital Status: Not on file  Intimate Partner  Violence:   . Fear of Current or Ex-Partner: Not on file  . Emotionally Abused: Not on file  . Physically Abused: Not on file  . Sexually Abused: Not on file     Family History:    Family History  Problem Relation Age of Onset  . Hypertension Mother   . CAD Father   . Peripheral vascular disease Father   . Peripheral vascular disease Brother   .  Hypertension Brother   . CAD Paternal Grandfather      ROS:  All other ROS reviewed and negative. Pertinent positives noted in the HPI.     Physical Exam/Data:   Vitals:   11/09/19 0712  BP: (!) 153/84  Pulse: 90  Resp: 16  Temp: 98.1 F (36.7 C)  TempSrc: Oral  SpO2: 96%   No intake or output data in the 24 hours ending 11/09/19 1435  Last 3 Weights 03/04/2019 10/09/2017 09/27/2017  Weight (lbs) 247 lb 251 lb 251 lb 6.4 oz  Weight (kg) 112.038 kg 113.853 kg 114.034 kg    There is no height or weight on file to calculate BMI.  General: Well nourished, well developed, in no acute distress Head: Atraumatic, normal size  Eyes: PEERLA, EOMI  Neck: Supple, no JVD Endocrine: No thryomegaly Cardiac: Normal S1, S2; RRR; no murmurs, rubs, or gallops Lungs: Crackles at the lung bases Abd: Soft, nontender, no hepatomegaly  Ext: No edema, 1+ pitting edema up to mid shin Musculoskeletal: No deformities, BUE and BLE strength normal and equal Skin: Warm and dry, no rashes   Neuro: Alert and oriented to person, place, time, and situation, CNII-XII grossly intact, no focal deficits  Psych: Normal mood and affect   EKG:  The EKG was personally reviewed and demonstrates: Normal sinus rhythm, heart rate 87, no acute ST-T changes, no evidence of prior infarction Telemetry:  Telemetry was personally reviewed and demonstrates: Normal sinus rhythm with heart rate in the 80s  Relevant CV Studies: Cardiac CTA 03/2019 with moderate disease in the LAD and circumflex, negative FFR  Laboratory Data: High Sensitivity Troponin:   Recent Labs  Lab  11/09/19 0730 11/09/19 0926  TROPONINIHS 2 7     Cardiac EnzymesNo results for input(s): TROPONINI in the last 168 hours. No results for input(s): TROPIPOC in the last 168 hours.  Chemistry Recent Labs  Lab 11/09/19 0730  NA 135  K 4.1  CL 104  CO2 20*  GLUCOSE 135*  BUN 22  CREATININE 0.99  CALCIUM 9.1  GFRNONAA >60  GFRAA >60  ANIONGAP 11    No results for input(s): PROT, ALBUMIN, AST, ALT, ALKPHOS, BILITOT in the last 168 hours. Hematology Recent Labs  Lab 11/09/19 0730  WBC 5.8  RBC 4.58  HGB 14.5  HCT 42.9  MCV 93.7  MCH 31.7  MCHC 33.8  RDW 12.6  PLT 190   BNP Recent Labs  Lab 11/09/19 0926  BNP 182.5*    DDimer  Recent Labs  Lab 11/09/19 0926  DDIMER <0.27    Radiology/Studies:  DG Chest 2 View  Result Date: 11/09/2019 CLINICAL DATA:  Chest pain. EXAM: CHEST - 2 VIEW COMPARISON:  Chest radiograph 05/17/2016 FINDINGS: Heart size within normal limits. Aortic atherosclerosis. No airspace consolidation. No evidence of pleural effusion or pneumothorax. No acute bony abnormality. Cervical spinal fusion hardware. IMPRESSION: No evidence of acute cardiopulmonary abnormality. Electronically Signed   By: Kellie Simmering DO   On: 11/09/2019 08:07    Assessment and Plan:  1. SOB concerns for heart failure: Symptoms appear to be more shortness of breath at night.  He appears to be having paroxysmal nocturnal dyspnea.  He reports awakening at night with profound shortness of breath.  Here in the emergency room, his BNP is mildly elevated up to 180 and he does have evidence of lower extremity edema.  His EKG is inconsistent with any ischemic changes and he has no chest pain to be concerned about  regarding an acute coronary syndrome.  I highly suspect he is developing HFpEF or mild heart failure symptoms at night.  I would recommend to give Lasix 40 mg IV x1 and then send him home on Lasix 20 mg daily.  For CAD we can simply treat him with metoprolol succinate 25 mg daily.   He has ruled out for an acute coronary syndrome and this is not concerning.  We will arrange a 1 week follow-up for him to have an office appointment.  He will need an echocardiogram as well as titration of medications at that time.  I discussed admission versus outpatient management with him and he has opted for outpatient management.  I think this is reasonable as he looks very stable on examination and likely will do well with initiation of Lasix.  [facility] will sign off.   Medication Recommendations: Lasix 40 mg IV x1 the emergency room, Lasix 20 mg daily going home, metoprolol succinate 25 mg daily going home Other recommendations (labs, testing, etc): Outpatient echocardiogram Follow up as an outpatient: We will arrange follow-up in 1 week in our clinic  For questions or updates, please contact [place] Please consult [email or web address] for contact info under     Signed, [place] T. Audie Box, [place]  11/09/2019 2:35 PM

## 2019-11-09 NOTE — ED Triage Notes (Signed)
C/o dizziness since yesterday.  Sleeps with CPAP on and woke up at 1am with SOB and pressure to center of chest.  Reports mild chills.  States dizziness is better today.

## 2019-11-09 NOTE — ED Notes (Signed)
Got patient on the monitor patient is resting with call bell in reach  ?

## 2019-11-09 NOTE — ED Notes (Signed)
Walked patient down the hall to the bathroom patient did well stayed at a 100 percent patient is now back in bed with call bell in reach

## 2019-11-09 NOTE — Discharge Instructions (Addendum)
Take medications as prescribed Cardiology should call you regarding follow-up Return to the emergency department if pain is worsened or shortness of breath is worsened.

## 2019-11-10 ENCOUNTER — Telehealth: Payer: Self-pay | Admitting: Medical

## 2019-11-10 MED ORDER — METOPROLOL SUCCINATE ER 25 MG PO TB24: 25.0000 mg | ORAL_TABLET | Freq: Every day | ORAL | 6 refills | Status: DC

## 2019-11-10 NOTE — Telephone Encounter (Signed)
   Patient called the after hours line reporting increased SOB. He was seen in the ED yesterday for the same complains and evaluated by Dr. Audie Box. He was given IV lasix 40mg  x1 in the ED prior to discharge and recommended to take lasix 20mg  daily going forward. He reported significant improvement in symptoms overnight, however SOB returned this morning. He called EMS and reports BP was elevated to 192/102, though otherwise was told his exam was benign and he was not transported to the ED. He just took the lasix 20mg  prior to my call. No complaints of chest pain. Recommend taking lasix 60mg  daily for the next 3 days and close follow-up was arranged with Robbie Lis, PA-C 11/13/2019. He was also recommended to start metoprolol succinate 25mg  daily, however this was not prescribed at discharge. I sent a prescription to his preferred pharmacy and recommended starting today. We discussed the importance of limiting salt/fluid intake and monitoring his weight daily going forward. We discussed return to ED plan. Patient was in agreement with the plan and appreciative of the call.   Abigail Butts, PA-C 11/10/19; 9:33 AM

## 2019-11-11 ENCOUNTER — Telehealth: Payer: Self-pay | Admitting: Cardiology

## 2019-11-11 NOTE — Telephone Encounter (Signed)
Patient states that he has been having trouble breathing since Friday (11/08/19). He states that Friday night it woke him up from his sleep and he couldn't catch his breath. He thought that it may have something to do with his CPAP machine. Saturday afternoon it got worse so he went to the ER. They ran tests and found nothing abnormal in his blood work however they did find fluid build up. The physician there told him it was congestive heart failure. He was given IV Lasix with improvement and discharged on 20 mg Lasix daily. He was feeling better until Sunday afternoon, he spoke with Roby Lofts, PA-C who increased his dose of Lasix to 60 mg daily and also sent in a prescription for metoprolol succinate 25 mg daily. He states he felt a little bit better after this, however today he is feeling much worse. States that he can barely breath and he can not lie down with out feeling like he is going to pass out. He states his BP has been running 130s/90s and heart rate 80-95 bpm. His SaO2 is 93% on room air. He does report some swelling in his legs. I advised him that he should return to the ER. Patient verbalized understanding.

## 2019-11-11 NOTE — Telephone Encounter (Signed)
Pt c/o Shortness Of Breath: STAT if SOB developed within the last 24 hours or pt is noticeably SOB on the phone  1. Are you currently SOB (can you hear that pt is SOB on the phone)? No   2. How long have you been experiencing SOB? Since Friday 11/08/2019  3. Are you SOB when sitting or when up moving around? All the time  4. Are you currently experiencing any other symptoms? Patient does have a pulse oximeter, it shows a reading of 93% on room air, he does not use oxygen, his BP 149/90, he states he takes a deep breath but it doesn't seem to help. Went to the ED on Saturday. He does have a follow up appt on 1/6//21 with Vin.

## 2019-11-11 NOTE — Telephone Encounter (Signed)
Agree with your recommendations.  Needs to go to ER

## 2019-11-12 ENCOUNTER — Telehealth: Payer: Self-pay | Admitting: Internal Medicine

## 2019-11-12 ENCOUNTER — Telehealth: Payer: Self-pay

## 2019-11-12 ENCOUNTER — Other Ambulatory Visit: Payer: Self-pay

## 2019-11-12 ENCOUNTER — Encounter (HOSPITAL_COMMUNITY): Payer: Self-pay | Admitting: *Deleted

## 2019-11-12 ENCOUNTER — Inpatient Hospital Stay (HOSPITAL_COMMUNITY)
Admission: EM | Admit: 2019-11-12 | Discharge: 2019-11-13 | DRG: 287 | Disposition: A | Discharge status: Home / Self Care | Payer: PPO | Attending: Cardiovascular Disease | Admitting: Cardiovascular Disease

## 2019-11-12 ENCOUNTER — Emergency Department (HOSPITAL_COMMUNITY): Payer: PPO

## 2019-11-12 DIAGNOSIS — K219 Gastro-esophageal reflux disease without esophagitis: Secondary | ICD-10-CM | POA: Diagnosis present

## 2019-11-12 DIAGNOSIS — Z8249 Family history of ischemic heart disease and other diseases of the circulatory system: Secondary | ICD-10-CM

## 2019-11-12 DIAGNOSIS — I10 Essential (primary) hypertension: Secondary | ICD-10-CM | POA: Diagnosis present

## 2019-11-12 DIAGNOSIS — Z888 Allergy status to other drugs, medicaments and biological substances status: Secondary | ICD-10-CM | POA: Diagnosis not present

## 2019-11-12 DIAGNOSIS — Z79890 Hormone replacement therapy (postmenopausal): Secondary | ICD-10-CM | POA: Diagnosis not present

## 2019-11-12 DIAGNOSIS — Z7982 Long term (current) use of aspirin: Secondary | ICD-10-CM

## 2019-11-12 DIAGNOSIS — E7800 Pure hypercholesterolemia, unspecified: Secondary | ICD-10-CM | POA: Diagnosis present

## 2019-11-12 DIAGNOSIS — K760 Fatty (change of) liver, not elsewhere classified: Secondary | ICD-10-CM | POA: Diagnosis present

## 2019-11-12 DIAGNOSIS — I200 Unstable angina: Secondary | ICD-10-CM | POA: Diagnosis not present

## 2019-11-12 DIAGNOSIS — R0600 Dyspnea, unspecified: Secondary | ICD-10-CM

## 2019-11-12 DIAGNOSIS — E039 Hypothyroidism, unspecified: Secondary | ICD-10-CM | POA: Diagnosis present

## 2019-11-12 DIAGNOSIS — I25110 Atherosclerotic heart disease of native coronary artery with unstable angina pectoris: Secondary | ICD-10-CM | POA: Diagnosis present

## 2019-11-12 DIAGNOSIS — E785 Hyperlipidemia, unspecified: Secondary | ICD-10-CM | POA: Diagnosis present

## 2019-11-12 DIAGNOSIS — R0602 Shortness of breath: Secondary | ICD-10-CM | POA: Diagnosis present

## 2019-11-12 DIAGNOSIS — Z87891 Personal history of nicotine dependence: Secondary | ICD-10-CM | POA: Diagnosis not present

## 2019-11-12 DIAGNOSIS — Z880 Allergy status to penicillin: Secondary | ICD-10-CM

## 2019-11-12 DIAGNOSIS — I2510 Atherosclerotic heart disease of native coronary artery without angina pectoris: Secondary | ICD-10-CM | POA: Diagnosis not present

## 2019-11-12 DIAGNOSIS — Z20822 Contact with and (suspected) exposure to covid-19: Secondary | ICD-10-CM | POA: Diagnosis present

## 2019-11-12 DIAGNOSIS — G4733 Obstructive sleep apnea (adult) (pediatric): Secondary | ICD-10-CM | POA: Diagnosis present

## 2019-11-12 DIAGNOSIS — R079 Chest pain, unspecified: Secondary | ICD-10-CM | POA: Diagnosis present

## 2019-11-12 LAB — BASIC METABOLIC PANEL
Anion gap: 14 (ref 5–15)
BUN: 17 mg/dL (ref 8–23)
CO2: 21 mmol/L — ABNORMAL LOW (ref 22–32)
Calcium: 9.6 mg/dL (ref 8.9–10.3)
Chloride: 100 mmol/L (ref 98–111)
Creatinine, Ser: 1.03 mg/dL (ref 0.61–1.24)
GFR calc Af Amer: >60 mL/min (ref >60)
GFR calc non Af Amer: >60 mL/min (ref >60)
Glucose, Bld: 151 mg/dL — ABNORMAL HIGH (ref 70–99)
Potassium: 3.4 mmol/L — ABNORMAL LOW (ref 3.5–5.1)
Sodium: 135 mmol/L (ref 135–145)

## 2019-11-12 LAB — CBC
HCT: 44.9 % (ref 39.0–52.0)
HCT: 47.2 % (ref 39.0–52.0)
Hemoglobin: 15.8 g/dL (ref 13.0–17.0)
Hemoglobin: 15.7 g/dL (ref 13.0–17.0)
MCH: 32 pg (ref 26.0–34.0)
MCH: 31.3 pg (ref 26.0–34.0)
MCHC: 35.2 g/dL (ref 30.0–36.0)
MCHC: 33.3 g/dL (ref 30.0–36.0)
MCV: 91.1 fL (ref 80.0–100.0)
MCV: 94.2 fL (ref 80.0–100.0)
Platelets: 210 10*3/uL (ref 150–400)
Platelets: 192 10*3/uL (ref 150–400)
RBC: 4.93 MIL/uL (ref 4.22–5.81)
RBC: 5.01 MIL/uL (ref 4.22–5.81)
RDW: 12.8 % (ref 11.5–15.5)
RDW: 12.9 % (ref 11.5–15.5)
WBC: 7.2 10*3/uL (ref 4.0–10.5)
WBC: 6.9 10*3/uL (ref 4.0–10.5)
nRBC: 0 % (ref 0.0–0.2)
nRBC: 0 % (ref 0.0–0.2)

## 2019-11-12 LAB — D-DIMER, QUANTITATIVE: D-Dimer, Quant: 0.31 ug/mL-FEU (ref 0.00–0.50)

## 2019-11-12 LAB — BRAIN NATRIURETIC PEPTIDE: B Natriuretic Peptide: 18.4 pg/mL (ref 0.0–100.0)

## 2019-11-12 LAB — CREATININE, SERUM
Creatinine, Ser: 1.16 mg/dL (ref 0.61–1.24)
GFR calc Af Amer: >60 mL/min (ref >60)
GFR calc non Af Amer: >60 mL/min (ref >60)

## 2019-11-12 LAB — TROPONIN I (HIGH SENSITIVITY)
Troponin I (High Sensitivity): 3 ng/L (ref <18)
Troponin I (High Sensitivity): 4 ng/L

## 2019-11-12 LAB — HIV ANTIBODY (ROUTINE TESTING W REFLEX): HIV Screen 4th Generation wRfx: NONREACTIVE

## 2019-11-12 LAB — SARS CORONAVIRUS 2 (TAT 6-24 HRS): SARS Coronavirus 2: NEGATIVE

## 2019-11-12 MED ORDER — ACETAMINOPHEN 325 MG PO TABS
650.0000 mg | ORAL_TABLET | ORAL | Status: DC | PRN
  Filled 2019-11-12: qty 2

## 2019-11-12 MED ORDER — SIMVASTATIN 20 MG PO TABS
40.0000 mg | ORAL_TABLET | Freq: Every evening | ORAL | Status: DC
  Administered 2019-11-12: 40 mg via ORAL
  Filled 2019-11-12: qty 2

## 2019-11-12 MED ORDER — FENOFIBRATE 160 MG PO TABS
160.0000 mg | ORAL_TABLET | Freq: Every day | ORAL | Status: DC
  Filled 2019-11-12 (×3): qty 1

## 2019-11-12 MED ORDER — HEPARIN SODIUM (PORCINE) 5000 UNIT/ML IJ SOLN
5000.0000 [IU] | Freq: Three times a day (TID) | INTRAMUSCULAR | Status: DC
  Administered 2019-11-12 – 2019-11-13 (×2): 5000 [IU] via SUBCUTANEOUS
  Filled 2019-11-12 (×2): qty 1

## 2019-11-12 MED ORDER — SODIUM CHLORIDE 0.9% FLUSH: 3.0000 mL | INTRAVENOUS | Status: DC | PRN

## 2019-11-12 MED ORDER — ONDANSETRON HCL 4 MG/2ML IJ SOLN: 4.0000 mg | Freq: Four times a day (QID) | INTRAMUSCULAR | Status: DC | PRN

## 2019-11-12 MED ORDER — ASPIRIN 81 MG PO CHEW
81.0000 mg | CHEWABLE_TABLET | ORAL | Status: AC
  Administered 2019-11-13: 81 mg via ORAL
  Filled 2019-11-12: qty 1

## 2019-11-12 MED ORDER — SODIUM CHLORIDE 0.9 % IV SOLN: 250.0000 mL | INTRAVENOUS | Status: DC | PRN

## 2019-11-12 MED ORDER — ASPIRIN EC 81 MG PO TBEC
81.0000 mg | DELAYED_RELEASE_TABLET | Freq: Every day | ORAL | Status: DC
  Administered 2019-11-12: 81 mg via ORAL

## 2019-11-12 MED ORDER — ASPIRIN EC 81 MG PO TBEC
81.0000 mg | DELAYED_RELEASE_TABLET | Freq: Every day | ORAL | Status: DC
  Filled 2019-11-12: qty 1

## 2019-11-12 MED ORDER — NITROGLYCERIN 0.4 MG SL SUBL: 0.4000 mg | SUBLINGUAL_TABLET | SUBLINGUAL | Status: DC | PRN

## 2019-11-12 MED ORDER — SODIUM CHLORIDE 0.9% FLUSH
3.0000 mL | Freq: Once | INTRAVENOUS | Status: AC
  Administered 2019-11-12: 3 mL via INTRAVENOUS

## 2019-11-12 MED ORDER — SODIUM CHLORIDE 0.9% FLUSH
3.0000 mL | Freq: Two times a day (BID) | INTRAVENOUS | Status: DC
  Administered 2019-11-12: 3 mL via INTRAVENOUS

## 2019-11-12 MED ORDER — SODIUM CHLORIDE 0.9 % IV SOLN: INTRAVENOUS | Status: DC

## 2019-11-12 MED ORDER — METOPROLOL SUCCINATE ER 25 MG PO TB24
25.0000 mg | ORAL_TABLET | Freq: Every day | ORAL | Status: DC
  Administered 2019-11-12: 25 mg via ORAL
  Filled 2019-11-12: qty 1

## 2019-11-12 MED ORDER — FUROSEMIDE 10 MG/ML IJ SOLN
80.0000 mg | Freq: Once | INTRAMUSCULAR | Status: AC
  Administered 2019-11-12: 80 mg via INTRAVENOUS
  Filled 2019-11-12: qty 8

## 2019-11-12 MED ORDER — LEVOTHYROXINE SODIUM 75 MCG PO TABS: 150.0000 ug | ORAL_TABLET | Freq: Every day | ORAL | Status: DC

## 2019-11-12 MED ORDER — ISOSORBIDE MONONITRATE ER 30 MG PO TB24
15.0000 mg | ORAL_TABLET | Freq: Every day | ORAL | Status: DC
  Administered 2019-11-12: 15 mg via ORAL
  Filled 2019-11-12: qty 1

## 2019-11-12 MED ORDER — LORATADINE 10 MG PO TABS
10.0000 mg | ORAL_TABLET | Freq: Every day | ORAL | Status: DC
  Filled 2019-11-12: qty 1

## 2019-11-12 NOTE — Progress Notes (Deleted)
Cardiology Office Note    Date:  11/12/2019   ID:  [name], DOB September 12, 1951, MRN [id number]  PCP:  Martin Stains, MD  Cardiologist:  ***  Electrophysiologist: ***  Chief Complaint: Hospital follow up s/p    /  Months follow up  History of Present Illness:   Martin Hopkins is a 69 y.o. male ***   Past Medical History:  Diagnosis Date  . Adenomatous polyp    Dr Cristina Gong  . Allergic rhinitis   . ED (erectile dysfunction)   . Fatty liver   . GERD (gastroesophageal reflux disease)    mild  . H/O: duodenal ulcer 1982  . Hypercholesteremia   . Hypertension   . Hypogonadism male   . Hypothyroidism   . Lumbar disc disease    L5--Dr Cay Schillings  . Obesity (BMI 30-39.9)   . OSA (obstructive sleep apnea)    AHI 40/hr now on CPAP 9cm H2O    Past Surgical History:  Procedure Laterality Date  . LUMBAR LAMINECTOMY  09/28/2011   Procedure: MICRODISCECTOMY LUMBAR LAMINECTOMY;  Surgeon: Tobi Bastos;  Location: [facility];  Service: Orthopedics;  Laterality: Left;  Hemi-Laminectomy, Microdiscectomy  Lumbar 5 - Sacral 1 Left    . NASAL FRACTURE SURGERY  10/1971  . OTHER SURGICAL HISTORY  1972   nasal surgery   . OTHER SURGICAL HISTORY  2011   right trigger finger surgey   . right hand surgery     remove knots  . VASECTOMY      Current Medications: Prior to Admission medications   Medication Sig Start Date End Date Taking? Authorizing Provider  aspirin EC 81 MG tablet Take 1 tablet (81 mg total) by mouth daily. 07/11/17   Dunn, Nedra Hai, PA-C  cetirizine (ZYRTEC) 10 MG tablet Take 10 mg by mouth every other day.    [provider]  fenofibrate 160 MG tablet Take 1 tablet (160 mg total) by mouth daily. 09/13/19   Sueanne Margarita, MD  furosemide (LASIX) 20 MG tablet Take 1 tablet (20 mg total) by mouth daily. 11/09/19   Pattricia Boss, MD  Icosapent Ethyl 1 g CAPS Take 2 capsules (2 g total) by mouth 2 (two) times a day. 04/05/19 04/04/20  Sueanne Margarita, MD  isosorbide  mononitrate (IMDUR) 30 MG 24 hr tablet Take 0.5 tablets (15 mg total) by mouth daily. 03/27/19 03/26/20  Sueanne Margarita, MD  levothyroxine (SYNTHROID, LEVOTHROID) 150 MCG tablet Take 150 mcg by mouth daily before breakfast.    [provider]  metoprolol succinate (TOPROL XL) 25 MG 24 hr tablet Take 1 tablet (25 mg total) by mouth daily. 11/10/19   Kroeger, Lorelee Cover., PA-C  metoprolol tartrate (LOPRESSOR) 100 MG tablet Take 1 tablet (100 mg total) by mouth once for 1 dose. 2 hours before your Cardiac CT 03/13/19 03/13/19  Sueanne Margarita, MD  simvastatin (ZOCOR) 40 MG tablet Take 40 mg by mouth every evening. 09/09/14   [provider]    Allergies:   Penicillins and Lipitor [atorvastatin]   Social History   Socioeconomic History  . Marital status: Married    Spouse name: Not on file  . Number of children: Not on file  . Years of education: Not on file  . Highest education level: Not on file  Occupational History  . Not on file  Tobacco Use  . Smoking status: Former Smoker    Quit date: 11/07/1990    Years since quitting: 29.0  .  Smokeless tobacco: Never Used  Substance and Sexual Activity  . Alcohol use: Yes    Comment: rare  . Drug use: No  . Sexual activity: Not on file  Other Topics Concern  . Not on file  Social History Narrative  . Not on file   Social Determinants of Health   Financial Resource Strain:   . Difficulty of Paying Living Expenses: Not on file  Food Insecurity:   . Worried About Charity fundraiser in the Last Year: Not on file  . Ran Out of Food in the Last Year: Not on file  Transportation Needs:   . Lack of Transportation (Medical): Not on file  . Lack of Transportation (Non-Medical): Not on file  Physical Activity:   . Days of Exercise per Week: Not on file  . Minutes of Exercise per Session: Not on file  Stress:   . Feeling of Stress : Not on file  Social Connections:   . Frequency of Communication with Friends and Family: Not on file    . Frequency of Social Gatherings with Friends and Family: Not on file  . Attends Religious Services: Not on file  . Active Member of Clubs or Organizations: Not on file  . Attends Archivist Meetings: Not on file  . Marital Status: Not on file     Family History:  The patient's family history includes CAD in his father and paternal grandfather; Hypertension in his brother and mother; Peripheral vascular disease in his brother and father. ***  ROS:   Please see the history of present illness.    ROS All other systems reviewed and are negative.   PHYSICAL EXAM:   VS:  There were no vitals taken for this visit.   GEN: Well nourished, well developed, in no acute distress HEENT: normal Neck: no JVD, carotid bruits, or masses Cardiac: ***RRR; no murmurs, rubs, or gallops,no edema  Respiratory:  clear to auscultation bilaterally, normal work of breathing GI: soft, nontender, nondistended, + BS MS: no deformity or atrophy Skin: warm and dry, no rash Neuro:  Alert and Oriented x 3, Strength and sensation are intact Psych: euthymic mood, full affect  Wt Readings from Last 3 Encounters:  03/04/19 247 lb (112 kg)  10/09/17 251 lb (113.9 kg)  09/27/17 251 lb 6.4 oz (114 kg)      Studies/Labs Reviewed:   EKG:  EKG is ordered today.  The ekg ordered today demonstrates ***  Recent Labs: 09/09/2019: ALT 57 11/09/2019: B Natriuretic Peptide 182.5 11/12/2019: BUN 17; Creatinine, Ser 1.03; Hemoglobin 15.8; Platelets 210; Potassium 3.4; Sodium 135   Lipid Panel    Component Value Date/Time   CHOL 138 09/09/2019 0809   TRIG 186 (H) 09/09/2019 0809   HDL 31 (L) 09/09/2019 0809   CHOLHDL 4.5 09/09/2019 0809   LDLCALC 75 09/09/2019 0809    Additional studies/ records that were reviewed today include:   Echocardiogram:  Cardiac Catheterization:     ASSESSMENT & PLAN:    1. ***    Medication Adjustments/Labs and Tests Ordered: Current medicines are reviewed at length  with the patient today.  Concerns regarding medicines are outlined above.  Medication changes, Labs and Tests ordered today are listed in the Patient Instructions below. There are no Patient Instructions on file for this visit.   Jarrett Soho, [place]  11/12/2019 12:16 PM    [facility] [address] [place] [address], [place]  [address] Phone: [phone]; Fax: [phone]  

## 2019-11-12 NOTE — Telephone Encounter (Signed)
New Message  Pt is calling to return phone message from [place]  Please call

## 2019-11-12 NOTE — ED Notes (Signed)
Contacted Cardiology to follow up with pending admission orders. Cardiology to call back RN with update.

## 2019-11-12 NOTE — Telephone Encounter (Signed)
Patient is calling back to see what he should do (see previous phone note). He is in the ER waiting room and I have advised him to stay. Dr. Radford Pax is aware and has agreed that he needs to stay there to be checked out. Patient verbalized understanding.

## 2019-11-12 NOTE — ED Notes (Signed)
Pt ambulated to restroom, no shortness of breath noted.

## 2019-11-12 NOTE — ED Triage Notes (Signed)
Pt arrived by ems from home. Recently here for sob, diagnosed with chf and dc home with lasix. Reports having temporary relief and now increase in sob with exertion and when lying down to sleep. spo2 98% on room air, no resp distress noted on arrival.

## 2019-11-12 NOTE — ED Provider Notes (Signed)
[facility] EMERGENCY DEPARTMENT Provider Note   CSN: [id number] Arrival date & time: 11/12/19  [id number]     History Chief Complaint  Patient presents with  . Shortness of Breath    Martin Hopkins is a 69 y.o. male with PMH significant for OSA, obesity, HTN, HLD, and recently diagnosed CHF who presents to the ED for worsening shortness of breath symptoms despite taking Lasix as prescribed.  Patient is followed by Fransico Him MD at [facility].  He endorses a 2-week history of noticeable dyspnea on exertion and a 12 pound increase in weight over past 6 weeks.  On 11/09/2019 he presented to the ED for his difficulty breathing and was given 40 mg IM furosemide and discharged with 20 mg p.o. furosemide.  Patient reports feeling improved the following day, but that it was short-lived and he has since continued to experience difficulty breathing, particularly on exertion.  [facility] spoke with [facility] who increase his furosemide to 60 mg p.o. daily and started him on metoprolol.  He states that his legs were swollen and edematous for his 11/09/2019 ED visit, but that they have improved.  He endorses significant orthopnea and requires CPAP at night for his OSA.  He has an echocardiogram scheduled for tomorrow, but his shortness of breath symptoms prompted him to return to the ED for further evaluation.  He denies any fevers or chills, headache or dizziness, chest pain, cough, abdominal pain, nausea or vomiting, or any other symptoms.    HPI     Past Medical History:  Diagnosis Date  . Adenomatous polyp    Dr Cristina Gong  . Allergic rhinitis   . ED (erectile dysfunction)   . Fatty liver   . GERD (gastroesophageal reflux disease)    mild  . H/O: duodenal ulcer 1982  . Hypercholesteremia   . Hypertension   . Hypogonadism male   . Hypothyroidism   . Lumbar disc disease    L5--Dr Cay Schillings  . Obesity (BMI 30-39.9)   . OSA (obstructive sleep apnea)    AHI 40/hr now on CPAP 9cm H2O    Patient  Active Problem List   Diagnosis Date Noted  . Chest pain 09/27/2017  . OSA (obstructive sleep apnea) 01/08/2014  . Essential hypertension, benign 01/08/2014  . Obesity (BMI 30-39.9)     Past Surgical History:  Procedure Laterality Date  . LUMBAR LAMINECTOMY  09/28/2011   Procedure: MICRODISCECTOMY LUMBAR LAMINECTOMY;  Surgeon: Tobi Bastos;  Location: [facility];  Service: Orthopedics;  Laterality: Left;  Hemi-Laminectomy, Microdiscectomy  Lumbar 5 - Sacral 1 Left    . NASAL FRACTURE SURGERY  10/1971  . OTHER SURGICAL HISTORY  1972   nasal surgery   . OTHER SURGICAL HISTORY  2011   right trigger finger surgey   . right hand surgery     remove knots  . VASECTOMY         Family History  Problem Relation Age of Onset  . Hypertension Mother   . CAD Father   . Peripheral vascular disease Father   . Peripheral vascular disease Brother   . Hypertension Brother   . CAD Paternal Grandfather     Social History   Tobacco Use  . Smoking status: Former Smoker    Quit date: 11/07/1990    Years since quitting: 29.0  . Smokeless tobacco: Never Used  Substance Use Topics  . Alcohol use: Yes    Comment: rare  . Drug use: No    Home  Medications Prior to Admission medications   Medication Sig Start Date End Date Taking? Authorizing Provider  aspirin EC 81 MG tablet Take 1 tablet (81 mg total) by mouth daily. 07/11/17   Dunn, Nedra Hai, PA-C  cetirizine (ZYRTEC) 10 MG tablet Take 10 mg by mouth every other day.    [provider]  fenofibrate 160 MG tablet Take 1 tablet (160 mg total) by mouth daily. 09/13/19   Sueanne Margarita, MD  furosemide (LASIX) 20 MG tablet Take 1 tablet (20 mg total) by mouth daily. 11/09/19   Pattricia Boss, MD  Icosapent Ethyl 1 g CAPS Take 2 capsules (2 g total) by mouth 2 (two) times a day. 04/05/19 04/04/20  Sueanne Margarita, MD  isosorbide mononitrate (IMDUR) 30 MG 24 hr tablet Take 0.5 tablets (15 mg total) by mouth daily. 03/27/19 03/26/20  Sueanne Margarita,  MD  levothyroxine (SYNTHROID, LEVOTHROID) 150 MCG tablet Take 150 mcg by mouth daily before breakfast.    [provider]  metoprolol succinate (TOPROL XL) 25 MG 24 hr tablet Take 1 tablet (25 mg total) by mouth daily. 11/10/19   Kroeger, Lorelee Cover., PA-C  metoprolol tartrate (LOPRESSOR) 100 MG tablet Take 1 tablet (100 mg total) by mouth once for 1 dose. 2 hours before your Cardiac CT 03/13/19 03/13/19  Sueanne Margarita, MD  simvastatin (ZOCOR) 40 MG tablet Take 40 mg by mouth every evening. 09/09/14   [provider]    Allergies    Penicillins and Lipitor [atorvastatin]  Review of Systems   Review of Systems  All other systems reviewed and are negative.   Physical Exam Updated Vital Signs BP 129/77   Pulse 80   Temp 99.1 F (37.3 C) (Oral)   Resp 13   SpO2 98%   Physical Exam Vitals and nursing note reviewed. Exam conducted with a chaperone present.  Constitutional:      General: He is not in acute distress.    Appearance: Normal appearance.  HENT:     Head: Normocephalic and atraumatic.  Eyes:     General: No scleral icterus.    Conjunctiva/sclera: Conjunctivae normal.  Neck:     Comments: No obvious JVD. Cardiovascular:     Rate and Rhythm: Normal rate and regular rhythm.     Pulses: Normal pulses.     Heart sounds: Normal heart sounds.  Pulmonary:     Comments: Minimally increased work of breathing, patient is neither tachypneic nor hypoxic on my exam.  No accessory muscle use.  Able to speak full sentences without difficulty.  Breath sounds intact bilaterally.  No obvious abnormal breath sounds. Musculoskeletal:     Comments: No significant lower leg edema.  Skin:    General: Skin is dry.  Neurological:     Mental Status: He is alert and oriented to person, place, and time.     GCS: GCS eye subscore is 4. GCS verbal subscore is 5. GCS motor subscore is 6.  Psychiatric:        Mood and Affect: Mood normal.        Behavior: Behavior normal.         Thought Content: Thought content normal.     ED Results / Procedures / Treatments   Labs (all labs ordered are listed, but only abnormal results are displayed) Labs Reviewed  BASIC METABOLIC PANEL - Abnormal; Notable for the following components:      Result Value   Potassium 3.4 (*)    CO2  21 (*)    Glucose, Bld 151 (*)    All other components within normal limits  SARS CORONAVIRUS 2 (TAT 6-24 HRS)  CBC  BRAIN NATRIURETIC PEPTIDE  D-DIMER, QUANTITATIVE (NOT AT [facility])  TROPONIN I (HIGH SENSITIVITY)    EKG None  Radiology DG Chest 2 View  Result Date: 11/12/2019 CLINICAL DATA:  Shortness of breath, recent diagnosis of CHF EXAM: CHEST - 2 VIEW COMPARISON:  Radiograph 11/09/2019 FINDINGS: No consolidation, features of edema, pneumothorax, or effusion. Pulmonary vascularity is normally distributed. The cardiomediastinal contours are unremarkable. No acute osseous or soft tissue abnormality. Cervical fusion noted. Degenerative changes are present in the imaged spine and shoulders. IMPRESSION: No acute cardiopulmonary abnormality. Electronically Signed   By: Lovena Le M.D.   On: 11/12/2019 03:54    Procedures Procedures (including critical care time)  Medications Ordered in ED Medications  sodium chloride flush (NS) 0.9 % injection 3 mL (3 mLs Intravenous Given 11/12/19 1351)  furosemide (LASIX) injection 80 mg (80 mg Intravenous Given 11/12/19 1350)    ED Course  I have reviewed the triage vital signs and the nursing notes.  Pertinent labs & imaging results that were available during my care of the patient were reviewed by me and considered in my medical decision making (see chart for details).  Clinical Course as of Nov 11 1609  Tue Nov 12, 2019  1354 Spoke with Mancel Bale NP who will notify Cardiology Master of patient to determine plan.    [GG]    Clinical Course User Index [GG] Corena Herter, PA-C   MDM Rules/Calculators/A&P                      DG chest  demonstrates no pleural effusion or other acute cardiopulmonary abnormalities.  CBC and BMP unremarkable.    Echocardiogram is scheduled for tomorrow.  Received a phone call from Dr. Marzetta Board who made me aware of an outpatient CHF Freedom trial utilizing a diuretic pump as opposed to IV diuresis.  He spoke with Dr. Zoila Shutter who felt 80 mg IV Lasix was appropriate for this patient at this time, with BNP still pending, given suspicion of acute CHF exacerbation in setting of his recent diagnosis.    Spoke with NP Mancel Bale who will notify cardiology Master of patient here in the ED.  Given that he was instructed by his cardiologist, Dr. Radford Pax, to come to the ED for evaluation, wanted to be sure that and was on the same page before discharging home with Lasix pump.  Patient was observed by RN able to ambulate to the restroom after receiving 80 mg IV Lasix without any obvious difficulty breathing.  6 to 24-hour COVID-19 testing obtained.    Shortly thereafter, patient BNP came back well within normal limits at 18.4.  Shortly after however, patient once again spontaneously became tachypneic and short of breath.  Will obtain delta troponin and D-dimer.  6 to 24-hour COVID-19 testing obtained.  If negative, patient is safe for discharge.    At shift change care was transferred to [facility], PA-C who will follow pending studies, re-evaluate, and determine disposition.     Final Clinical Impression(s) / ED Diagnoses Final diagnoses:  Dyspnea, unspecified type    Rx / DC Orders ED Discharge Orders    None       Corena Herter, PA-C 11/12/19 [place], [place], MD 11/13/19 [id number]    Varney Biles, MD 11/13/19 [phone]

## 2019-11-12 NOTE — Telephone Encounter (Signed)
Agree he needs to stay there to be evaluated

## 2019-11-12 NOTE — H&P (Addendum)
Cardiology History and Physical:   Patient ID: Martin Hopkins MRN: [id number]; DOB: 05-27-1951  Admit date: 11/12/2019 Date of Consult: 11/12/2019  Primary Care Provider: Harlan Stains, MD Primary Cardiologist: Fransico Him, MD  Primary Electrophysiologist:  None    Patient Profile:   Martin Hopkins is a 69 y.o. male with a hx of OSA on CPAP, HTN, HLD, obesity, GERD, and duodenal ulcer who is being seen today for the evaluation of CHF exacerbation at the request of Dr. Kathrynn Humble.  History of Present Illness:   Martin Hopkins was last seen in clinic by Dr. Radford Hopkins on 03/04/19. At that time, he reported chest tightness 3-4 times per week on the right side without radiation. Last nuclear stress test negative for reversible ischemia in 2018. Given his risk factors, he underwent CT coronary which revealed normal coronaries without CAD 03/2019. He was seen in consult on 11/09/19.  He reported shortness of breath that woke him from sleep. In the ER, he had a mildly elevated BNP 183, negative D-dimer, and negative hs troponin negative x 2. His COVID test was negative. He was evaluated by Dr. Audie Box who appreciated lower extremity edema and suspected possible onset of CHF. He  recommended OTO 40 mg IV lasix and discharged on 20 mg PO lasix with close cardiology follow up. He states that his lower extemity swelling improved after lasix. However, he suffered another bout of SOB yesterday that he "couldn't walk off." He called our office who recommended increasing lasix to 60 mg x 3 days. Unfortunately, he presented back to the ER with increased DOE and orthopnea on 11/12/19. He continues to have bouts of SOB that aren't sustained. He also continues to report he same chest pain that he has had since April 2020.   BNP normal, D-dimer negative CXR negative for acute cardiopulmonary process HS troponin negative EKG remains nonischemic   Heart Pathway Score:  HEAR Score: 4  Past Medical History:  Diagnosis Date  .  Adenomatous polyp    Dr Cristina Gong  . Allergic rhinitis   . ED (erectile dysfunction)   . Fatty liver   . GERD (gastroesophageal reflux disease)    mild  . H/O: duodenal ulcer 1982  . Hypercholesteremia   . Hypertension   . Hypogonadism male   . Hypothyroidism   . Lumbar disc disease    L5--Dr Cay Schillings  . Obesity (BMI 30-39.9)   . OSA (obstructive sleep apnea)    AHI 40/hr now on CPAP 9cm H2O    Past Surgical History:  Procedure Laterality Date  . LUMBAR LAMINECTOMY  09/28/2011   Procedure: MICRODISCECTOMY LUMBAR LAMINECTOMY;  Surgeon: Tobi Bastos;  Location: [facility];  Service: Orthopedics;  Laterality: Left;  Hemi-Laminectomy, Microdiscectomy  Lumbar 5 - Sacral 1 Left    . NASAL FRACTURE SURGERY  10/1971  . OTHER SURGICAL HISTORY  1972   nasal surgery   . OTHER SURGICAL HISTORY  2011   right trigger finger surgey   . right hand surgery     remove knots  . VASECTOMY       Home Medications:  Prior to Admission medications   Medication Sig Start Date End Date Taking? Authorizing Provider  aspirin EC 81 MG tablet Take 1 tablet (81 mg total) by mouth daily. 07/11/17   Dunn, Nedra Hai, PA-C  cetirizine (ZYRTEC) 10 MG tablet Take 10 mg by mouth every other day.    [provider]  fenofibrate 160 MG tablet Take 1 tablet (160 mg  total) by mouth daily. 09/13/19   Sueanne Margarita, MD  furosemide (LASIX) 20 MG tablet Take 1 tablet (20 mg total) by mouth daily. 11/09/19   Pattricia Boss, MD  Icosapent Ethyl 1 g CAPS Take 2 capsules (2 g total) by mouth 2 (two) times a day. 04/05/19 04/04/20  Sueanne Margarita, MD  isosorbide mononitrate (IMDUR) 30 MG 24 hr tablet Take 0.5 tablets (15 mg total) by mouth daily. 03/27/19 03/26/20  Sueanne Margarita, MD  levothyroxine (SYNTHROID, LEVOTHROID) 150 MCG tablet Take 150 mcg by mouth daily before breakfast.    [provider]  metoprolol succinate (TOPROL XL) 25 MG 24 hr tablet Take 1 tablet (25 mg total) by mouth daily. 11/10/19   Kroeger,  Lorelee Cover., PA-C  metoprolol tartrate (LOPRESSOR) 100 MG tablet Take 1 tablet (100 mg total) by mouth once for 1 dose. 2 hours before your Cardiac CT 03/13/19 03/13/19  Sueanne Margarita, MD  simvastatin (ZOCOR) 40 MG tablet Take 40 mg by mouth every evening. 09/09/14   [provider]    Inpatient Medications: Scheduled Meds:  Continuous Infusions:  PRN Meds:   Allergies:    Allergies  Allergen Reactions  . Penicillins Shortness Of Breath  . Lipitor [Atorvastatin]     Joint pain     Social History:   Social History   Socioeconomic History  . Marital status: Married    Spouse name: Not on file  . Number of children: Not on file  . Years of education: Not on file  . Highest education level: Not on file  Occupational History  . Not on file  Tobacco Use  . Smoking status: Former Smoker    Quit date: 11/07/1990    Years since quitting: 29.0  . Smokeless tobacco: Never Used  Substance and Sexual Activity  . Alcohol use: Yes    Comment: rare  . Drug use: No  . Sexual activity: Not on file  Other Topics Concern  . Not on file  Social History Narrative  . Not on file   Social Determinants of Health   Financial Resource Strain:   . Difficulty of Paying Living Expenses: Not on file  Food Insecurity:   . Worried About Charity fundraiser in the Last Year: Not on file  . Ran Out of Food in the Last Year: Not on file  Transportation Needs:   . Lack of Transportation (Medical): Not on file  . Lack of Transportation (Non-Medical): Not on file  Physical Activity:   . Days of Exercise per Week: Not on file  . Minutes of Exercise per Session: Not on file  Stress:   . Feeling of Stress : Not on file  Social Connections:   . Frequency of Communication with Friends and Family: Not on file  . Frequency of Social Gatherings with Friends and Family: Not on file  . Attends Religious Services: Not on file  . Active Member of Clubs or Organizations: Not on file  . Attends English as a second language teacher Meetings: Not on file  . Marital Status: Not on file  Intimate Partner Violence:   . Fear of Current or Ex-Partner: Not on file  . Emotionally Abused: Not on file  . Physically Abused: Not on file  . Sexually Abused: Not on file    Family History:    Family History  Problem Relation Age of Onset  . Hypertension Mother   . CAD Father   . Peripheral vascular disease Father   .  Peripheral vascular disease Brother   . Hypertension Brother   . CAD Paternal Grandfather      ROS:  Please see the history of present illness.   All other ROS reviewed and negative.     Physical Exam/Data:   Vitals:   11/12/19 1545 11/12/19 1600 11/12/19 1604 11/12/19 1615  BP: 134/76 129/77  116/83  Pulse: 86 80  80  Resp: (!) 22 13  11   Temp:   99.1 F (37.3 C)   TempSrc:   Oral   SpO2: 99% 98%  98%    Intake/Output Summary (Last 24 hours) at 11/12/2019 1650 Last data filed at 11/12/2019 1527 Gross per 24 hour  Intake --  Output 1100 ml  Net -1100 ml   Last 3 Weights 03/04/2019 10/09/2017 09/27/2017  Weight (lbs) 247 lb 251 lb 251 lb 6.4 oz  Weight (kg) 112.038 kg 113.853 kg 114.034 kg     There is no height or weight on file to calculate BMI.  General:  Obese male in NAD HEENT: normal Neck: no JVD Vascular: No carotid bruits Cardiac:  normal S1, S2; RRR; no murmur  Lungs:  clear to auscultation bilaterally, no wheezing, rhonchi or rales  Abd: soft, nontender, no hepatomegaly  Ext: no edema Musculoskeletal:  No deformities, BUE and BLE strength normal and equal Skin: warm and dry  Neuro:  CNs 2-12 intact, no focal abnormalities noted Psych:  Normal affect   EKG:  The EKG was personally reviewed and demonstrates:  Sinus rhythm with HR 81 Telemetry:  Telemetry was personally reviewed and demonstrates:  Sinus rhythm in the 70s  Relevant CV Studies:  Echo pending  Laboratory Data:  High Sensitivity Troponin:   Recent Labs  Lab 11/09/19 0730 11/09/19 0926   TROPONINIHS 2 7     Chemistry Recent Labs  Lab 11/09/19 0730 11/12/19 0346  NA 135 135  K 4.1 3.4*  CL 104 100  CO2 20* 21*  GLUCOSE 135* 151*  BUN 22 17  CREATININE 0.99 1.03  CALCIUM 9.1 9.6  GFRNONAA >60 >60  GFRAA >60 >60  ANIONGAP 11 14    No results for input(s): PROT, ALBUMIN, AST, ALT, ALKPHOS, BILITOT in the last 168 hours. Hematology Recent Labs  Lab 11/09/19 0730 11/12/19 0346  WBC 5.8 7.2  RBC 4.58 4.93  HGB 14.5 15.8  HCT 42.9 44.9  MCV 93.7 91.1  MCH 31.7 32.0  MCHC 33.8 35.2  RDW 12.6 12.8  PLT 190 210   BNP Recent Labs  Lab 11/09/19 0926 11/12/19 1215  BNP 182.5* 18.4    DDimer  Recent Labs  Lab 11/09/19 0926  DDIMER <0.27     Radiology/Studies:  DG Chest 2 View  Result Date: 11/12/2019 CLINICAL DATA:  Shortness of breath, recent diagnosis of CHF EXAM: CHEST - 2 VIEW COMPARISON:  Radiograph 11/09/2019 FINDINGS: No consolidation, features of edema, pneumothorax, or effusion. Pulmonary vascularity is normally distributed. The cardiomediastinal contours are unremarkable. No acute osseous or soft tissue abnormality. Cervical fusion noted. Degenerative changes are present in the imaged spine and shoulders. IMPRESSION: No acute cardiopulmonary abnormality. Electronically Signed   By: Lovena Le M.D.   On: 11/12/2019 03:54   DG Chest 2 View  Result Date: 11/09/2019 CLINICAL DATA:  Chest pain. EXAM: CHEST - 2 VIEW COMPARISON:  Chest radiograph 05/17/2016 FINDINGS: Heart size within normal limits. Aortic atherosclerosis. No airspace consolidation. No evidence of pleural effusion or pneumothorax. No acute bony abnormality. Cervical spinal fusion hardware. IMPRESSION: No evidence  of acute cardiopulmonary abnormality. Electronically Signed   By: Kellie Simmering DO   On: 11/09/2019 08:07       HEAR Score (for undifferentiated chest pain):  HEAR Score: 4    Assessment and Plan:   1. SOB, DOE - BNP now normal, but in the setting of obesity - I do not  appreciate JVD or lower extremity swelling  - he received 80 mg IV lasix in the ER, but experienced another bout of SOB following this dose    2. Chest pain - lexiscan myoview negative for reversible ischemia in 2018; CT coronary 03/2019 negative for obstructive CAD but positive for calcium   3. Hypertension - home regimen includes imdur 15 mg and toprol 25 mg daily   4. Hyperlipidemia - continue fenofibrate and zocor - 09/09/2019: Cholesterol, Total 138; HDL 31; LDL Chol Calc (NIH) 75; Triglycerides 186   He denies sick contacts. No leukocytosis. POC COVID was negative. ER is obtaining COVID-19 PCR, repeat D-dimer and hs troponin both negative. CXR is negative for CHF or other acute processes.  I would favor admission for further workup given that this is his second ER visit for the same symptoms in 3 days.  Will order echocardiogram. Will plan for right and left heat catheterization tomorrow.      For questions or updates, please contact [place] Please consult [email or web address] for contact info under   Signed, Ledora Bottcher, PA  11/12/2019 4:50 PM  Patient seen, examined. Available data reviewed. Agree with findings, assessment, and plan as outlined by Doreene Adas, PA.  On my exam, the patient is alert and oriented, in no distress.  HEENT is normal, carotid upstrokes are normal without bruits, lungs are clear, JVP is normal, heart is regular rate and rhythm with no murmur gallop, abdomen soft and nontender with no masses, extremities have trace pretibial edema.  The patient's recent history is reviewed with ER evaluation just a few days ago.  Please see formal consultation by Dr. Audie Box at that time.  I also reviewed the patient's imaging history as he is undergone previous coronary CTA and nuclear stress test.  The CTA study suggested nonobstructive coronary artery disease with negative FFR evaluation.  However, the patient was noted to have a heavy coronary calcium burden which  might have limited the diagnostic accuracy of this test.  The patient was started on furosemide for presumed heart failure.  Unfortunately, he developed recurrent orthopnea, PND, and shortness of breath over the last 48 hours.  He also describes a central chest pressure and heaviness that occurs with this.  His symptoms have been waxing and waning but primarily occurring at nighttime and waking him from sleep.  I think his symptoms are highly concerning for unstable angina.  Fortunately his troponin values are negative to date.  Considering his repeat ER evaluation, background risk of obesity, age, obstructive sleep apnea, hypertension, hyperlipidemia, and family history of coronary disease, I have recommended proceeding with definitive evaluation with right and left heart catheterization and possible PCI. I have reviewed the risks, indications, and alternatives to cardiac catheterization, possible angioplasty, and stenting with the patient. Risks include but are not limited to bleeding, infection, vascular injury, stroke, myocardial infection, arrhythmia, kidney injury, radiation-related injury in the case of prolonged fluoroscopy use, emergency cardiac surgery, and death. The patient understands the risks of serious complication is 1-2 in [id number] with diagnostic cardiac cath and 1-2% or less with angioplasty/stenting.  We will also check an echocardiogram to better  assess systolic and diastolic function in the presence of any valvular disease.  Sherren Hopkins, M.D. 11/12/2019 6:31 PM

## 2019-11-12 NOTE — ED Notes (Signed)
Pt given a sandwich

## 2019-11-12 NOTE — Telephone Encounter (Signed)
Patient is calling from the ER, he has been there for 10 hours. He states his vital signs were normal and all they have done is lab work and a chest x-ray. He states that he can still barely breath, but has been told that they are really backed up. Patient was supposed to have an echocardiogram done in our office this week, ordered by Roby Lofts, PA-C and is asking if he can get that scheduled today. I advised him that he should stay in the ER right now.

## 2019-11-12 NOTE — ED Notes (Signed)
Pt denies CP

## 2019-11-12 NOTE — ED Notes (Signed)
Pt ambulating in room. States he feels dizzy when exerting himself. Instructed pt to call out when requiring mobility assistance.

## 2019-11-13 ENCOUNTER — Encounter (HOSPITAL_COMMUNITY)
Admission: EM | Disposition: A | Discharge status: Home / Self Care | Payer: Self-pay | Source: Home / Self Care | Attending: Cardiovascular Disease

## 2019-11-13 ENCOUNTER — Inpatient Hospital Stay (HOSPITAL_COMMUNITY): Payer: PPO

## 2019-11-13 ENCOUNTER — Ambulatory Visit: Payer: PPO | Admitting: Physician Assistant

## 2019-11-13 DIAGNOSIS — I2510 Atherosclerotic heart disease of native coronary artery without angina pectoris: Secondary | ICD-10-CM

## 2019-11-13 DIAGNOSIS — R0602 Shortness of breath: Secondary | ICD-10-CM

## 2019-11-13 HISTORY — PX: RIGHT/LEFT HEART CATH AND CORONARY ANGIOGRAPHY: CATH118266

## 2019-11-13 LAB — BASIC METABOLIC PANEL
Anion gap: 12 (ref 5–15)
BUN: 20 mg/dL (ref 8–23)
CO2: 27 mmol/L (ref 22–32)
Calcium: 9.6 mg/dL (ref 8.9–10.3)
Chloride: 97 mmol/L — ABNORMAL LOW (ref 98–111)
Creatinine, Ser: 1.31 mg/dL — ABNORMAL HIGH (ref 0.61–1.24)
GFR calc Af Amer: >60 mL/min (ref >60)
GFR calc non Af Amer: 56 mL/min — ABNORMAL LOW (ref >60)
Glucose, Bld: 125 mg/dL — ABNORMAL HIGH (ref 70–99)
Potassium: 3.9 mmol/L (ref 3.5–5.1)
Sodium: 136 mmol/L (ref 135–145)

## 2019-11-13 LAB — POCT I-STAT 7, (LYTES, BLD GAS, ICA,H+H)
Acid-base deficit: 1 mmol/L (ref 0.0–2.0)
Bicarbonate: 23.8 mmol/L (ref 20.0–28.0)
Calcium, Ion: 1.19 mmol/L (ref 1.15–1.40)
HCT: 42 % (ref 39.0–52.0)
Hemoglobin: 14.3 g/dL (ref 13.0–17.0)
O2 Saturation: 98 %
Potassium: 3.5 mmol/L (ref 3.5–5.1)
Sodium: 133 mmol/L — ABNORMAL LOW (ref 135–145)
TCO2: 25 mmol/L (ref 22–32)
pCO2 arterial: 40.9 mmHg (ref 32.0–48.0)
pH, Arterial: 7.373 (ref 7.350–7.450)
pO2, Arterial: 116 mmHg — ABNORMAL HIGH (ref 83.0–108.0)

## 2019-11-13 LAB — CBC
HCT: 44.8 % (ref 39.0–52.0)
Hemoglobin: 15.3 g/dL (ref 13.0–17.0)
MCH: 31.5 pg (ref 26.0–34.0)
MCHC: 34.2 g/dL (ref 30.0–36.0)
MCV: 92.2 fL (ref 80.0–100.0)
Platelets: 208 10*3/uL (ref 150–400)
RBC: 4.86 MIL/uL (ref 4.22–5.81)
RDW: 12.8 % (ref 11.5–15.5)
WBC: 7 10*3/uL (ref 4.0–10.5)
nRBC: 0 % (ref 0.0–0.2)

## 2019-11-13 LAB — LIPID PANEL
Cholesterol: 247 mg/dL — ABNORMAL HIGH (ref 0–200)
HDL: 34 mg/dL — ABNORMAL LOW (ref >40)
LDL Cholesterol: 185 mg/dL — ABNORMAL HIGH (ref 0–99)
Total CHOL/HDL Ratio: 7.3 RATIO
Triglycerides: 141 mg/dL (ref <150)
VLDL: 28 mg/dL (ref 0–40)

## 2019-11-13 LAB — POCT I-STAT EG7
Acid-Base Excess: 2 mmol/L (ref 0.0–2.0)
Bicarbonate: 27.3 mmol/L (ref 20.0–28.0)
Bicarbonate: 25.9 mmol/L (ref 20.0–28.0)
Calcium, Ion: 1.21 mmol/L (ref 1.15–1.40)
Calcium, Ion: 1.19 mmol/L (ref 1.15–1.40)
HCT: 43 % (ref 39.0–52.0)
HCT: 44 % (ref 39.0–52.0)
Hemoglobin: 14.6 g/dL (ref 13.0–17.0)
Hemoglobin: 15 g/dL (ref 13.0–17.0)
O2 Saturation: 70 %
O2 Saturation: 73 %
Potassium: 3.5 mmol/L (ref 3.5–5.1)
Potassium: 3.5 mmol/L (ref 3.5–5.1)
Sodium: 136 mmol/L (ref 135–145)
Sodium: 136 mmol/L (ref 135–145)
TCO2: 29 mmol/L (ref 22–32)
TCO2: 27 mmol/L (ref 22–32)
pCO2, Ven: 44.6 mmHg (ref 44.0–60.0)
pCO2, Ven: 43.6 mmHg — ABNORMAL LOW (ref 44.0–60.0)
pH, Ven: 7.395 (ref 7.250–7.430)
pH, Ven: 7.382 (ref 7.250–7.430)
pO2, Ven: 37 mmHg (ref 32.0–45.0)
pO2, Ven: 40 mmHg (ref 32.0–45.0)

## 2019-11-13 LAB — ECHOCARDIOGRAM COMPLETE
Height: 70 in
Weight: 4016 oz

## 2019-11-13 SURGERY — RIGHT/LEFT HEART CATH AND CORONARY ANGIOGRAPHY
Anesthesia: LOCAL

## 2019-11-13 MED ORDER — NITROGLYCERIN 0.4 MG SL SUBL: 0.4000 mg | SUBLINGUAL_TABLET | SUBLINGUAL | 3 refills | Status: AC | PRN

## 2019-11-13 MED ORDER — SODIUM CHLORIDE 0.9 % IV SOLN: INTRAVENOUS | Status: DC

## 2019-11-13 MED ORDER — LIDOCAINE HCL (PF) 1 % IJ SOLN
INTRAMUSCULAR | Status: AC
  Filled 2019-11-13: qty 30

## 2019-11-13 MED ORDER — HEPARIN (PORCINE) IN NACL 1000-0.9 UT/500ML-% IV SOLN
INTRAVENOUS | Status: DC | PRN
  Administered 2019-11-13: 500 mL

## 2019-11-13 MED ORDER — IOHEXOL 350 MG/ML SOLN
100.0000 mL | Freq: Once | INTRAVENOUS | Status: AC | PRN
  Administered 2019-11-13: 100 mL via INTRAVENOUS

## 2019-11-13 MED ORDER — LIDOCAINE HCL (PF) 1 % IJ SOLN
INTRAMUSCULAR | Status: DC | PRN
  Administered 2019-11-13 (×2): 2 mL

## 2019-11-13 MED ORDER — SODIUM CHLORIDE 0.9 % IV SOLN: 250.0000 mL | INTRAVENOUS | Status: DC | PRN

## 2019-11-13 MED ORDER — HYDRALAZINE HCL 20 MG/ML IJ SOLN: 10.0000 mg | INTRAMUSCULAR | Status: DC | PRN

## 2019-11-13 MED ORDER — HEPARIN (PORCINE) IN NACL 1000-0.9 UT/500ML-% IV SOLN
INTRAVENOUS | Status: AC
  Filled 2019-11-13: qty 1000

## 2019-11-13 MED ORDER — SODIUM CHLORIDE 0.9% FLUSH: 3.0000 mL | Freq: Two times a day (BID) | INTRAVENOUS | Status: DC

## 2019-11-13 MED ORDER — FENTANYL CITRATE (PF) 100 MCG/2ML IJ SOLN
INTRAMUSCULAR | Status: AC
  Filled 2019-11-13: qty 2

## 2019-11-13 MED ORDER — ACETAMINOPHEN 325 MG PO TABS
650.0000 mg | ORAL_TABLET | ORAL | Status: DC | PRN
  Administered 2019-11-13: 650 mg via ORAL

## 2019-11-13 MED ORDER — VERAPAMIL HCL 2.5 MG/ML IV SOLN
INTRAVENOUS | Status: AC
  Filled 2019-11-13: qty 2

## 2019-11-13 MED ORDER — MIDAZOLAM HCL 2 MG/2ML IJ SOLN
INTRAMUSCULAR | Status: AC
  Filled 2019-11-13: qty 2

## 2019-11-13 MED ORDER — SODIUM CHLORIDE 0.9% FLUSH: 3.0000 mL | INTRAVENOUS | Status: DC | PRN

## 2019-11-13 MED ORDER — HEPARIN SODIUM (PORCINE) 5000 UNIT/ML IJ SOLN: 5000.0000 [IU] | Freq: Three times a day (TID) | INTRAMUSCULAR | Status: DC

## 2019-11-13 MED ORDER — AMLODIPINE BESYLATE 2.5 MG PO TABS: 2.5000 mg | ORAL_TABLET | Freq: Every day | ORAL | 6 refills | Status: DC

## 2019-11-13 MED ORDER — HEPARIN SODIUM (PORCINE) 1000 UNIT/ML IJ SOLN
INTRAMUSCULAR | Status: DC | PRN
  Administered 2019-11-13: 5500 [IU] via INTRAVENOUS

## 2019-11-13 MED ORDER — ONDANSETRON HCL 4 MG/2ML IJ SOLN: 4.0000 mg | Freq: Four times a day (QID) | INTRAMUSCULAR | Status: DC | PRN

## 2019-11-13 MED ORDER — MIDAZOLAM HCL 2 MG/2ML IJ SOLN
INTRAMUSCULAR | Status: DC | PRN
  Administered 2019-11-13 (×2): 1 mg via INTRAVENOUS

## 2019-11-13 MED ORDER — LABETALOL HCL 5 MG/ML IV SOLN: 10.0000 mg | INTRAVENOUS | Status: DC | PRN

## 2019-11-13 MED ORDER — HEPARIN SODIUM (PORCINE) 1000 UNIT/ML IJ SOLN
INTRAMUSCULAR | Status: AC
  Filled 2019-11-13: qty 1

## 2019-11-13 MED ORDER — IOHEXOL 350 MG/ML SOLN
INTRAVENOUS | Status: DC | PRN
  Administered 2019-11-13: 80 mL via INTRA_ARTERIAL

## 2019-11-13 MED ORDER — FENTANYL CITRATE (PF) 100 MCG/2ML IJ SOLN
INTRAMUSCULAR | Status: DC | PRN
  Administered 2019-11-13: 25 ug via INTRAVENOUS

## 2019-11-13 MED ORDER — AMLODIPINE BESYLATE 2.5 MG PO TABS: 2.5000 mg | ORAL_TABLET | Freq: Every day | ORAL | Status: DC

## 2019-11-13 SURGICAL SUPPLY — 12 items
CATH 5FR JL3.5 JR4 ANG PIG MP (CATHETERS) ×1 IMPLANT
CATH BALLN WEDGE 5F 110CM (CATHETERS) ×1 IMPLANT
DEVICE RAD COMP TR BAND LRG (VASCULAR PRODUCTS) ×1 IMPLANT
GLIDESHEATH SLEND A-KIT 6F 22G (SHEATH) ×1 IMPLANT
GUIDEWIRE INQWIRE 1.5J.035X260 (WIRE) IMPLANT
INQWIRE 1.5J .035X260CM (WIRE) ×2
KIT HEART LEFT (KITS) ×2 IMPLANT
PACK CARDIAC CATHETERIZATION (CUSTOM PROCEDURE TRAY) ×2 IMPLANT
SHEATH GLIDE SLENDER 4/5FR (SHEATH) ×1 IMPLANT
SHEATH PROBE COVER 6X72 (BAG) ×1 IMPLANT
TRANSDUCER W/STOPCOCK (MISCELLANEOUS) ×2 IMPLANT
TUBING CIL FLEX 10 FLL-RA (TUBING) ×2 IMPLANT

## 2019-11-13 NOTE — Discharge Instructions (Signed)
Radial Site Care  This sheet gives you information about how to care for yourself after your procedure. Your health care provider may also give you more specific instructions. If you have problems or questions, contact your health care provider. What can I expect after the procedure? After the procedure, it is common to have:  Bruising and tenderness at the catheter insertion area. Follow these instructions at home: Medicines  Take over-the-counter and prescription medicines only as told by your health care provider. Insertion site care  Follow instructions from your health care provider about how to take care of your insertion site. Make sure you: ? Wash your hands with soap and water before you change your bandage (dressing). If soap and water are not available, use hand sanitizer. ? Change your dressing as told by your health care provider. ? Leave stitches (sutures), skin glue, or adhesive strips in place. These skin closures may need to stay in place for 2 weeks or longer. If adhesive strip edges start to loosen and curl up, you may trim the loose edges. Do not remove adhesive strips completely unless your health care provider tells you to do that.  Check your insertion site every day for signs of infection. Check for: ? Redness, swelling, or pain. ? Fluid or blood. ? Pus or a bad smell. ? Warmth.  Do not take baths, swim, or use a hot tub until your health care provider approves.  You may shower 24-48 hours after the procedure, or as directed by your health care provider. ? Remove the dressing and gently wash the site with plain soap and water. ? Pat the area dry with a clean towel. ? Do not rub the site. That could cause bleeding.  Do not apply powder or lotion to the site. Activity   For 24 hours after the procedure, or as directed by your health care provider: ? Do not flex or bend the affected arm. ? Do not push or pull heavy objects with the affected arm. ? Do not  drive yourself home from the hospital or clinic. You may drive 24 hours after the procedure unless your health care provider tells you not to. ? Do not operate machinery or power tools.  Do not lift anything that is heavier than 10 lb (4.5 kg), or the limit that you are told, until your health care provider says that it is safe.  Ask your health care provider when it is okay to: ? Return to work or school. ? Resume usual physical activities or sports. ? Resume sexual activity. General instructions  If the catheter site starts to bleed, raise your arm and put firm pressure on the site. If the bleeding does not stop, get help right away. This is a medical emergency.  If you went home on the same day as your procedure, a responsible adult should be with you for the first 24 hours after you arrive home.  Keep all follow-up visits as told by your health care provider. This is important. Contact a health care provider if:  You have a fever.  You have redness, swelling, or yellow drainage around your insertion site. Get help right away if:  You have unusual pain at the radial site.  The catheter insertion area swells very fast.  The insertion area is bleeding, and the bleeding does not stop when you hold steady pressure on the area.  Your arm or hand becomes pale, cool, tingly, or numb. These symptoms may represent a serious problem   that is an emergency. Do not wait to see if the symptoms will go away. Get medical help right away. Call your local emergency services (911 in the [place] Do not drive yourself to the hospital. Summary  After the procedure, it is common to have bruising and tenderness at the site.  Follow instructions from your health care provider about how to take care of your radial site wound. Check the wound every day for signs of infection.  Do not lift anything that is heavier than 10 lb (4.5 kg), or the limit that you are told, until your health care provider says  that it is safe. This information is not intended to replace advice given to you by your health care provider. Make sure you discuss any questions you have with your health care provider. Document Revised: 11/29/2017 Document Reviewed: 11/29/2017 Elsevier Patient Education  2020 [employer]  

## 2019-11-13 NOTE — Progress Notes (Signed)
  Echocardiogram 2D Echocardiogram has been performed.  Bobbye Charleston 11/13/2019, 12:03 PM

## 2019-11-13 NOTE — CV Procedure (Signed)
   Right and left heart cath via right antecubital vein and right radial access.  Arterial access achieved with real-time vascular ultrasound.  Widely patent coronary arteries.  No left main disease.  Eccentric 30 to 40% proximal to mid LAD.  LAD has TIMI grade II-III flow compared to TIMI-III in other territories.  30 to 40% mid RCA.Marland Kitchen  LVEF 40 to 50%.  Right heart pressures and LVEDP are low normal.  Discussed with team and plan to allow the patient to be discharged today.  Consider a 30-day monitor to exclude paroxysmal atrial fibrillation.

## 2019-11-13 NOTE — Progress Notes (Signed)
Pt left floor for ct scan,

## 2019-11-13 NOTE — Discharge Summary (Addendum)
Discharge Summary    Patient ID: Martin Hopkins MRN: [id number]; DOB: 1951/02/04  Admit date: 11/12/2019 Discharge date: 11/13/2019  Primary Care Provider: Harlan Stains, MD  Primary Cardiologist: Fransico Him, MD  Primary Electrophysiologist:  None   Discharge Diagnoses    Principal Problem:   CAD with angina Active Problems:   Shortness of breath   OSA (obstructive sleep apnea)   Essential hypertension, benign   Chest pain    Diagnostic Studies/Procedures    Right and left heart cath 11/13/19:  Normal right heart pressures including pulmonary wedge pressure.  Normal mixed venous O2 saturation.  Widely patent coronary arteries with nonobstructive LAD and right coronary disease less than 50%.  LAD does demonstrate slight reduction in TIMI flow which raises the question of microvascular disease.  Low normal LV systolic function.  EF 50% with normal LVEDP.  RECOMMENDATIONS:   Discontinue nitrates.  Consider calcium channel blocker therapy and or ARB therapy to manage potential microvascular obstructive disease.  Aggressive risk factor modification.  Long-term monitoring to rule out transient arrhythmia as the source of symptoms. _____________   Echo 11/13/19:  1. Left ventricular ejection fraction, by visual estimation, is 60 to 65%. The left ventricle has normal function. There is mildly increased left ventricular hypertrophy.  2. Global right ventricle has normal systolic function.The right ventricular size is normal.  3. Left atrial size was normal.  4. Right atrial size was normal.  5. Mild mitral annular calcification.  6. The mitral valve is abnormal. No evidence of mitral valve regurgitation.  7. The aortic valve was not well visualized. Aortic valve regurgitation is not visualized. No evidence of aortic valve stenosis.  8. The pulmonic valve was not well visualized. Pulmonic valve regurgitation is not visualized.  9. The tricuspid valve is normal in structure. 10.  TR signal is inadequate for assessing pulmonary artery systolic pressure.   History of Present Illness     Martin Hopkins is a 69 y.o. male with a hx of OSA on CPAP, HTN, HLD, obesity, GERD, and duodenal ulcer who was seen for possible CHF exacerbation.  Martin Hopkins was last seen in clinic by Dr. Radford Pax on 03/04/19. At that time, he reported chest tightness 3-4 times per week on the right side without radiation. Last nuclear stress test negative for reversible ischemia in 2018. Given his risk factors, he underwent CT coronary which revealed normal coronaries without CAD 03/2019. He was seen in consult on 11/09/19.  He reported shortness of breath that woke him from sleep. In the ER, he had a mildly elevated BNP 183, negative D-dimer, and negative hs troponin negative x 2. His COVID test was negative. He was evaluated by Dr. Audie Box who appreciated lower extremity edema and suspected possible onset of CHF. He  recommended OTO 40 mg IV lasix and discharged on 20 mg PO lasix with close cardiology follow up. He states that his lower extemity swelling improved after lasix. However, he suffered another bout of SOB yesterday that he "couldn't walk off." He called our office who recommended increasing lasix to 60 mg x 3 days. Unfortunately, he presented back to the ER with increased DOE and orthopnea on 11/12/19. He continues to have bouts of SOB that aren't sustained. He also continues to report he same chest pain that he has had since April 2020.   BNP normal, D-dimer negative CXR negative for acute cardiopulmonary process HS troponin negative EKG remains nonischemic  Hospital Course     Consultants:  SOB, DOE Orthopnea Chest pain He underwent CT coronary recently, which revealed no obstructive disease but heavy calcium burden, which may have limited the diagnostic accuracy of the test. The patient's symptoms were concerning for unstable angina. He was admitted to cardiology service and underwent right and left  heart catheterization on 11/13/19. Angiography revealed normal heart pressures. LAD with slight reduction in TIMI flow suggesting possible microvascular disease. Otherwise, mild nonobstructive disease. Recommendations include D/C imdur and initiate 2.5 mg norvasc. Titrate as tolerated in OP setting. Continue home toprol 25 mg daily.   Patient recovered from heart catheterization in short stay. Called by nursing that he continued to have SOB. Reviewed the case with Dr. Burt Knack and obtained CT chest which ruled out PE. Will need to refer back to PCP to rule out noncardiac causes of SOB.   COVID-19 testing has been negative, including PCR testing.    Hypertension Medications as above   Hyperlipidemia Continue home regimen - zocor, fenofibrate, and vascepa. 11/13/2019: Cholesterol 247; HDL 34; LDL Cholesterol 185; Triglycerides 141; VLDL 28  Given his known disease, may need to uptitrate regimen or switch to high intensity statin. ?Lipid clinic referral.     Did the patient have an acute coronary syndrome (MI, NSTEMI, STEMI, etc) this admission?:  No                               Did the patient have a percutaneous coronary intervention (stent / angioplasty)?:  No.   _____________  Discharge Vitals Blood pressure 127/71, pulse 81, temperature 99.1 F (37.3 C), temperature source Oral, resp. rate 17, height 5\' 10"  (1.778 m), weight 113.9 kg, SpO2 97 %.  Filed Weights   11/12/19 1658  Weight: 113.9 kg    Labs & Radiologic Studies    CBC Recent Labs    11/12/19 2101 11/13/19 0548 11/13/19 0925 11/13/19 0929  WBC 6.9 7.0  --   --   HGB 15.7 15.3 15.0 14.3  HCT 47.2 44.8 44.0 42.0  MCV 94.2 92.2  --   --   PLT 192 208  --   --    Basic Metabolic Panel Recent Labs    11/12/19 0346 11/12/19 2101 11/13/19 0548 11/13/19 0925 11/13/19 0929  NA 135  --  136 136 133*  K 3.4*  --  3.9 3.5 3.5  CL 100  --  97*  --   --   CO2 21*  --  27  --   --   GLUCOSE 151*  --  125*  --   --     BUN 17  --  20  --   --   CREATININE 1.03 1.16 1.31*  --   --   CALCIUM 9.6  --  9.6  --   --    Liver Function Tests No results for input(s): AST, ALT, ALKPHOS, BILITOT, PROT, ALBUMIN in the last 72 hours. No results for input(s): LIPASE, AMYLASE in the last 72 hours. High Sensitivity Troponin:   Recent Labs  Lab 11/09/19 0730 11/09/19 0926 11/12/19 1552 11/12/19 1923  TROPONINIHS 2 7 3 4     BNP Invalid input(s): POCBNP D-Dimer Recent Labs    11/12/19 1657  DDIMER 0.31   Hemoglobin A1C No results for input(s): HGBA1C in the last 72 hours. Fasting Lipid Panel Recent Labs    11/13/19 0548  CHOL 247*  HDL 34*  LDLCALC 185*  TRIG 141  CHOLHDL 7.3  Thyroid Function Tests No results for input(s): TSH, T4TOTAL, T3FREE, THYROIDAB in the last 72 hours.  Invalid input(s): FREET3 _____________  DG Chest 2 View  Result Date: 11/12/2019 CLINICAL DATA:  Shortness of breath, recent diagnosis of CHF EXAM: CHEST - 2 VIEW COMPARISON:  Radiograph 11/09/2019 FINDINGS: No consolidation, features of edema, pneumothorax, or effusion. Pulmonary vascularity is normally distributed. The cardiomediastinal contours are unremarkable. No acute osseous or soft tissue abnormality. Cervical fusion noted. Degenerative changes are present in the imaged spine and shoulders. IMPRESSION: No acute cardiopulmonary abnormality. Electronically Signed   By: Lovena Le M.D.   On: 11/12/2019 03:54   DG Chest 2 View  Result Date: 11/09/2019 CLINICAL DATA:  Chest pain. EXAM: CHEST - 2 VIEW COMPARISON:  Chest radiograph 05/17/2016 FINDINGS: Heart size within normal limits. Aortic atherosclerosis. No airspace consolidation. No evidence of pleural effusion or pneumothorax. No acute bony abnormality. Cervical spinal fusion hardware. IMPRESSION: No evidence of acute cardiopulmonary abnormality. Electronically Signed   By: Kellie Simmering DO   On: 11/09/2019 08:07   CT ANGIO CHEST PE W OR WO CONTRAST  Result Date:  11/13/2019 CLINICAL DATA:  69 year old male with 5 days of shortness of breath and chest pressure. EXAM: CT ANGIOGRAPHY CHEST WITH CONTRAST TECHNIQUE: Multidetector CT imaging of the chest was performed using the standard protocol during bolus administration of intravenous contrast. Multiplanar CT image reconstructions and MIPs were obtained to evaluate the vascular anatomy. CONTRAST:  155mL OMNIPAQUE IOHEXOL 350 MG/ML SOLN COMPARISON:  Chest radiographs yesterday. Chest CTA 01/06/2006. Cardiac CT 03/19/2019. FINDINGS: Cardiovascular: Good contrast bolus timing in the pulmonary arterial tree. No focal filling defect identified in the pulmonary arteries to suggest acute pulmonary embolism. Calcified coronary artery atherosclerosis (series 6, image 204. Calcified aortic atherosclerosis. Little contrast in the aorta. Cardiac size is stable and within normal limits. No pericardial effusion. Mediastinum/Nodes: Negative. No mediastinal lymphadenopathy. Lungs/Pleura: Major airways are patent. Mildly lower lung volumes compared to 2007. Minor dependent atelectasis with no pleural effusion or other acute pulmonary opacity. Upper Abdomen: Chronic hepatic steatosis. Negative visible spleen, pancreas, adrenal glands, kidneys and bowel in the upper abdomen. Musculoskeletal: Lower cervical ACDF is partially visible. No acute osseous abnormality identified. Review of the MIP images confirms the above findings. IMPRESSION: 1. Negative for acute pulmonary embolus. 2. Calcified coronary artery Aortic Atherosclerosis (ICD10-I70.0). 3. Chronic hepatic steatosis. Electronically Signed   By: Genevie Ann M.D.   On: 11/13/2019 13:02   CARDIAC CATHETERIZATION  Result Date: 11/13/2019  Normal right heart pressures including pulmonary wedge pressure.  Normal mixed venous O2 saturation.  Widely patent coronary arteries with nonobstructive LAD and right coronary disease less than 50%.  LAD does demonstrate slight reduction in TIMI flow which  raises the question of microvascular disease.  Low normal LV systolic function.  EF 50% with normal LVEDP. RECOMMENDATIONS:  Discontinue nitrates.  Consider calcium channel blocker therapy and or ARB therapy to manage potential microvascular obstructive disease.  Aggressive risk factor modification.  Long-term monitoring to rule out transient arrhythmia as the source of symptoms.  ECHOCARDIOGRAM COMPLETE  Result Date: 11/13/2019   ECHOCARDIOGRAM REPORT   Patient Name:   Martin Hopkins Date of Exam: 11/13/2019 Medical Rec #:  [id number]   Height:       70.0 in Accession #:    [id number]  Weight:       251.0 lb Date of Birth:  [birth year]  BSA:          2.30 m Patient Age:  68 years    BP:           109/69 mmHg Patient Gender: M           HR:           80 bpm. Exam Location:  Inpatient Procedure: 2D Echo, Cardiac Doppler and Color Doppler Indications:    R06.02 SOB  History:        Patient has no prior history of Echocardiogram examinations.                 CHF, Signs/Symptoms:Dyspnea and Chest Pain; Risk Factors:Sleep                 Apnea, Hypertension and Dyslipidemia. Patient is post cath.  Sonographer:    Roseanna Rainbow RDCS Referring Phys: [id number] [place] [facility]  Sonographer Comments: Technically difficult study due to poor echo windows, suboptimal apical window, suboptimal subcostal window, suboptimal parasternal window and patient is morbidly obese. Image acquisition challenging due to patient body habitus. IMPRESSIONS  1. Left ventricular ejection fraction, by visual estimation, is 60 to 65%. The left ventricle has normal function. There is mildly increased left ventricular hypertrophy.  2. Global right ventricle has normal systolic function.The right ventricular size is normal.  3. Left atrial size was normal.  4. Right atrial size was normal.  5. Mild mitral annular calcification.  6. The mitral valve is abnormal. No evidence of mitral valve regurgitation.  7. The aortic valve was not well visualized.  Aortic valve regurgitation is not visualized. No evidence of aortic valve stenosis.  8. The pulmonic valve was not well visualized. Pulmonic valve regurgitation is not visualized.  9. The tricuspid valve is normal in structure. 10. TR signal is inadequate for assessing pulmonary artery systolic pressure. FINDINGS  Left Ventricle: Left ventricular ejection fraction, by visual estimation, is 60 to 65%. The left ventricle has normal function. The left ventricle has no regional wall motion abnormalities. There is mildly increased left ventricular hypertrophy. Left ventricular diastolic parameters were normal. Right Ventricle: The right ventricular size is normal. No increase in right ventricular wall thickness. Global RV systolic function is has normal systolic function. Left Atrium: Left atrial size was normal in size. Right Atrium: Right atrial size was normal in size Pericardium: There is no evidence of pericardial effusion. Mitral Valve: The mitral valve is abnormal. Mild mitral annular calcification. No evidence of mitral valve regurgitation. Tricuspid Valve: The tricuspid valve is normal in structure. Tricuspid valve regurgitation is not demonstrated. Aortic Valve: The aortic valve was not well visualized. Aortic valve regurgitation is not visualized. The aortic valve is structurally normal, with no evidence of sclerosis or stenosis. Pulmonic Valve: The pulmonic valve was not well visualized. Pulmonic valve regurgitation is not visualized. Pulmonic regurgitation is not visualized. Aorta: The aortic root is normal in size and structure. IAS/Shunts: The interatrial septum was not well visualized.  LEFT VENTRICLE PLAX 2D LVIDd:         4.90 cm       Diastology LVIDs:         3.60 cm       LV e' lateral:   8.49 cm/s LV PW:         1.30 cm       LV E/e' lateral: 7.0 LV IVS:        1.00 cm       LV e' medial:    7.07 cm/s LVOT diam:     2.00 cm  LV E/e' medial:  8.4 LV SV:         58 ml LV SV Index:   24.18 LVOT  Area:     3.14 cm  LV Volumes (MOD) LV area d, A2C:    21.60 cm LV area d, A4C:    26.00 cm LV area s, A2C:    11.40 cm LV area s, A4C:    12.70 cm LV major d, A2C:   7.17 cm LV major d, A4C:   6.89 cm LV major s, A2C:   5.99 cm LV major s, A4C:   5.34 cm LV vol d, MOD A2C: 54.7 ml LV vol d, MOD A4C: 80.9 ml LV vol s, MOD A2C: 19.1 ml LV vol s, MOD A4C: 25.4 ml LV SV MOD A2C:     35.6 ml LV SV MOD A4C:     80.9 ml LV SV MOD BP:      44.5 ml RIGHT VENTRICLE             IVC RV S prime:     12.40 cm/s  IVC diam: 2.00 cm TAPSE (M-mode): 2.4 cm LEFT ATRIUM             Index      RIGHT ATRIUM           Index LA diam:        3.20 cm 1.39 cm/m RA Area:     12.30 cm LA Vol (A2C):   9.7 ml  4.21 ml/m RA Volume:   25.60 ml  11.14 ml/m LA Vol (A4C):   21.4 ml 9.31 ml/m LA Biplane Vol: 15.6 ml 6.79 ml/m  AORTIC VALVE LVOT Vmax:   91.40 cm/s LVOT Vmean:  60.900 cm/s LVOT VTI:    0.169 m  AORTA Ao Root diam: 3.50 cm MITRAL VALVE MV Area (PHT): 2.83 cm             SHUNTS MV PHT:        77.72 msec           Systemic VTI:  0.17 m MV Decel Time: 268 msec             Systemic Diam: 2.00 cm MV E velocity: 59.10 cm/s 103 cm/s MV A velocity: 72.80 cm/s 70.3 cm/s MV E/A ratio:  0.81       1.5  Oswaldo Milian MD Electronically signed by Oswaldo Milian MD Signature Date/Time: 11/13/2019/1:55:10 PM    Final    Disposition   Pt is being discharged home today in good condition.  Follow-up Plans & Appointments    Follow-up Information    Sueanne Margarita, MD Follow up on 11/22/2019.   Specialty: Cardiology Why: 11:00 am hospital follow up Contact information: [address] [place] [address] [phone]          Discharge Instructions    Diet - low sodium heart healthy   Complete by: As directed    Discharge instructions   Complete by: As directed    No driving for 2 days. No lifting over 5 lbs for 1 week. No sexual activity for 1 week. You may return to work in 1 week. Keep  procedure site clean & dry. If you notice increased pain, swelling, bleeding or pus, call/return!  You may shower, but no soaking baths/hot tubs/pools for 1 week.   Increase activity slowly   Complete by: As directed       Discharge Medications   Allergies  as of 11/13/2019      Reactions   Penicillins Shortness Of Breath   Lipitor [atorvastatin]    Joint pain      Medication List    STOP taking these medications   isosorbide mononitrate 30 MG 24 hr tablet Commonly known as: IMDUR     TAKE these medications   amLODipine 2.5 MG tablet Commonly known as: NORVASC Take 1 tablet (2.5 mg total) by mouth daily.   aspirin EC 81 MG tablet Take 1 tablet (81 mg total) by mouth daily.   fenofibrate 160 MG tablet Take 1 tablet (160 mg total) by mouth daily.   furosemide 20 MG tablet Commonly known as: LASIX Take 1 tablet (20 mg total) by mouth daily.   icosapent Ethyl 1 g capsule Commonly known as: [name] Take 2 capsules (2 g total) by mouth 2 (two) times a day.   levothyroxine 175 MCG tablet Commonly known as: SYNTHROID Take 175 mcg by mouth daily before breakfast.   metoprolol succinate 25 MG 24 hr tablet Commonly known as: Toprol XL Take 1 tablet (25 mg total) by mouth daily.   nitroGLYCERIN 0.4 MG SL tablet Commonly known as: NITROSTAT Place 1 tablet (0.4 mg total) under the tongue every 5 (five) minutes x 3 doses as needed for chest pain.   simvastatin 40 MG tablet Commonly known as: ZOCOR Take 40 mg by mouth every evening.          Outstanding Labs/Studies   none  Duration of Discharge Encounter   Greater than 30 minutes including physician time.  Signed, Tami Lin [place], PA 11/13/2019, 1:57 PM   Patient seen, examined. Available data reviewed. Agree with findings, assessment, and plan as outlined by Doreene Adas, PA.  The patient is evaluated in the short stay area following his heart catheterization.  He is alert, oriented, in no distress.  Lungs are  clear, heart is regular rate and rhythm no murmur gallop, abdomen soft nontender, radial access site is clear, lower extremities have no edema.  I reviewed his cardiac catheterization films.  Because he is continued to complain of shortness of breath at rest, we elected to proceed with a CT pulmonary embolus protocol which showed no evidence of pulmonary embolus or interstitial lung problem.  All of his hospital studies have checked out well with normal troponin, nonobstructive coronary disease at catheterization, and unremarkable CT scan of the chest.  The patient and his wife are reassured about the findings.  He is stable for discharge.  Sherren Mocha, M.D. 11/13/2019 2:39 PM

## 2019-11-13 NOTE — Research (Signed)
[facility] Informed Consent   Subject Name: Martin Hopkins  Subject met inclusion and exclusion criteria.  The informed consent form, study requirements and expectations were reviewed with the subject and questions and concerns were addressed prior to the signing of the consent form.  The subject verbalized understanding of the trail requirements.  The subject agreed to participate in the [facility] trial and signed the informed consent.  The informed consent was obtained prior to performance of any protocol-specific procedures for the subject.  A copy of the signed informed consent was given to the subject and a copy was placed in the subject's medical record.  TERRELL, AMY 11/13/2019, 0800

## 2019-11-13 NOTE — Interval H&P Note (Signed)
Cath Lab Visit (complete for each Cath Lab visit)  Clinical Evaluation Leading to the Procedure:   ACS: Yes.    Non-ACS:    Anginal Classification: CCS IV  Anti-ischemic medical therapy: Minimal Therapy (1 class of medications)  Non-Invasive Test Results: No non-invasive testing performed  Prior CABG: No previous CABG      History and Physical Interval Note:  11/13/2019 8:30 AM  Martin Hopkins  has presented today for surgery, with the diagnosis of SOB.  The various methods of treatment have been discussed with the patient and family. After consideration of risks, benefits and other options for treatment, the patient has consented to  Procedure(s): RIGHT/LEFT HEART CATH AND CORONARY ANGIOGRAPHY (N/A) as a surgical intervention.  The patient's history has been reviewed, patient examined, no change in status, stable for surgery.  I have reviewed the patient's chart and labs.  Questions were answered to the patient's satisfaction.     Martin Hopkins

## 2019-11-13 NOTE — CV Procedure (Signed)
2D echo attempted but patient in cath lab.

## 2019-11-14 MED FILL — Verapamil HCl IV Soln 2.5 MG/ML: INTRAVENOUS | Qty: 2 | Status: AC

## 2019-11-20 DIAGNOSIS — Z0000 Encounter for general adult medical examination without abnormal findings: Secondary | ICD-10-CM | POA: Diagnosis not present

## 2019-11-20 DIAGNOSIS — E039 Hypothyroidism, unspecified: Secondary | ICD-10-CM | POA: Diagnosis not present

## 2019-11-20 DIAGNOSIS — E785 Hyperlipidemia, unspecified: Secondary | ICD-10-CM | POA: Diagnosis not present

## 2019-11-20 DIAGNOSIS — K760 Fatty (change of) liver, not elsewhere classified: Secondary | ICD-10-CM | POA: Diagnosis not present

## 2019-11-20 DIAGNOSIS — R0600 Dyspnea, unspecified: Secondary | ICD-10-CM | POA: Diagnosis not present

## 2019-11-22 ENCOUNTER — Other Ambulatory Visit: Payer: Self-pay

## 2019-11-22 ENCOUNTER — Ambulatory Visit: Payer: PPO | Admitting: Cardiology

## 2019-11-22 ENCOUNTER — Encounter: Payer: Self-pay | Admitting: Cardiology

## 2019-11-22 VITALS — BP 112/64 | HR 80 | Ht 70.0 in | Wt 246.0 lb

## 2019-11-22 DIAGNOSIS — I10 Essential (primary) hypertension: Secondary | ICD-10-CM

## 2019-11-22 DIAGNOSIS — I2510 Atherosclerotic heart disease of native coronary artery without angina pectoris: Secondary | ICD-10-CM

## 2019-11-22 DIAGNOSIS — G4733 Obstructive sleep apnea (adult) (pediatric): Secondary | ICD-10-CM

## 2019-11-22 DIAGNOSIS — R079 Chest pain, unspecified: Secondary | ICD-10-CM | POA: Diagnosis not present

## 2019-11-22 DIAGNOSIS — E669 Obesity, unspecified: Secondary | ICD-10-CM

## 2019-11-22 DIAGNOSIS — E785 Hyperlipidemia, unspecified: Secondary | ICD-10-CM

## 2019-11-22 MED ORDER — AMLODIPINE BESYLATE 2.5 MG PO TABS: 2.5000 mg | ORAL_TABLET | Freq: Every day | ORAL | 3 refills | Status: DC

## 2019-11-22 NOTE — Patient Instructions (Addendum)
Medication Instructions:  1) STOP ISOSORBIDE 2) START AMLODIPINE 2.5 mg daily  Labwork: Your provider recommends that you return for FASTING lab work in: 6 weeks (when you see Dr. Theodosia Blender assistant).    Follow-Up: Your provider recommends that you schedule a follow-up appointment in 6 WEEKS with Dr. Theodosia Blender assistant.  You have been referred to DR. HILTY in the [facility] in  7 weeks.

## 2019-11-22 NOTE — Progress Notes (Signed)
Cardiology Office Note:    Date:  11/22/2019   ID:  Martin Hopkins, DOB 1951/07/08, MRN [id number]  PCP:  Harlan Stains, MD  Cardiologist:  Fransico Him, MD    Referring MD: Harlan Stains, MD   Chief Complaint  Patient presents with  . Chest Pain  . Coronary Artery Disease  . Shortness of Breath  . Sleep Apnea  . Hypertension  . Hyperlipidemia    History of Present Illness:    Martin Hopkins is a 69 y.o. male with a hx of OSA on CPAP, HTN, obesity and recent chest tightness.  When I saw him in April on televisit he was complaining of chest pain.  He underwent coronary CTA showing a calcium score of 944 with moderate CAD in the pLAD, prox and mid LCx and mild CAD in the prox and mid RCA.  FFR showed no flow limiting lesions.    He was recently admitted to [facility] 11/09/2019 with SOB and right sided CP lasting several minutes.  He had awakened with profound SOB which his CPAP did not help him with and then developed the right sided CP lasted a few minutes.  The next night the same thing happened without the CP.  He also had had LE edema nd a 10lb weight gain over a few weeks.  He underwent right and left heart cath showing widely patent coronary arteries with nonobstructive LAD and RCA <50%.  There was slight reduction in TIMI flow in the LAD raising question of microvascular disease and EF was 50% with normal LVEDP. 2D echo showed normal LVF with EF 60-65%.  DDimer was negative and trop neg x 2.  He was started on IV lasix for mildly elevated BNP and LE edema. He was started on amlodpine 2.5mg  daily for possible microvascular angina.  Prior to discharge after cath he had recurrent SOB and chest CTA showed no PE.  He was sent home to followup with PCP to rule out noncardiac causes of SOB.   He is here today for followup.  He denies any chest pain or pressure, SOB, DOE, PND, orthopnea, LE edema, dizziness, palpitations or syncope. His medications were reviewed and are different from what he was  discharged on.  His Imdur was stopped in the hospital and he was started on Amlodipine 2.5mg  daily but he did not start the amlodipine and went back on the Imdur.  He also was discharged home on [name] and fenofibrate in addition to his statin but only started back on the statin.  He says that the fenofibrate made his joints hurt.  His Lipids in the hospital showed an LDL of 185 which was an increase from the LDL in August of 59.   Past Medical History:  Diagnosis Date  . Adenomatous polyp    Dr Cristina Gong  . Allergic rhinitis   . ED (erectile dysfunction)   . Fatty liver   . GERD (gastroesophageal reflux disease)    mild  . H/O: duodenal ulcer 1982  . Hypercholesteremia   . Hypertension   . Hypogonadism male   . Hypothyroidism   . Lumbar disc disease    L5--Dr Cay Schillings  . Obesity (BMI 30-39.9)   . OSA (obstructive sleep apnea)    AHI 40/hr now on CPAP 9cm H2O    Past Surgical History:  Procedure Laterality Date  . LUMBAR LAMINECTOMY  09/28/2011   Procedure: MICRODISCECTOMY LUMBAR LAMINECTOMY;  Surgeon: Tobi Bastos;  Location: [facility];  Service: Orthopedics;  Laterality:  Left;  Hemi-Laminectomy, Microdiscectomy  Lumbar 5 - Sacral 1 Left    . NASAL FRACTURE SURGERY  10/1971  . OTHER SURGICAL HISTORY  1972   nasal surgery   . OTHER SURGICAL HISTORY  2011   right trigger finger surgey   . right hand surgery     remove knots  . RIGHT/LEFT HEART CATH AND CORONARY ANGIOGRAPHY N/A 11/13/2019   Procedure: RIGHT/LEFT HEART CATH AND CORONARY ANGIOGRAPHY;  Surgeon: Belva Crome, MD;  Location: [facility];  Service: Cardiovascular;  Laterality: N/A;  . VASECTOMY      Current Medications: Current Meds  Medication Sig  . aspirin EC 81 MG tablet Take 1 tablet (81 mg total) by mouth daily.  . furosemide (LASIX) 20 MG tablet Take 1 tablet (20 mg total) by mouth daily.  . isosorbide dinitrate (ISORDIL) 30 MG tablet Take 15 mg by mouth daily.  Marland Kitchen levothyroxine (SYNTHROID) 175 MCG  tablet Take 175 mcg by mouth daily before breakfast.  . nitroGLYCERIN (NITROSTAT) 0.4 MG SL tablet Place 1 tablet (0.4 mg total) under the tongue every 5 (five) minutes x 3 doses as needed for chest pain.  . simvastatin (ZOCOR) 40 MG tablet Take 40 mg by mouth every evening.  . [DISCONTINUED] amLODipine (NORVASC) 2.5 MG tablet Take 1 tablet (2.5 mg total) by mouth daily.  . [DISCONTINUED] fenofibrate 160 MG tablet Take 1 tablet (160 mg total) by mouth daily.  . [DISCONTINUED] Icosapent Ethyl 1 g CAPS Take 2 capsules (2 g total) by mouth 2 (two) times a day.  . [DISCONTINUED] metoprolol succinate (TOPROL XL) 25 MG 24 hr tablet Take 1 tablet (25 mg total) by mouth daily.     Allergies:   Penicillins and Lipitor [atorvastatin]   Social History   Socioeconomic History  . Marital status: Married    Spouse name: Not on file  . Number of children: Not on file  . Years of education: Not on file  . Highest education level: Not on file  Occupational History  . Not on file  Tobacco Use  . Smoking status: Former Smoker    Quit date: 11/07/1990    Years since quitting: 29.0  . Smokeless tobacco: Never Used  Substance and Sexual Activity  . Alcohol use: Yes    Comment: rare  . Drug use: No  . Sexual activity: Not on file  Other Topics Concern  . Not on file  Social History Narrative  . Not on file   Social Determinants of Health   Financial Resource Strain:   . Difficulty of Paying Living Expenses: Not on file  Food Insecurity:   . Worried About Charity fundraiser in the Last Year: Not on file  . Ran Out of Food in the Last Year: Not on file  Transportation Needs:   . Lack of Transportation (Medical): Not on file  . Lack of Transportation (Non-Medical): Not on file  Physical Activity:   . Days of Exercise per Week: Not on file  . Minutes of Exercise per Session: Not on file  Stress:   . Feeling of Stress : Not on file  Social Connections:   . Frequency of Communication with  Friends and Family: Not on file  . Frequency of Social Gatherings with Friends and Family: Not on file  . Attends Religious Services: Not on file  . Active Member of Clubs or Organizations: Not on file  . Attends Archivist Meetings: Not on file  . Marital  Status: Not on file     Family History: The patient's family history includes CAD in his father and paternal grandfather; Hypertension in his brother and mother; Peripheral vascular disease in his brother and father.  ROS:   Please see the history of present illness.    ROS  All other systems reviewed and negative.   EKGs/Labs/Other Studies Reviewed:    The following studies were reviewed today: Cardiac cath, 2D echo, labs  EKG:  EKG is not ordered today.   Recent Labs: 09/09/2019: ALT 57 11/12/2019: B Natriuretic Peptide 18.4 11/13/2019: BUN 20; Creatinine, Ser 1.31; Hemoglobin 14.3; Platelets 208; Potassium 3.5; Sodium 133   Recent Lipid Panel    Component Value Date/Time   CHOL 247 (H) 11/13/2019 0548   CHOL 138 09/09/2019 0809   TRIG 141 11/13/2019 0548   HDL 34 (L) 11/13/2019 0548   HDL 31 (L) 09/09/2019 0809   CHOLHDL 7.3 11/13/2019 0548   VLDL 28 11/13/2019 0548   LDLCALC 185 (H) 11/13/2019 0548   LDLCALC 75 09/09/2019 0809    Physical Exam:    VS:  BP 112/64   Pulse 80   Ht 5\' 10"  (1.778 m)   Wt 246 lb (111.6 kg)   SpO2 99%   BMI 35.30 kg/m     Wt Readings from Last 3 Encounters:  11/22/19 246 lb (111.6 kg)  11/12/19 251 lb (113.9 kg)  03/04/19 247 lb (112 kg)     GEN:  Well nourished, well developed in no acute distress HEENT: Normal NECK: No JVD; No carotid bruits LYMPHATICS: No lymphadenopathy CARDIAC: RRR, no murmurs, rubs, gallops RESPIRATORY:  Clear to auscultation without rales, wheezing or rhonchi  ABDOMEN: Soft, non-tender, non-distended MUSCULOSKELETAL:  No edema; No deformity  SKIN: Warm and dry NEUROLOGIC:  Alert and oriented x 3 PSYCHIATRIC:  Normal affect   ASSESSMENT:     1. OSA (obstructive sleep apnea)   2. Essential hypertension, benign   3. Obesity (BMI 30-39.9)   4. Chest pain, unspecified type   5. Coronary artery disease involving native coronary artery of native heart without angina pectoris   6. Hyperlipidemia, unspecified hyperlipidemia type    PLAN:    In order of problems listed above:  1. OSA -  The patient is tolerating PAP therapy well without any problems. The patient has been using and benefiting from PAP use and will continue to benefit from therapy.   2.  HTN -BP controlled -his BP has been running on the lower side of normal and he has not been taking his Toprol -will keep off of the Toprol -restart amlodipine 2.5mg  daily for possible microvascular angina - this is a small does and should not drop his BP  3.  Obesity -I have encouraged him to get into a routine exercise program and cut back on carbs and portions.   4.  Noncardiac CP -? Whether this is microvascular angina or GI -? GERD with esophageal spasm and aspiration since it occurs a lot at night and he has gained weight mainly in his belly which increases risk of GERD. -I explained that GERD does not always present with indigestion sx or burning and can present as pain that mimics a heart attack -followup with PCP  5.  ASCAD -nonobstructive by recent cath in the LAD and RCA < 50% -continue ASA and statin -restart amlodipine for possible microvascular angina and stop Imdur  6.  HLD -LDL goal < 70 -LDL was 185 on 11/13/2019 -he does not  want to continue fenofibrate and [name] due to side effects -he does not want to change to a different statin due to hx of side effects -I think he would be a good candidate to consider the PCSK9i or Bempedoic acid  -I will refer to Dr. Debara Pickett with lipid clinic for his recommendations    Medication Adjustments/Labs and Tests Ordered: Current medicines are reviewed at length with the patient today.  Concerns regarding medicines are  outlined above.  No orders of the defined types were placed in this encounter.  No orders of the defined types were placed in this encounter.   Signed, Fransico Him, MD  11/22/2019 11:21 AM    [place]

## 2019-11-25 MED ORDER — FUROSEMIDE 20 MG PO TABS: 20.0000 mg | ORAL_TABLET | Freq: Every day | ORAL | 11 refills | Status: AC

## 2019-11-26 DIAGNOSIS — R1033 Periumbilical pain: Secondary | ICD-10-CM | POA: Diagnosis not present

## 2019-11-26 DIAGNOSIS — R0600 Dyspnea, unspecified: Secondary | ICD-10-CM | POA: Diagnosis not present

## 2019-11-27 ENCOUNTER — Ambulatory Visit (INDEPENDENT_AMBULATORY_CARE_PROVIDER_SITE_OTHER): Payer: PPO | Admitting: Gastroenterology

## 2019-11-27 ENCOUNTER — Encounter: Payer: Self-pay | Admitting: Gastroenterology

## 2019-11-27 ENCOUNTER — Other Ambulatory Visit (INDEPENDENT_AMBULATORY_CARE_PROVIDER_SITE_OTHER): Payer: PPO

## 2019-11-27 ENCOUNTER — Other Ambulatory Visit: Payer: Self-pay

## 2019-11-27 VITALS — BP 120/70 | HR 88 | Temp 98.2°F | Ht 70.0 in | Wt 239.5 lb

## 2019-11-27 DIAGNOSIS — K760 Fatty (change of) liver, not elsewhere classified: Secondary | ICD-10-CM

## 2019-11-27 DIAGNOSIS — R6881 Early satiety: Secondary | ICD-10-CM | POA: Diagnosis not present

## 2019-11-27 DIAGNOSIS — R634 Abnormal weight loss: Secondary | ICD-10-CM

## 2019-11-27 DIAGNOSIS — R140 Abdominal distension (gaseous): Secondary | ICD-10-CM | POA: Diagnosis not present

## 2019-11-27 DIAGNOSIS — R194 Change in bowel habit: Secondary | ICD-10-CM | POA: Diagnosis not present

## 2019-11-27 DIAGNOSIS — Z01818 Encounter for other preprocedural examination: Secondary | ICD-10-CM | POA: Diagnosis not present

## 2019-11-27 LAB — IBC + FERRITIN
Ferritin: 515.7 ng/mL — ABNORMAL HIGH (ref 22.0–322.0)
Iron: 103 ug/dL (ref 42–165)
Saturation Ratios: 26.5 % (ref 20.0–50.0)
Transferrin: 278 mg/dL (ref 212.0–360.0)

## 2019-11-27 MED ORDER — POLYETHYLENE GLYCOL 3350 17 G PO PACK: 17.0000 g | PACK | Freq: Every day | ORAL | 0 refills | Status: DC

## 2019-11-27 MED ORDER — SUTAB 1479-225-188 MG PO TABS: 1.0000 | ORAL_TABLET | Freq: Once | ORAL | 0 refills | Status: AC

## 2019-11-27 NOTE — Patient Instructions (Addendum)
If you are age 67 or older, your body mass index should be between 23-30. Your Body mass index is 34.36 kg/m. If this is out of the aforementioned range listed, please consider follow up with your Primary Care Provider.  If you are age 92 or younger, your body mass index should be between 19-25. Your Body mass index is 34.36 kg/m. If this is out of the aformentioned range listed, please consider follow up with your Primary Care Provider.   You have been scheduled for an endoscopy and colonoscopy. Please follow the written instructions given to you at your visit today. Please pick up your prep supplies at the pharmacy within the next 1-3 days. If you use inhalers (even only as needed), please bring them with you on the day of your procedure.  We are giving you a Low Fod-Map diet to follow today.  Take Miralax, over the counter, daily.    We will request your colonoscopy records from [facility].    Please go to the lab in the basement of our building to have lab work done as you leave today. Hit "B" for basement when you get on the elevator.  When the doors open the lab is on your left.  We will call you with the results. Thank you.  Thank you for entrusting me with your care and for choosing [facility], Dr. Ukiah Cellar

## 2019-11-27 NOTE — Progress Notes (Signed)
HPI :  69 y/o male with a history of colon polyps, fatty liver, remote history of duodenal ulcers, referred here by Harlan Stains MD for multiple digestive complaints.  He states around the beginning of December he's developed some significant bloating / distension of his abdomen intermittently. This has worsened over time, particularly in the past few weeks. He has a very hard time eating, feels full quite easily so his appetite and total intake has been much less. He's lost 22 lbs in the past month or so due to symptoms. He feels the distension / bloating in his mid to upper abdomen mostly. He has some nausea but has not vomited. He does have belching frequently from the air buildup. He has been eating much smaller portions to reduce his symptoms but still feels poorly. His bowels have been regular during this time as well. Sometimes constipated, sometimes looser stools. No blood in his stools. Having a bowel movement can make him feel somewhat better. He does endorse a history of colon polyps years ago but thinks his last colonoscopy was normal. He feels his last exam was around 2016 but no records available. He does not think he has ever had a prior colonoscopy. No FH of gastric / esophageal / colon cancer. No NSAID use.  He normally has a BM every day but during this time his frequency has been less.  Of note, due to the distention of his abdomen this has led to some symptoms of shortness of breath.  He has had a significant cardiac work-up recently for this with an echocardiogram which showed a normal ejection fraction, negative cardiac catheterization, and a negative CT angiogram of the chest.  Of note he was given a trial of Prilosec 40 mg a day within the past week, does not think it helped at all yet.  He does not have much reflux symptoms associate with this.  He otherwise has had a chronic mild ALT elevation and reported history of fatty liver disease.  He really does not drink much of any  alcohol.  He denies any family history of liver disease.  Looking through her chart I do not see prior labs to evaluate for chronic liver disease.  He was noted to have hepatic steatosis on the CT angio of the chest that was done recently.  No evidence of cirrhosis.  He had an ultrasound in 2009 which showed hepatic steatosis.   Echo 11/13/2019 - EF 60-65%  CT angio chest 11/13/19 - negative for PE, hepatic steatosis    Past Medical History:  Diagnosis Date  . Adenomatous polyp    Dr Cristina Gong  . Allergic rhinitis   . ED (erectile dysfunction)   . Fatty liver   . GERD (gastroesophageal reflux disease)    mild  . H/O: duodenal ulcer 1982  . Hypercholesteremia   . Hypertension   . Hypogonadism male   . Hypothyroidism   . Lumbar disc disease    L5--Dr Cay Schillings  . Obesity (BMI 30-39.9)   . OSA (obstructive sleep apnea)    AHI 40/hr now on CPAP 9cm H2O     Past Surgical History:  Procedure Laterality Date  . colonscopy  10/2015   [facility]  . LUMBAR LAMINECTOMY  09/28/2011   Procedure: MICRODISCECTOMY LUMBAR LAMINECTOMY;  Surgeon: Tobi Bastos;  Location: [facility];  Service: Orthopedics;  Laterality: Left;  Hemi-Laminectomy, Microdiscectomy  Lumbar 5 - Sacral 1 Left    . NASAL FRACTURE SURGERY  10/1971  .  NECK SURGERY  2014  . OTHER SURGICAL HISTORY  1972   nasal surgery   . OTHER SURGICAL HISTORY  2011   right trigger finger surgey   . right hand surgery     remove knots  . RIGHT/LEFT HEART CATH AND CORONARY ANGIOGRAPHY N/A 11/13/2019   Procedure: RIGHT/LEFT HEART CATH AND CORONARY ANGIOGRAPHY;  Surgeon: Belva Crome, MD;  Location: [facility];  Service: Cardiovascular;  Laterality: N/A;  . VASECTOMY     Family History  Problem Relation Age of Onset  . Hypertension Mother   . CAD Father   . Peripheral vascular disease Father   . Peripheral vascular disease Brother   . Hypertension Brother   . CAD Paternal Grandfather   . Colon cancer Neg Hx   . Liver cancer  Neg Hx    Social History   Tobacco Use  . Smoking status: Former Smoker    Quit date: 11/07/1990    Years since quitting: 29.0  . Smokeless tobacco: Never Used  Substance Use Topics  . Alcohol use: Yes    Comment: rare  . Drug use: No   Current Outpatient Medications  Medication Sig Dispense Refill  . amLODipine (NORVASC) 2.5 MG tablet Take 1 tablet (2.5 mg total) by mouth daily. 90 tablet 3  . aspirin EC 81 MG tablet Take 1 tablet (81 mg total) by mouth daily. 90 tablet 3  . furosemide (LASIX) 20 MG tablet Take 1 tablet (20 mg total) by mouth daily. 30 tablet 11  . levothyroxine (SYNTHROID) 175 MCG tablet Take 175 mcg by mouth daily before breakfast.    . nitroGLYCERIN (NITROSTAT) 0.4 MG SL tablet Place 1 tablet (0.4 mg total) under the tongue every 5 (five) minutes x 3 doses as needed for chest pain. 25 tablet 3  . simvastatin (ZOCOR) 40 MG tablet Take 40 mg by mouth every evening.  6  . omeprazole (PRILOSEC) 40 MG capsule Take 40 mg by mouth every morning.     No current facility-administered medications for this visit.   Allergies  Allergen Reactions  . Penicillins Shortness Of Breath  . Lipitor [Atorvastatin]     Joint pain      Review of Systems: All systems reviewed and negative except where noted in HPI.    DG Chest 2 View  Result Date: 11/12/2019 CLINICAL DATA:  Shortness of breath, recent diagnosis of CHF EXAM: CHEST - 2 VIEW COMPARISON:  Radiograph 11/09/2019 FINDINGS: No consolidation, features of edema, pneumothorax, or effusion. Pulmonary vascularity is normally distributed. The cardiomediastinal contours are unremarkable. No acute osseous or soft tissue abnormality. Cervical fusion noted. Degenerative changes are present in the imaged spine and shoulders. IMPRESSION: No acute cardiopulmonary abnormality. Electronically Signed   By: Lovena Le M.D.   On: 11/12/2019 03:54   DG Chest 2 View  Result Date: 11/09/2019 CLINICAL DATA:  Chest pain. EXAM: CHEST - 2  VIEW COMPARISON:  Chest radiograph 05/17/2016 FINDINGS: Heart size within normal limits. Aortic atherosclerosis. No airspace consolidation. No evidence of pleural effusion or pneumothorax. No acute bony abnormality. Cervical spinal fusion hardware. IMPRESSION: No evidence of acute cardiopulmonary abnormality. Electronically Signed   By: Kellie Simmering DO   On: 11/09/2019 08:07   CT ANGIO CHEST PE W OR WO CONTRAST  Result Date: 11/13/2019 CLINICAL DATA:  69 year old male with 5 days of shortness of breath and chest pressure. EXAM: CT ANGIOGRAPHY CHEST WITH CONTRAST TECHNIQUE: Multidetector CT imaging of the chest was performed using the standard  protocol during bolus administration of intravenous contrast. Multiplanar CT image reconstructions and MIPs were obtained to evaluate the vascular anatomy. CONTRAST:  196mL OMNIPAQUE IOHEXOL 350 MG/ML SOLN COMPARISON:  Chest radiographs yesterday. Chest CTA 01/06/2006. Cardiac CT 03/19/2019. FINDINGS: Cardiovascular: Good contrast bolus timing in the pulmonary arterial tree. No focal filling defect identified in the pulmonary arteries to suggest acute pulmonary embolism. Calcified coronary artery atherosclerosis (series 6, image 204. Calcified aortic atherosclerosis. Little contrast in the aorta. Cardiac size is stable and within normal limits. No pericardial effusion. Mediastinum/Nodes: Negative. No mediastinal lymphadenopathy. Lungs/Pleura: Major airways are patent. Mildly lower lung volumes compared to 2007. Minor dependent atelectasis with no pleural effusion or other acute pulmonary opacity. Upper Abdomen: Chronic hepatic steatosis. Negative visible spleen, pancreas, adrenal glands, kidneys and bowel in the upper abdomen. Musculoskeletal: Lower cervical ACDF is partially visible. No acute osseous abnormality identified. Review of the MIP images confirms the above findings. IMPRESSION: 1. Negative for acute pulmonary embolus. 2. Calcified coronary artery Aortic  Atherosclerosis (ICD10-I70.0). 3. Chronic hepatic steatosis. Electronically Signed   By: Genevie Ann M.D.   On: 11/13/2019 13:02   CARDIAC CATHETERIZATION  Result Date: 11/13/2019  Normal right heart pressures including pulmonary wedge pressure.  Normal mixed venous O2 saturation.  Widely patent coronary arteries with nonobstructive LAD and right coronary disease less than 50%.  LAD does demonstrate slight reduction in TIMI flow which raises the question of microvascular disease.  Low normal LV systolic function.  EF 50% with normal LVEDP. RECOMMENDATIONS:  Discontinue nitrates.  Consider calcium channel blocker therapy and or ARB therapy to manage potential microvascular obstructive disease.  Aggressive risk factor modification.  Long-term monitoring to rule out transient arrhythmia as the source of symptoms.  ECHOCARDIOGRAM COMPLETE  Result Date: 11/13/2019   ECHOCARDIOGRAM REPORT   Patient Name:   Martin Hopkins Date of Exam: 11/13/2019 Medical Rec #:  [id number]   Height:       70.0 in Accession #:    [id number]  Weight:       251.0 lb Date of Birth:  1951/06/16  BSA:          2.30 m Patient Age:    89 years    BP:           109/69 mmHg Patient Gender: M           HR:           80 bpm. Exam Location:  Inpatient Procedure: 2D Echo, Cardiac Doppler and Color Doppler Indications:    R06.02 SOB  History:        Patient has no prior history of Echocardiogram examinations.                 CHF, Signs/Symptoms:Dyspnea and Chest Pain; Risk Factors:Sleep                 Apnea, Hypertension and Dyslipidemia. Patient is post cath.  Sonographer:    Roseanna Rainbow RDCS Referring Phys: [id number] [facility]  Sonographer Comments: Technically difficult study due to poor echo windows, suboptimal apical window, suboptimal subcostal window, suboptimal parasternal window and patient is morbidly obese. Image acquisition challenging due to patient body habitus. IMPRESSIONS  1. Left ventricular ejection fraction, by visual  estimation, is 60 to 65%. The left ventricle has normal function. There is mildly increased left ventricular hypertrophy.  2. Global right ventricle has normal systolic function.The right ventricular size is normal.  3. Left atrial size was normal.  4. Right  atrial size was normal.  5. Mild mitral annular calcification.  6. The mitral valve is abnormal. No evidence of mitral valve regurgitation.  7. The aortic valve was not well visualized. Aortic valve regurgitation is not visualized. No evidence of aortic valve stenosis.  8. The pulmonic valve was not well visualized. Pulmonic valve regurgitation is not visualized.  9. The tricuspid valve is normal in structure. 10. TR signal is inadequate for assessing pulmonary artery systolic pressure. FINDINGS  Left Ventricle: Left ventricular ejection fraction, by visual estimation, is 60 to 65%. The left ventricle has normal function. The left ventricle has no regional wall motion abnormalities. There is mildly increased left ventricular hypertrophy. Left ventricular diastolic parameters were normal. Right Ventricle: The right ventricular size is normal. No increase in right ventricular wall thickness. Global RV systolic function is has normal systolic function. Left Atrium: Left atrial size was normal in size. Right Atrium: Right atrial size was normal in size Pericardium: There is no evidence of pericardial effusion. Mitral Valve: The mitral valve is abnormal. Mild mitral annular calcification. No evidence of mitral valve regurgitation. Tricuspid Valve: The tricuspid valve is normal in structure. Tricuspid valve regurgitation is not demonstrated. Aortic Valve: The aortic valve was not well visualized. Aortic valve regurgitation is not visualized. The aortic valve is structurally normal, with no evidence of sclerosis or stenosis. Pulmonic Valve: The pulmonic valve was not well visualized. Pulmonic valve regurgitation is not visualized. Pulmonic regurgitation is not  visualized. Aorta: The aortic root is normal in size and structure. IAS/Shunts: The interatrial septum was not well visualized.  LEFT VENTRICLE PLAX 2D LVIDd:         4.90 cm       Diastology LVIDs:         3.60 cm       LV e' lateral:   8.49 cm/s LV PW:         1.30 cm       LV E/e' lateral: 7.0 LV IVS:        1.00 cm       LV e' medial:    7.07 cm/s LVOT diam:     2.00 cm       LV E/e' medial:  8.4 LV SV:         58 ml LV SV Index:   24.18 LVOT Area:     3.14 cm  LV Volumes (MOD) LV area d, A2C:    21.60 cm LV area d, A4C:    26.00 cm LV area s, A2C:    11.40 cm LV area s, A4C:    12.70 cm LV major d, A2C:   7.17 cm LV major d, A4C:   6.89 cm LV major s, A2C:   5.99 cm LV major s, A4C:   5.34 cm LV vol d, MOD A2C: 54.7 ml LV vol d, MOD A4C: 80.9 ml LV vol s, MOD A2C: 19.1 ml LV vol s, MOD A4C: 25.4 ml LV SV MOD A2C:     35.6 ml LV SV MOD A4C:     80.9 ml LV SV MOD BP:      44.5 ml RIGHT VENTRICLE             IVC RV S prime:     12.40 cm/s  IVC diam: 2.00 cm TAPSE (M-mode): 2.4 cm LEFT ATRIUM             Index      RIGHT ATRIUM  Index LA diam:        3.20 cm 1.39 cm/m RA Area:     12.30 cm LA Vol (A2C):   9.7 ml  4.21 ml/m RA Volume:   25.60 ml  11.14 ml/m LA Vol (A4C):   21.4 ml 9.31 ml/m LA Biplane Vol: 15.6 ml 6.79 ml/m  AORTIC VALVE LVOT Vmax:   91.40 cm/s LVOT Vmean:  60.900 cm/s LVOT VTI:    0.169 m  AORTA Ao Root diam: 3.50 cm MITRAL VALVE MV Area (PHT): 2.83 cm             SHUNTS MV PHT:        77.72 msec           Systemic VTI:  0.17 m MV Decel Time: 268 msec             Systemic Diam: 2.00 cm MV E velocity: 59.10 cm/s 103 cm/s MV A velocity: 72.80 cm/s 70.3 cm/s MV E/A ratio:  0.81       1.5  Oswaldo Milian MD Electronically signed by Oswaldo Milian MD Signature Date/Time: 11/13/2019/1:55:10 PM    Final    Lab Results  Component Value Date   WBC 7.0 11/13/2019   HGB 14.3 11/13/2019   HCT 42.0 11/13/2019   MCV 92.2 11/13/2019   PLT 208 11/13/2019    Lab Results    Component Value Date   CREATININE 1.31 (H) 11/13/2019   BUN 20 11/13/2019   NA 133 (L) 11/13/2019   K 3.5 11/13/2019   CL 97 (L) 11/13/2019   CO2 27 11/13/2019    Lab Results  Component Value Date   ALT 57 (H) 09/09/2019   AST 47 (H) 09/09/2019   ALKPHOS 75 09/09/2019   BILITOT 0.5 09/09/2019      Physical Exam: BP 120/70   Pulse 88   Temp 98.2 F (36.8 C)   Ht 5\' 10"  (1.778 m)   Wt 239 lb 8 oz (108.6 kg)   BMI 34.36 kg/m  Constitutional: Pleasant,well-developed, male in no acute distress. HEENT: Normocephalic and atraumatic. Conjunctivae are normal. No scleral icterus. Neck supple.  Cardiovascular: Normal rate, regular rhythm.  Pulmonary/chest: Effort normal and breath sounds normal. No wheezing, rales or rhonchi. Abdominal: Soft, protuberant, nontender.  There are no masses palpable.  Extremities: no edema Lymphadenopathy: No cervical adenopathy noted. Neurological: Alert and oriented to person place and time. Skin: Skin is warm and dry. No rashes noted. Psychiatric: Normal mood and affect. Behavior is normal.   ASSESSMENT AND PLAN: 69 year old male here for new patient assessment of the following:  Bloating / early satiety / weight loss / altered bowel habits - significant weight loss in recent weeks due to early satiety and poor appetite.  He has abdominal distention and discomfort after eating.  He is also had some altered bowel habits as well during this time.  I discussed differential diagnosis with him.  I am recommending an EGD to ensure no evidence of gastric outlet obstruction, rule out upper tract pathology.  At the same time in light of his altered bowel habits associated with this and that his last colonoscopy was about 5 years ago, recommend colonoscopy as well.  I discussed risks and benefits of each of these exams with him and he wanted to proceed.  Sutab prep provided.  In the interim recommend trial of low FODMAP diet to reduce intestinal gas, and  recommend he take MiraLAX daily to keep stools regular and see if that  will also help reduce his bloating.  He will continue empiric trial of PPI and avoid NSAIDs.  If no clear cause for symptoms on this exam would consider cross-sectional imaging with CT, and or gastric emptying study  Fatty liver - longstanding fatty liver, no significant alcohol use.  Mild ALT elevation which appears chronic.  I suspect the ALT elevation is likely due to fatty liver disease however he has not had a prior serologic work-up to rule out viral hepatitis, hemochromatosis, autoimmune hepatitis etc.  Recommend some basic labs to assess for these etiologies.  If negative then we assume the fatty liver is driving this process.  Ultimately weight loss and normalization of the BMI will be helpful here.  Will need ongoing monitoring of his LFTs for this issue.  No evidence of cirrhosis at this time, platelets normal.  I spent 50 minutes of time, including in depth chart review, independent review of results as outlined above, communicating results with the patient directly, face-to-face time with the patient, coordinating care, ordering studies and medications as appropriate, and documenting this encounter.   [place] Cellar, MD [facility]  CC: Harlan Stains, MD

## 2019-11-28 ENCOUNTER — Ambulatory Visit (INDEPENDENT_AMBULATORY_CARE_PROVIDER_SITE_OTHER): Payer: PPO

## 2019-11-28 ENCOUNTER — Encounter: Payer: Self-pay | Admitting: Gastroenterology

## 2019-11-28 ENCOUNTER — Telehealth: Payer: Self-pay | Admitting: Gastroenterology

## 2019-11-28 DIAGNOSIS — Z1159 Encounter for screening for other viral diseases: Secondary | ICD-10-CM

## 2019-11-28 NOTE — Telephone Encounter (Signed)
Called patient and let him know part of his labs are still in progess

## 2019-11-29 LAB — SARS CORONAVIRUS 2 (TAT 6-24 HRS): SARS Coronavirus 2: NEGATIVE

## 2019-12-02 ENCOUNTER — Other Ambulatory Visit: Payer: Self-pay

## 2019-12-02 ENCOUNTER — Encounter: Payer: Self-pay | Admitting: Gastroenterology

## 2019-12-02 ENCOUNTER — Ambulatory Visit (AMBULATORY_SURGERY_CENTER): Payer: PPO | Admitting: Gastroenterology

## 2019-12-02 VITALS — BP 116/57 | HR 76 | Temp 97.1°F | Resp 17 | Ht 70.0 in | Wt 239.0 lb

## 2019-12-02 DIAGNOSIS — R194 Change in bowel habit: Secondary | ICD-10-CM

## 2019-12-02 DIAGNOSIS — K621 Rectal polyp: Secondary | ICD-10-CM | POA: Diagnosis not present

## 2019-12-02 DIAGNOSIS — K648 Other hemorrhoids: Secondary | ICD-10-CM

## 2019-12-02 DIAGNOSIS — D129 Benign neoplasm of anus and anal canal: Secondary | ICD-10-CM | POA: Diagnosis not present

## 2019-12-02 DIAGNOSIS — Z20822 Contact with and (suspected) exposure to covid-19: Secondary | ICD-10-CM | POA: Diagnosis not present

## 2019-12-02 DIAGNOSIS — R140 Abdominal distension (gaseous): Secondary | ICD-10-CM

## 2019-12-02 DIAGNOSIS — R6881 Early satiety: Secondary | ICD-10-CM

## 2019-12-02 DIAGNOSIS — K2990 Gastroduodenitis, unspecified, without bleeding: Secondary | ICD-10-CM | POA: Diagnosis not present

## 2019-12-02 DIAGNOSIS — Z20828 Contact with and (suspected) exposure to other viral communicable diseases: Secondary | ICD-10-CM | POA: Diagnosis not present

## 2019-12-02 DIAGNOSIS — K2970 Gastritis, unspecified, without bleeding: Secondary | ICD-10-CM

## 2019-12-02 DIAGNOSIS — K5730 Diverticulosis of large intestine without perforation or abscess without bleeding: Secondary | ICD-10-CM

## 2019-12-02 DIAGNOSIS — K2980 Duodenitis without bleeding: Secondary | ICD-10-CM | POA: Diagnosis not present

## 2019-12-02 DIAGNOSIS — K2950 Unspecified chronic gastritis without bleeding: Secondary | ICD-10-CM | POA: Diagnosis not present

## 2019-12-02 DIAGNOSIS — D128 Benign neoplasm of rectum: Secondary | ICD-10-CM

## 2019-12-02 DIAGNOSIS — R634 Abnormal weight loss: Secondary | ICD-10-CM

## 2019-12-02 DIAGNOSIS — Z03818 Encounter for observation for suspected exposure to other biological agents ruled out: Secondary | ICD-10-CM | POA: Diagnosis not present

## 2019-12-02 LAB — IGG: IgG (Immunoglobin G), Serum: 1266 mg/dL (ref 600–1540)

## 2019-12-02 LAB — HEPATITIS C ANTIBODY
Hepatitis C Ab: NONREACTIVE
SIGNAL TO CUT-OFF: 0.01 (ref <1.00)

## 2019-12-02 LAB — HEPATITIS B SURFACE ANTIBODY,QUALITATIVE: Hep B S Ab: NONREACTIVE

## 2019-12-02 LAB — ANA: Anti Nuclear Antibody (ANA): NEGATIVE

## 2019-12-02 LAB — HEPATITIS B SURFACE ANTIGEN: Hepatitis B Surface Ag: NONREACTIVE

## 2019-12-02 LAB — ANTI-SMOOTH MUSCLE ANTIBODY, IGG: Actin (Smooth Muscle) Antibody (IGG): <20 U (ref <20)

## 2019-12-02 LAB — HEPATITIS A ANTIBODY, TOTAL: Hepatitis A AB,Total: NONREACTIVE

## 2019-12-02 MED ORDER — SODIUM CHLORIDE 0.9 % IV SOLN: 500.0000 mL | Freq: Once | INTRAVENOUS | Status: DC

## 2019-12-02 NOTE — Op Note (Signed)
[facility] Patient Name: Martin Hopkins Procedure Date: 12/02/2019 9:33 AM MRN: [id number] Endoscopist: Remo Lipps P. Havery [name] , MD Age: 69 Referring MD:  Date of Birth: [birth year] Gender: Male Account #: [id number] Procedure:                Colonoscopy Indications:              Change in bowel habits, Weight loss, abdominal                            bloating / distension Medicines:                Monitored Anesthesia Care Procedure:                Pre-Anesthesia Assessment:                           - Prior to the procedure, a History and Physical                            was performed, and patient medications and                            allergies were reviewed. The patient's tolerance of                            previous anesthesia was also reviewed. The risks                            and benefits of the procedure and the sedation                            options and risks were discussed with the patient.                            All questions were answered, and informed consent                            was obtained. Prior Anticoagulants: The patient has                            taken no previous anticoagulant or antiplatelet                            agents. ASA Grade Assessment: III - A patient with                            severe systemic disease. After reviewing the risks                            and benefits, the patient was deemed in                            satisfactory condition to undergo the procedure.  After obtaining informed consent, the colonoscope                            was passed under direct vision. Throughout the                            procedure, the patient's blood pressure, pulse, and                            oxygen saturations were monitored continuously. The                            Colonoscope was introduced through the anus and                            advanced to the the terminal ileum,  with                            identification of the appendiceal orifice and IC                            valve. The colonoscopy was performed without                            difficulty. The patient tolerated the procedure                            well. The quality of the bowel preparation was                            good. The terminal ileum, ileocecal valve,                            appendiceal orifice, and rectum were photographed. Scope In: 9:52:37 AM Scope Out: 10:12:42 AM Scope Withdrawal Time: 0 hours 16 minutes 53 seconds  Total Procedure Duration: 0 hours 20 minutes 5 seconds  Findings:                 The perianal and digital rectal examinations were                            normal.                           The terminal ileum appeared normal.                           Multiple small-mouthed diverticula were found in                            the entire colon.                           A 4 mm polyp was found in the rectum. The polyp was  sessile. The polyp was removed with a cold snare.                            Resection and retrieval were complete. This was                            removed during scope insertion at the beginning of                            the exam. Upon scope withdrawal there was                            persistent oozing at the site. Snare tip                            coagulation was applied to the area with good                            result / immediate hemostasis.                           Internal hemorrhoids were found during retroflexion.                           The exam was otherwise without abnormality. Complications:            No immediate complications. Estimated blood loss:                            Minimal. Estimated Blood Loss:     Estimated blood loss was minimal. Impression:               - The examined portion of the ileum was normal.                           - Diverticulosis in the  entire examined colon.                           - One 4 mm polyp in the rectum, removed with a cold                            snare. Resected and retrieved. Snare tip                            coagulation applied to provide hemostasis for                            persistent oozing.                           - Internal hemorrhoids.                           - The examination was otherwise normal.  No obvious cause for symptoms noted on colonoscopy. Recommendation:           - Patient has a contact number available for                            emergencies. The signs and symptoms of potential                            delayed complications were discussed with the                            patient. Return to normal activities tomorrow.                            Written discharge instructions were provided to the                            patient.                           - Resume previous diet.                           - Continue present medications.                           - Await pathology results. Further recommendations                            pending pathology results from EGD [place]. Deriyah Kunath, MD 12/02/2019 10:19:18 AM This report has been signed electronically.

## 2019-12-02 NOTE — Patient Instructions (Signed)
Impression/Recommendations:  Gastritis handout given to patient. Diverticulosis handout given to patient. Polyp handout given to patient. Hemorrhoid handout given to patient.  Resume previous diet. Continue present medications. Await pathology results.  Further recommendations pending pathology results from EGD.  YOU HAD AN ENDOSCOPIC PROCEDURE TODAY AT [facility]:   Refer to the procedure report that was given to you for any specific questions about what was found during the examination.  If the procedure report does not answer your questions, please call your gastroenterologist to clarify.  If you requested that your care partner not be given the details of your procedure findings, then the procedure report has been included in a sealed envelope for you to review at your convenience later.  YOU SHOULD EXPECT: Some feelings of bloating in the abdomen. Passage of more gas than usual.  Walking can help get rid of the air that was put into your GI tract during the procedure and reduce the bloating. If you had a lower endoscopy (such as a colonoscopy or flexible sigmoidoscopy) you may notice spotting of blood in your stool or on the toilet paper. If you underwent a bowel prep for your procedure, you may not have a normal bowel movement for a few days.  Please Note:  You might notice some irritation and congestion in your nose or some drainage.  This is from the oxygen used during your procedure.  There is no need for concern and it should clear up in a day or so.  SYMPTOMS TO REPORT IMMEDIATELY:   Following lower endoscopy (colonoscopy or flexible sigmoidoscopy):  Excessive amounts of blood in the stool  Significant tenderness or worsening of abdominal pains  Swelling of the abdomen that is new, acute  Fever of 100F or higher   Following upper endoscopy (EGD)  Vomiting of blood or coffee ground material  New chest pain or pain under the shoulder blades  Painful or  persistently difficult swallowing  New shortness of breath  Fever of 100F or higher  Black, tarry-looking stools  For urgent or emergent issues, a gastroenterologist can be reached at any hour by calling [phone].   DIET:  We do recommend a small meal at first, but then you may proceed to your regular diet.  Drink plenty of fluids but you should avoid alcoholic beverages for 24 hours.  ACTIVITY:  You should plan to take it easy for the rest of today and you should NOT DRIVE or use heavy machinery until tomorrow (because of the sedation medicines used during the test).    FOLLOW UP: Our staff will call the number listed on your records 48-72 hours following your procedure to check on you and address any questions or concerns that you may have regarding the information given to you following your procedure. If we do not reach you, we will leave a message.  We will attempt to reach you two times.  During this call, we will ask if you have developed any symptoms of COVID 19. If you develop any symptoms (ie: fever, flu-like symptoms, shortness of breath, cough etc.) before then, please call [phone].  If you test positive for Covid 19 in the 2 weeks post procedure, please call and report this information to [place].    If any biopsies were taken you will be contacted by phone or by letter within the next 1-3 weeks.  Please call us at [phone] if you have not heard about the biopsies in 3 weeks.  SIGNATURES/CONFIDENTIALITY: You and/or your care partner have signed paperwork which will be entered into your electronic medical record.  These signatures attest to the fact that that the information above on your After Visit Summary has been reviewed and is understood.  Full responsibility of the confidentiality of this discharge information lies with you and/or your care-partner.

## 2019-12-02 NOTE — Progress Notes (Signed)
Called to room to assist during endoscopic procedure.  Patient ID and intended procedure confirmed with present staff. Received instructions for my participation in the procedure from the performing physician.  

## 2019-12-02 NOTE — Op Note (Signed)
[facility] Patient Name: Martin Hopkins: 12/02/2019 9:34 AM MRN: [id number] Endoscopist: Remo Lipps P. Havery [name] , MD Age: 69 Referring MD:  [name]: 1950-12-07 Gender: Male Account #: [id number] Procedure:                Upper GI endoscopy Indications:              Abdominal bloating, Early satiety, Weight loss - on                            an empiric trial of omeprazole 40mg  with some                            slight improvement Medicines:                Monitored Anesthesia Care Procedure:                Pre-Anesthesia Assessment:                           - Prior to the procedure, a History and Physical                            was performed, and patient medications and                            allergies were reviewed. The patient's tolerance of                            previous anesthesia was also reviewed. The risks                            and benefits of the procedure and the sedation                            options and risks were discussed with the patient.                            All questions were answered, and informed consent                            was obtained. Prior Anticoagulants: The patient has                            taken no previous anticoagulant or antiplatelet                            agents. ASA Grade Assessment: III - A patient with                            severe systemic disease. After reviewing the risks                            and benefits, the patient was deemed in  satisfactory condition to undergo the procedure.                           After obtaining informed consent, the endoscope was                            passed under direct vision. Throughout the                            procedure, the patient's blood pressure, pulse, and                            oxygen saturations were monitored continuously. The                            Endoscope was introduced through  the mouth, and                            advanced to the second part of duodenum. The upper                            GI endoscopy was accomplished without difficulty.                            The patient tolerated the procedure well. Scope In: Scope Out: Findings:                 Esophagogastric landmarks were identified: the                            Z-line was found at 40 cm, the gastroesophageal                            junction was found at 40 cm and the upper extent of                            the gastric folds was found at 40 cm from the                            incisors.                           The exam of the esophagus was otherwise normal.                           Patchy moderate inflammation characterized by                            erythema and friability was found in the gastric                            antrum, without focal erosion or ulceration.                           The  exam of the stomach was otherwise normal.                           Biopsies were taken with a cold forceps in the                            gastric body, at the incisura and in the gastric                            antrum for Helicobacter pylori testing.                           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 The ampulla, duodenal bulb and second                            portion of the duodenum were normal. Biopsies for                            histology were taken from the 2nd portion and bulb                            with a cold forceps for evaluation of celiac                            disease. Complications:            No immediate complications. Estimated blood loss:                            Minimal. Estimated Blood Loss:     Estimated blood loss was minimal. Impression:               - Esophagogastric landmarks identified.                           - Normal esophagus.                           - Gastritis.                            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                           - Normal ampulla, duodenal bulb and second portion  of the duodenum. Biopsied. Recommendation:           - Patient has a contact number available for                            emergencies. The signs and symptoms of potential                            delayed complications were discussed with the                            patient. Return to normal activities tomorrow.                            Written discharge instructions were provided to the                            patient.                           - Resume previous diet.                           - Continue present medications.                           - Await pathology results with further                            recommendations. If pathology negative and symptoms                            persist despite trial of PPI, consideration for                            cross sectional imaging. Remo Lipps P. Jong Rickman, MD 12/02/2019 10:25:09 AM This report has been signed electronically.

## 2019-12-02 NOTE — Progress Notes (Signed)
To PACU, VSS. Report to [email or web address] 

## 2019-12-02 NOTE — Progress Notes (Signed)
VS by CW. Temp by JB. 

## 2019-12-03 ENCOUNTER — Other Ambulatory Visit: Payer: Self-pay

## 2019-12-03 ENCOUNTER — Telehealth: Payer: Self-pay | Admitting: Cardiology

## 2019-12-03 ENCOUNTER — Telehealth: Payer: Self-pay | Admitting: Gastroenterology

## 2019-12-03 DIAGNOSIS — R7989 Other specified abnormal findings of blood chemistry: Secondary | ICD-10-CM

## 2019-12-03 NOTE — Progress Notes (Signed)
Cardiology Office Note    Date:  12/04/2019   ID:  Martin Hopkins, DOB 20-Oct-1951, MRN [id number]  PCP:  Harlan Stains, MD  Cardiologist: Fransico Him, MD EPS: None  Chief Complaint  Patient presents with  . Chest Pain    History of Present Illness:  Martin Hopkins is a 69 y.o. male with history of hypertension, OSA on CPAP, chest pain, coronary CTA 02/2019 calcium score 944 with moderate CAD in the proximal LAD, proximal and mid circumflex and mild CAD in the proximal mid RCA.  FFR showed no flow-limiting lesions.    He was admitted to [facility] 11/09/2019 with shortness of breath and right-sided chest pain as well as 10 pound weight gain over few weeks.  Right and left heart cath showed widely patent coronary arteries with nonobstructive LAD and RCA less than 50%, slight reduction in TIMI flow in the LAD raising question of microvascular disease and EF of 50% with normal LVEDP.  2D echo normal LVEF 60 to 65%.  D-dimer was negative, troponins negative.  He was treated with IV Lasix and started on amlodipine for possible microvascular angina.  Prior to discharge she had recurrent shortness of breath and chest CT was negative for PE.  Patient saw Dr. Radford Pax back 11/22/2019 turned out he had not started the amlodipine and went back on Imdur.  He was discharged home on [name] fenofibrate and a statin but only started back on the statin.  LDL was 185 in the hospital.  Was not taking his Toprol because of low blood pressures so Dr. Radford Pax asked him to keep off the Toprol and restart amlodipine 2.5 mg daily.  She also felt some of his symptoms could be GERD with esophageal spasm and aspiration since a lot of his symptoms are at night.  She referred the patient to Dr. Debara Pickett with lipid clinic for possible PCSK9 inhibitor.  Patient was added onto my schedule for recurrent chest pain and shortness of breath.  Yesterday had an episode of chest tightness/squeezing-lasted 4 hrs, some shortness of breath, didn't  take a NTG. Occurred after eating grits and drinking 20 ounces of coffee.Walking around made it feel better.Job is very stressful but he didn't feel stressed yesterday. BP doing much better on amlodipine. No regular exercise. Feels great today. Has lost 11 lbs on lasix.  Past Medical History:  Diagnosis Date  . Adenomatous polyp    Dr Cristina Gong  . Allergic rhinitis   . ED (erectile dysfunction)   . Fatty liver   . GERD (gastroesophageal reflux disease)    mild  . H/O: duodenal ulcer 1982  . Hypercholesteremia   . Hypertension   . Hypogonadism male   . Hypothyroidism   . Lumbar disc disease    L5--Dr Cay Schillings  . Obesity (BMI 30-39.9)   . OSA (obstructive sleep apnea)    AHI 40/hr now on CPAP 9cm H2O  . Sleep apnea     Past Surgical History:  Procedure Laterality Date  . colonscopy  10/2015   [facility]  . LUMBAR LAMINECTOMY  09/28/2011   Procedure: MICRODISCECTOMY LUMBAR LAMINECTOMY;  Surgeon: Tobi Bastos;  Location: [facility];  Service: Orthopedics;  Laterality: Left;  Hemi-Laminectomy, Microdiscectomy  Lumbar 5 - Sacral 1 Left    . NASAL FRACTURE SURGERY  10/1971  . NECK SURGERY  2014  . OTHER SURGICAL HISTORY  1972   nasal surgery   . OTHER SURGICAL HISTORY  2011   right trigger finger surgey   .  right hand surgery     remove knots  . RIGHT/LEFT HEART CATH AND CORONARY ANGIOGRAPHY N/A 11/13/2019   Procedure: RIGHT/LEFT HEART CATH AND CORONARY ANGIOGRAPHY;  Surgeon: Belva Crome, MD;  Location: [facility];  Service: Cardiovascular;  Laterality: N/A;  . VASECTOMY      Current Medications: Current Meds  Medication Sig  . amLODipine (NORVASC) 2.5 MG tablet Take 1 tablet (2.5 mg total) by mouth daily.  Marland Kitchen aspirin EC 81 MG tablet Take 1 tablet (81 mg total) by mouth daily.  . furosemide (LASIX) 20 MG tablet Take 1 tablet (20 mg total) by mouth daily.  Marland Kitchen icosapent Ethyl ([name]) 1 g capsule Take 2 g by mouth 2 (two) times daily.  Marland Kitchen levothyroxine (SYNTHROID) 175 MCG  tablet Take 175 mcg by mouth daily before breakfast.  . nitroGLYCERIN (NITROSTAT) 0.4 MG SL tablet Place 1 tablet (0.4 mg total) under the tongue every 5 (five) minutes x 3 doses as needed for chest pain.  Marland Kitchen omeprazole (PRILOSEC) 40 MG capsule Take 40 mg by mouth every morning.  . polyethylene glycol (MIRALAX) 17 g packet Take 17 g by mouth daily.  . simvastatin (ZOCOR) 40 MG tablet Take 40 mg by mouth every evening.  . [DISCONTINUED] icosapent Ethyl ([name]) 1 g capsule TAKE 2 CAPSULES BY MOUTH TWICE A DAY     Allergies:   Penicillins and Lipitor [atorvastatin]   Social History   Socioeconomic History  . Marital status: Married    Spouse name: Not on file  . Number of children: 3  . Years of education: Not on file  . Highest education level: Not on file  Occupational History  . Not on file  Tobacco Use  . Smoking status: Former Smoker    Quit date: 11/07/1990    Years since quitting: 29.0  . Smokeless tobacco: Never Used  Substance and Sexual Activity  . Alcohol use: Yes    Comment: rare  . Drug use: No  . Sexual activity: Not on file  Other Topics Concern  . Not on file  Social History Narrative  . Not on file   Social Determinants of Health   Financial Resource Strain:   . Difficulty of Paying Living Expenses: Not on file  Food Insecurity:   . Worried About Charity fundraiser in the Last Year: Not on file  . Ran Out of Food in the Last Year: Not on file  Transportation Needs:   . Lack of Transportation (Medical): Not on file  . Lack of Transportation (Non-Medical): Not on file  Physical Activity:   . Days of Exercise per Week: Not on file  . Minutes of Exercise per Session: Not on file  Stress:   . Feeling of Stress : Not on file  Social Connections:   . Frequency of Communication with Friends and Family: Not on file  . Frequency of Social Gatherings with Friends and Family: Not on file  . Attends Religious Services: Not on file  . Active Member of Clubs or  Organizations: Not on file  . Attends Archivist Meetings: Not on file  . Marital Status: Not on file     Family History:  The patient's family history includes CAD in his father and paternal grandfather; Hypertension in his brother and mother; Peripheral vascular disease in his brother and father.   ROS:   Please see the history of present illness.    ROS All other systems reviewed and are negative.  PHYSICAL EXAM:   VS:  BP 122/76   Pulse 90   Ht 5\' 10"  (1.778 m)   Wt 235 lb (106.6 kg)   SpO2 95%   BMI 33.72 kg/m   Physical Exam  [id number], in no acute distress  Neck: no JVD, carotid bruits, or masses Cardiac:RRR; no murmurs, rubs, or gallops  Respiratory:  clear to auscultation bilaterally, normal work of breathing GI: soft, nontender, nondistended, + BS Ext: without cyanosis, clubbing, or edema, Good distal pulses bilaterally Neuro:  Alert and Oriented x 3 Psych: euthymic mood, full affect  Wt Readings from Last 3 Encounters:  12/04/19 235 lb (106.6 kg)  12/02/19 239 lb (108.4 kg)  11/27/19 239 lb 8 oz (108.6 kg)      Studies/Labs Reviewed:   EKG:  EKG is  ordered today.  The ekg ordered today demonstrates  NSR, normal EKG no change  Recent Labs: 09/09/2019: ALT 57 11/12/2019: B Natriuretic Peptide 18.4 11/13/2019: BUN 20; Creatinine, Ser 1.31; Hemoglobin 14.3; Platelets 208; Potassium 3.5; Sodium 133   Lipid Panel    Component Value Date/Time   CHOL 247 (H) 11/13/2019 0548   CHOL 138 09/09/2019 0809   TRIG 141 11/13/2019 0548   HDL 34 (L) 11/13/2019 0548   HDL 31 (L) 09/09/2019 0809   CHOLHDL 7.3 11/13/2019 0548   VLDL 28 11/13/2019 0548   LDLCALC 185 (H) 11/13/2019 0548   LDLCALC 75 09/09/2019 0809    Additional studies/ records that were reviewed today include:  Cardiac catheterization 11/13/2019  Normal right heart pressures including pulmonary wedge pressure.  Normal mixed venous O2 saturation.  Widely patent coronary arteries with  nonobstructive LAD and right coronary disease less than 50%.  LAD does demonstrate slight reduction in TIMI flow which raises the question of microvascular disease.  Low normal LV systolic function.  EF 50% with normal LVEDP.   RECOMMENDATIONS:    Discontinue nitrates.  Consider calcium channel blocker therapy and or ARB therapy to manage potential microvascular obstructive disease.  Aggressive risk factor modification.  Long-term monitoring to rule out transient arrhythmia as the source of symptoms.    Echo 11/13/2019 IMPRESSIONS      1. Left ventricular ejection fraction, by visual estimation, is 60 to 65%. The left ventricle has normal function. There is mildly increased left ventricular hypertrophy.  2. Global right ventricle has normal systolic function.The right ventricular size is normal.  3. Left atrial size was normal.  4. Right atrial size was normal.  5. Mild mitral annular calcification.  6. The mitral valve is abnormal. No evidence of mitral valve regurgitation.  7. The aortic valve was not well visualized. Aortic valve regurgitation is not visualized. No evidence of aortic valve stenosis.  8. The pulmonic valve was not well visualized. Pulmonic valve regurgitation is not visualized.  9. The tricuspid valve is normal in structure. 10. TR signal is inadequate for assessing pulmonary artery systolic pressure.   Coronary CTA 03/2019 FINDINGS: FFRct analysis was performed on the original cardiac CT angiogram dataset. Diagrammatic representation of the FFRct analysis is provided in a separate PDF document in PACS. This dictation was created using the PDF document and an interactive 3D model of the results. 3D model is not available in the EMR/PACS. Normal FFR range is >0.80.   1. Left Main:  No significant stenosis.   2. LAD: No significant stenosis. 3. LCX: No significant stenosis. 4. RCA: No significant stenosis.   IMPRESSION: 1. CT FFR analysis didn't show any  significant  stenosis. Aggressive risk factor modification is recommended.     Electronically Signed   By: Ena Dawley   On: 03/24/2019 09:01  FINDINGS: FFRct analysis was performed on the original cardiac CT angiogram dataset. Diagrammatic representation of the FFRct analysis is provided in a separate PDF document in PACS. This dictation was created using the PDF document and an interactive 3D model of the results. 3D model is not available in the EMR/PACS. Normal FFR range is >0.80.   1. Left Main:  No significant stenosis.   2. LAD: No significant stenosis. 3. LCX: No significant stenosis. 4. RCA: No significant stenosis.   IMPRESSION: 1. CT FFR analysis didn't show any significant stenosis. Aggressive risk factor modification is recommended.     Electronically Signed   By: Ena Dawley   On: 03/24/2019 09:01      ASSESSMENT:    1. Chest pain due to myocardial ischemia, unspecified ischemic chest pain type   2. Coronary artery disease involving native coronary artery of native heart without angina pectoris   3. Essential hypertension   4. Mixed hyperlipidemia   5. Obesity (BMI 30-39.9)   6. OSA (obstructive sleep apnea)      PLAN:  In order of problems listed above:  Chest pain and shortness of breath-4 hr episode yesterday relieved with moving around.Occurred after eating grits and 20 ounce coffee. Didn't take NTG. EKG normal today, unchanged.Patient doesn't want to change his meds today. Asked him to take NTG next time it occurs. Also seeing GI.   CAD with nonobstructive disease in LAD and RCA on cath 11/13/19.  Being treated for possible microvascular angina with amlodipine 2.5 mg daily.  Toprol was held because of low blood pressures.  Also question of GERD with esophageal spasm.  Extensive work-up as described above with recent CT negative for PE  Essential hypertension BP controlled  Hyperlipidemia referred to Dr. Debara Pickett for possible  PCSK9  inhibitors LDL 185 11/13/19 on statin but not on vascepa or fenofibrate because of side effects  Obesity-has lost weight  OSA on CPAP-brought chip for [employer] to download     Medication Adjustments/Labs and Tests Ordered: Current medicines are reviewed at length with the patient today.  Concerns regarding medicines are outlined above.  Medication changes, Labs and Tests ordered today are listed in the Patient Instructions below. There are no Patient Instructions on file for this visit.   Sumner Boast, PA-C  12/04/2019 8:28 AM    [facility] [place], [place], [place]  [address] Phone: [phone]; Fax: [phone]

## 2019-12-03 NOTE — Telephone Encounter (Signed)
Patient sees Dr. Havery [name] for GI, had an endoscopy and colonoscopy today

## 2019-12-03 NOTE — Telephone Encounter (Signed)
Patient's medication list is up to date. This morning his blood pressure was 127/70. HE states that his blood pressure has been averaging in the 130s/80s and the highest it has been in the past couple weeks was 145/88. Patient also states that he has lost about 26 pounds in one month.

## 2019-12-03 NOTE — Telephone Encounter (Signed)
Patient states that he has been having chest pressure for about a month now that comes and goes. He reports that recently it comes on about 4-5 times per day and states that it is very tight/squeezing feeling. He also reports SOB. He has felt like he was going to pass out a few times. Patient states that he has been to the ER twice this month for the same symptoms and they have not been able to figure anything out. Patient was scheduled an appointment with Estella Husk, PA-C tomorrow morning. Patient states that he has not taking any nitroglycerin but that he will take one when he gets home to see if there is any relief. I advised him that he may need to go back to the ER if this continues without relief. Patient verbalized understanding.

## 2019-12-03 NOTE — Telephone Encounter (Signed)
New Patient   Pt c/o Shortness Of Breath: STAT if SOB developed within the last 24 hours or pt is noticeably SOB on the phone  1. Are you currently SOB (can you hear that pt is SOB on the phone)?No did not hear it in the patients voice  2. How long have you been experiencing SOB? On and off for a few days  3. Are you SOB when sitting or when up moving around? moving  4. Are you currently experiencing any other symptoms? Some pressure in chest   Patient states that this has been on ongoing issue and he has had several test done and nothing. He is wanting an appt to come in and discuss because the sob and some pressure in his chest is more frequent and just more and more concerning to him. I have scheduled the patient to see Estella Husk 1/27.

## 2019-12-03 NOTE — Telephone Encounter (Signed)
I am very concerned that his CP is GI related especially in light of recent weight loss.  I am not convinced his CP is related to cardiac etiology.  Please get him in with [facility] ASAP this week.

## 2019-12-03 NOTE — Telephone Encounter (Signed)
Pt had endo yesterday and states that his esophagus hurts a lot today, it is very uncomfortable when he tries to eat something. He would like some advice.

## 2019-12-03 NOTE — Telephone Encounter (Signed)
Hi Esther,  Thanks for letting me know. No, not common for this to happen but can happen rarely. We did not do a dilation or any biopsies of his esophagus, not sure if he is having discomfort in his throat? Perhaps the scope caused mild trauma in the oropharynx. This should abate with just some time. If the discomfort is in his chest we could try some liquid carafate 10cc po q 6 hours if he is requesting something however this should just go away with some time. Tell him sorry for the inconvenience and that he is feeling like this. If no better he should call back in the next 24-48 hours, or sooner if any worsening. Thanks

## 2019-12-03 NOTE — Telephone Encounter (Signed)
Please verify meds that patient is now taking and find out what his BP is

## 2019-12-04 ENCOUNTER — Other Ambulatory Visit: Payer: Self-pay

## 2019-12-04 ENCOUNTER — Encounter: Payer: Self-pay | Admitting: Physician Assistant

## 2019-12-04 ENCOUNTER — Ambulatory Visit: Payer: PPO | Admitting: Physician Assistant

## 2019-12-04 ENCOUNTER — Telehealth: Payer: Self-pay | Admitting: *Deleted

## 2019-12-04 ENCOUNTER — Telehealth: Payer: Self-pay

## 2019-12-04 VITALS — BP 122/76 | HR 90 | Ht 70.0 in | Wt 235.0 lb

## 2019-12-04 DIAGNOSIS — E669 Obesity, unspecified: Secondary | ICD-10-CM

## 2019-12-04 DIAGNOSIS — G4733 Obstructive sleep apnea (adult) (pediatric): Secondary | ICD-10-CM | POA: Diagnosis not present

## 2019-12-04 DIAGNOSIS — E782 Mixed hyperlipidemia: Secondary | ICD-10-CM

## 2019-12-04 DIAGNOSIS — I259 Chronic ischemic heart disease, unspecified: Secondary | ICD-10-CM | POA: Diagnosis not present

## 2019-12-04 DIAGNOSIS — I2510 Atherosclerotic heart disease of native coronary artery without angina pectoris: Secondary | ICD-10-CM

## 2019-12-04 DIAGNOSIS — I10 Essential (primary) hypertension: Secondary | ICD-10-CM | POA: Diagnosis not present

## 2019-12-04 MED ORDER — SUCRALFATE 1 GM/10ML PO SUSP: 1.0000 g | Freq: Four times a day (QID) | ORAL | 1 refills | Status: DC

## 2019-12-04 NOTE — Telephone Encounter (Signed)
Called patient and let him know Dr. Havery [name] said it would be rare to have throat pain from an EGD, since he didn't do a dilation of biopsy. Patient said it hurt this morning in the chest area of the esophagus when he drank something. I sent in order for Carafate and gave patient instructions for taking. I asked the patient to let us know if he isn't feeling better in 24-48 hours.

## 2019-12-04 NOTE — Patient Instructions (Signed)
Medication Instructions:  Your physician recommends that you continue on your current medications as directed. Please refer to the Current Medication list given to you today.  *If you need a refill on your cardiac medications before your next appointment, please call your pharmacy*  Lab Work: None ordered  If you have labs (blood work) drawn today and your tests are completely normal, you will receive your results only by: Marland Kitchen MyChart Message (if you have MyChart) OR . A paper copy in the mail If you have any lab test that is abnormal or we need to change your treatment, we will call you to review the results.  Testing/Procedures: None ordered  Follow-Up: At [facility], you and your health needs are our priority.  As part of our continuing mission to provide you with exceptional heart care, we have created designated Provider Care Teams.  These Care Teams include your primary Cardiologist (physician) and Advanced Practice Providers (APPs -  Physician Assistants and Nurse Practitioners) who all work together to provide you with the care you need, when you need it.  Your next appointment:   Keep your scheduled follow-up appointments as scheduled

## 2019-12-04 NOTE — Telephone Encounter (Signed)
Patient returned your call, please call patient one more time.   

## 2019-12-04 NOTE — Telephone Encounter (Signed)
See phone note

## 2019-12-04 NOTE — Telephone Encounter (Signed)
I've called the patient at all numbers listed this AM, including cell twice, no answer, left message. I did get his message yesterday and sent back to nursing staff with recommendations yesterday afternoon, I guess no one called him back. I will call him again shortly

## 2019-12-04 NOTE — Telephone Encounter (Signed)
I called the patient back.  He has had some odynophagia since the procedure as outlined.  No shortness of breath or chest pain otherwise.  He states it goes away eventually and then recurs again when he eats.  No dilation was performed or biopsies. Not sure what is causing this, it's possible he has some superficial endoscopic trauma / laceration that potentially could be causing this from the endoscopy, but nothing obviously noted during his exam. He feels comfortable at this time..  It seems to be mostly in his upper to lower chest, not throat.  He states yesterday it bothered him much more than he has today so far, states it is very mild now.  We provided him some Carafate to take as needed.  Hopefully this helps and just resolves with time.  If not and symptoms worsen or persist I asked him to contact me.  He agreed

## 2019-12-04 NOTE — Telephone Encounter (Signed)
Left message to please call back. °

## 2019-12-04 NOTE — Telephone Encounter (Signed)
  Follow up Call-  Call back number 12/02/2019  Post procedure Call Back phone  # [phone]  Permission to leave phone message Yes  Some recent data might be hidden     Patient questions:  Do you have a fever, pain , or abdominal swelling? Yes.   Pain Score  3 *  Have you tolerated food without any problems? No.  Have you been able to return to your normal activities? Yes.    Do you have any questions about your discharge instructions: Diet   No. Medications  No. Follow up visit  No.  Do you have questions or concerns about your Care? Yes.    Patient reports pain in his breast bone area with eating and drinking since his procedure. States the onset is about 15 minutes after eating or drinking and the duration is about 2 hours, but that after that time the pain does resolve. Reports the pain as "it feels like someone is squeezing my esophagus." States this pain started only after his procedure on Monday. Reports he is continuing to take his PPI BID 30 minutes before meals. States he called the office yesterday and left a message but has not heard back. Will route this message to Dr. Havery [name].  Actions: * If pain score is 4 or above: No action needed, pain <4.  1. Have you developed a fever since your procedure? no  2.   Have you had an respiratory symptoms (SOB or cough) since your procedure? no  3.   Have you tested positive for COVID 19 since your procedure no  4.   Have you had any family members/close contacts diagnosed with the COVID 19 since your procedure?  no   If yes to any of these questions please route to Joylene John, RN and Alphonsa Gin, Therapist, sports.

## 2019-12-05 ENCOUNTER — Other Ambulatory Visit: Payer: Self-pay | Admitting: Gastroenterology

## 2019-12-05 DIAGNOSIS — R1033 Periumbilical pain: Secondary | ICD-10-CM

## 2019-12-06 ENCOUNTER — Other Ambulatory Visit: Payer: PPO

## 2019-12-06 DIAGNOSIS — R7989 Other specified abnormal findings of blood chemistry: Secondary | ICD-10-CM | POA: Diagnosis not present

## 2019-12-09 NOTE — ED Provider Notes (Signed)
Late entry for patient seen 11/09/19 Physical Exam Vital signs-  Temp 98.1 hr 90 bp 153/84 rr16 sats 96% Awake and alert General WDWN male nad HEENT- atraumatic, normal Neck- no jvd cv-RRR Lungs crackles at bases Abdomen- soft and nttp Extremities- pitting edema to knee bilateral shins Alert and oriented Psych normal mood  This is a late entry.  PE was reconstructed from my notes, studies, nurses notes.     Pattricia Boss, MD 12/09/19 [phone]

## 2019-12-10 ENCOUNTER — Telehealth: Payer: Self-pay | Admitting: Gastroenterology

## 2019-12-10 DIAGNOSIS — H2513 Age-related nuclear cataract, bilateral: Secondary | ICD-10-CM | POA: Diagnosis not present

## 2019-12-10 DIAGNOSIS — H40013 Open angle with borderline findings, low risk, bilateral: Secondary | ICD-10-CM | POA: Diagnosis not present

## 2019-12-10 DIAGNOSIS — H5213 Myopia, bilateral: Secondary | ICD-10-CM | POA: Diagnosis not present

## 2019-12-10 DIAGNOSIS — H40053 Ocular hypertension, bilateral: Secondary | ICD-10-CM | POA: Diagnosis not present

## 2019-12-10 NOTE — Telephone Encounter (Signed)
The pt has COVID vaccine on Friday and Hep C inj on 2/15.  He was advised to call his PCP and confirm that there are no issues with having both vaccines.  The pt has been advised of the information and verbalized understanding.

## 2019-12-10 NOTE — Telephone Encounter (Signed)
Patient is calling states he has an upcoming appt for his second covid vaccine and he is asking if it would interfere with having his hep c injection

## 2019-12-12 LAB — HEMOCHROMATOSIS DNA-PCR(C282Y,H63D)

## 2019-12-19 ENCOUNTER — Other Ambulatory Visit: Payer: Self-pay | Admitting: Family Medicine

## 2019-12-19 DIAGNOSIS — R0600 Dyspnea, unspecified: Secondary | ICD-10-CM | POA: Diagnosis not present

## 2019-12-19 DIAGNOSIS — Z20828 Contact with and (suspected) exposure to other viral communicable diseases: Secondary | ICD-10-CM | POA: Diagnosis not present

## 2019-12-19 DIAGNOSIS — Z03818 Encounter for observation for suspected exposure to other biological agents ruled out: Secondary | ICD-10-CM | POA: Diagnosis not present

## 2019-12-19 DIAGNOSIS — R634 Abnormal weight loss: Secondary | ICD-10-CM

## 2019-12-19 DIAGNOSIS — R7989 Other specified abnormal findings of blood chemistry: Secondary | ICD-10-CM | POA: Diagnosis not present

## 2019-12-19 DIAGNOSIS — Z20822 Contact with and (suspected) exposure to covid-19: Secondary | ICD-10-CM | POA: Diagnosis not present

## 2019-12-19 DIAGNOSIS — R0789 Other chest pain: Secondary | ICD-10-CM | POA: Diagnosis not present

## 2019-12-20 ENCOUNTER — Other Ambulatory Visit: Payer: Self-pay | Admitting: Family Medicine

## 2019-12-20 DIAGNOSIS — R0789 Other chest pain: Secondary | ICD-10-CM

## 2019-12-30 ENCOUNTER — Ambulatory Visit
Admission: RE | Admit: 2019-12-30 | Discharge: 2019-12-30 | Disposition: A | Discharge status: Home / Self Care | Payer: PPO | Source: Ambulatory Visit | Attending: Family Medicine | Admitting: Family Medicine

## 2019-12-30 DIAGNOSIS — K5790 Diverticulosis of intestine, part unspecified, without perforation or abscess without bleeding: Secondary | ICD-10-CM | POA: Diagnosis not present

## 2019-12-30 DIAGNOSIS — R790 Abnormal level of blood mineral: Secondary | ICD-10-CM | POA: Diagnosis not present

## 2019-12-30 DIAGNOSIS — E785 Hyperlipidemia, unspecified: Secondary | ICD-10-CM | POA: Diagnosis not present

## 2019-12-30 DIAGNOSIS — R634 Abnormal weight loss: Secondary | ICD-10-CM

## 2019-12-30 MED ORDER — IOPAMIDOL (ISOVUE-300) INJECTION 61%
125.0000 mL | Freq: Once | INTRAVENOUS | Status: AC | PRN
  Administered 2019-12-30: 125 mL via INTRAVENOUS

## 2020-01-01 ENCOUNTER — Ambulatory Visit: Payer: PPO | Admitting: Pulmonary Disease

## 2020-01-01 ENCOUNTER — Other Ambulatory Visit: Payer: Self-pay

## 2020-01-01 ENCOUNTER — Encounter: Payer: Self-pay | Admitting: Pulmonary Disease

## 2020-01-01 VITALS — BP 108/60 | HR 77 | Temp 98.0°F | Ht 69.0 in | Wt 237.0 lb

## 2020-01-01 DIAGNOSIS — Z9989 Dependence on other enabling machines and devices: Secondary | ICD-10-CM | POA: Diagnosis not present

## 2020-01-01 DIAGNOSIS — R079 Chest pain, unspecified: Secondary | ICD-10-CM

## 2020-01-01 DIAGNOSIS — G4733 Obstructive sleep apnea (adult) (pediatric): Secondary | ICD-10-CM

## 2020-01-01 NOTE — Progress Notes (Signed)
Subjective:    Patient ID: Martin Hopkins, male    DOB: 08/24/1951, 69 y.o.   MRN: [id number]  Chest discomfort, chest tightness  Has been for about 2 months now Has had cardiac work-up Hospitalization with cardiac catheterization CT scan of the chest is unrevealing  Remote history of smoking, quit in 89 No history of lung disease No history of shortness of breath until recently  History of obstructive sleep apnea, compliant with CPAP Compliance data did reveal elevated AHI Machine is dated  He was recently started on Xanax This helps the symptoms  Has had EGD with no significant finding   Past Medical History:  Diagnosis Date  . Adenomatous polyp    Dr Cristina Gong  . Allergic rhinitis   . ED (erectile dysfunction)   . Fatty liver   . GERD (gastroesophageal reflux disease)    mild  . H/O: duodenal ulcer 1982  . Hypercholesteremia   . Hypertension   . Hypogonadism male   . Hypothyroidism   . Lumbar disc disease    L5--Dr Cay Schillings  . Obesity (BMI 30-39.9)   . OSA (obstructive sleep apnea)    AHI 40/hr now on CPAP 9cm H2O  . Sleep apnea    Social History   Socioeconomic History  . Marital status: Married    Spouse name: Not on file  . Number of children: 3  . Years of education: Not on file  . Highest education level: Not on file  Occupational History  . Not on file  Tobacco Use  . Smoking status: Former Smoker    Packs/day: 2.00    Years: 20.00    Pack years: 40.00    Quit date: 11/07/1992    Years since quitting: 27.1  . Smokeless tobacco: Never Used  Substance and Sexual Activity  . Alcohol use: Yes    Comment: rare  . Drug use: No  . Sexual activity: Not on file  Other Topics Concern  . Not on file  Social History Narrative  . Not on file   Social Determinants of Health   Financial Resource Strain:   . Difficulty of Paying Living Expenses: Not on file  Food Insecurity:   . Worried About Charity fundraiser in the Last Year: Not on file  . Ran  Out of Food in the Last Year: Not on file  Transportation Needs:   . Lack of Transportation (Medical): Not on file  . Lack of Transportation (Non-Medical): Not on file  Physical Activity:   . Days of Exercise per Week: Not on file  . Minutes of Exercise per Session: Not on file  Stress:   . Feeling of Stress : Not on file  Social Connections:   . Frequency of Communication with Friends and Family: Not on file  . Frequency of Social Gatherings with Friends and Family: Not on file  . Attends Religious Services: Not on file  . Active Member of Clubs or Organizations: Not on file  . Attends Archivist Meetings: Not on file  . Marital Status: Not on file  Intimate Partner Violence:   . Fear of Current or Ex-Partner: Not on file  . Emotionally Abused: Not on file  . Physically Abused: Not on file  . Sexually Abused: Not on file   Family History  Problem Relation Age of Onset  . Hypertension Mother   . CAD Father   . Peripheral vascular disease Father   . Peripheral vascular disease Brother   .  Hypertension Brother   . CAD Paternal Grandfather   . Colon cancer Neg Hx   . Liver cancer Neg Hx   . Esophageal cancer Neg Hx   . Stomach cancer Neg Hx   . Rectal cancer Neg Hx       Review of Systems  Constitutional: Positive for unexpected weight change. Negative for fever.  HENT: Negative for congestion, dental problem, ear pain, nosebleeds, postnasal drip, rhinorrhea, sinus pressure, sneezing, sore throat and trouble swallowing.   Eyes: Negative for redness and itching.  Respiratory: Positive for chest tightness and shortness of breath. Negative for cough and wheezing.   Cardiovascular: Negative for palpitations and leg swelling.  Gastrointestinal: Negative for nausea and vomiting.  Genitourinary: Negative for dysuria.  Musculoskeletal: Negative for joint swelling.  Skin: Negative for rash.  Allergic/Immunologic: Negative.  Negative for environmental allergies, food  allergies and immunocompromised state.  Neurological: Negative for headaches.  Hematological: Does not bruise/bleed easily.  Psychiatric/Behavioral: Negative for dysphoric mood. The patient is nervous/anxious.        Objective:   Physical Exam Constitutional:      Appearance: He is obese.  HENT:     Head: Normocephalic.     Nose: Nose normal.     Mouth/Throat:     Mouth: Mucous membranes are moist.     Comments: Mallampati 3, crowded oropharynx Eyes:     Extraocular Movements: Extraocular movements intact.     Pupils: Pupils are equal, round, and reactive to light.  Cardiovascular:     Rate and Rhythm: Normal rate and regular rhythm.     Pulses: Normal pulses.     Heart sounds: Normal heart sounds. No murmur. No friction rub.  Pulmonary:     Effort: Pulmonary effort is normal. No respiratory distress.     Breath sounds: Normal breath sounds. No stridor. No wheezing or rhonchi.  Musculoskeletal:        General: Normal range of motion.     Cervical back: Normal range of motion and neck supple. No rigidity.  Lymphadenopathy:     Cervical: No cervical adenopathy.  Skin:    General: Skin is warm.  Neurological:     General: No focal deficit present.     Mental Status: He is alert.  Psychiatric:        Mood and Affect: Mood normal.   Compliance data reveals 88% compliance CPAP of 9 Residual AHI of 11.2  Recent CT scan of the chest reviewed  Cardiac catheterization results reviewed Recent cardiology follow-up reviewed    Assessment & Plan:  .  Chest pain -Had cardiac evaluation with no microvascular disease noted -EGD shows no anatomical problems -Has no underlying lung disease and pain is not described as pleuritic -Recent CT chest is not significant for any abnormality  -Optimization of treatment for microvascular disease as discussed by cardiology  Regarding obstructive sleep apnea -Machine is dated -We will request for a home sleep study  Continue weight  loss efforts  Continue Xanax  Risk profile modification  Plan Home sleep study  Continue efforts as discussed with cardiology  I do not see any benefit of a pulmonary function test at the present time  I will follow up with patient in about 3 months  We will upgrade his CPAP following study

## 2020-01-01 NOTE — Patient Instructions (Signed)
For your ongoing chest discomfort  I believe this has been worked up extensively If the Xanax continues to help I will say it is low enough of a dose not to put you at risk  Regarding obstructive sleep apnea We will schedule you for a home sleep study We will update you with results once study is reviewed Contact medical supply company to upgrade your machine  I will follow-up with you in about 3 months  Call with significant concerns

## 2020-01-03 ENCOUNTER — Other Ambulatory Visit: Payer: PPO

## 2020-01-06 ENCOUNTER — Encounter: Payer: Self-pay | Admitting: Physician Assistant

## 2020-01-06 NOTE — Progress Notes (Addendum)
Cardiology Office Note  Date: 01/07/2020   ID: Martin Hopkins, DOB 05-19-1951, MRN [id number]  PCP:  Harlan Stains, MD  Cardiologist:  Fransico Him, MD Electrophysiologist:  None   Chief Complaint  Patient presents with  . Chest Pain    History of Present Illness: Martin Hopkins is a 69 y.o. male with a history of HTN, OSA on CPAP, frequent chest pain, nonobstructive CAD.   He has been evaluated several times over the past few years for intermittent chest pain requiring testing. Coronary CTA in 04/2019 showed CCS 944 with moderate CAD proximal LAD, proximal and mid circumflex and mild CAD and proximal mid RCA.  FFR's were normal.  Presented to [facility] emergency room on November 09, 2019 shortness of breath and right-sided chest CP, 10 lb weight gain 4-5 weeks. Thought to be  CHF exacerbation.  RHC/LHC showed nonobstructive disease / LAD and RCA less than 50%.  Slight reduction in TIMI flow in the LAD/question microvascular disease. Echo with EF of 60 to 65%. R heart pressure were normal. D-dimer negative, troponins negative, given IV Lasix. Imdur was changed to amlodipine for possible microvascular angina.  Despite cardiac optimization, had recurrent shortness of breath with negative PE by CT. He was discharged on Lasix 20 mg daily. His LE edema improved.  He called the office a few days later stating he had SOB that he could not walk off. Patient had not started amlodipine and restarted Imdur.  [name] home on [name], fenofibrate, statin.  He started the statin but did not begin the [name] / fenofibrate.  LDL elevated 185.  He was not taking his Toprol due to low blood pressures.  Toprol was not restarted.  Amlodipine 2.5 mg was started. Symptoms were felt to be possibly related to GERD/esophageal spasm/aspiration. He was referred to lipid clinic by Dr. Debara Pickett for possible initiation of PCSK9 inhibitor.  He was seen again 12/04/19 for an episode of chest tightness/squeezing last approx. 4hrs  /sob a day before last visit. Occurred after eating grits and coffee. Stated he lost 11 lbs after taking lasix. Medical therapy was continued.  Patient was referred to [facility] GI secondary to weight loss of 26 lbs in 1 month and suspicion the CP is non-cardiac in origin. Recently had an EGD and Colonoscopy.  He had some gastritis and a colon polyp in his intestine was some diverticular disease which he has had for quite some time. Patient had an elevated ferritin level on GI lab work and a genetic test for hemochromatosis was performed by GI and was negative. Patient has diffuse NAFLD followed by GI.  Recent Chest CT: Neg PE, + calcified aortic atherosclerosis, chronic hepatic steatosis Recent Abdominal CT : diffuse fatty infiltration of the liver, multiple diverticula in sigmoid colon  Today, patient states has has been doing well but continues to have the sporadic chest pressure / epgastric spasm like pains which are not associated with exertion.  Patient states he was recently working in his without any issues at all.  States the activity was more than just mild exertion and he had no associated progressive anginal or exertional symptoms, palpitations or arrhythmias, orthostatic symptoms, stroke or TIA-like symptoms, dyspeptic symptoms, blood in stool or urine.   He states he is scheduled to see Dr. Debara Pickett for management of lipids.  He currently takes simvastatin 40 mg and amlodipine.  According to PA Dayna Dunn the combination of amlodipine and doses higher than 20 mg of simvastatin are associated  with myopathy such as rhabdomyolysis.  Patient states he has been taking simvastatin only because he cannot tolerate other statin medications due to myalgias.  Past Medical History:  Diagnosis Date  . Adenomatous polyp    Dr Cristina Gong  . Allergic rhinitis   . ED (erectile dysfunction)   . Fatty liver   . Gastritis   . GERD (gastroesophageal reflux disease)    mild  . H/O: duodenal ulcer 1982  .  Hypercholesteremia   . Hypertension   . Hypogonadism male   . Hypothyroidism   . Lumbar disc disease    L5--Dr Cay Schillings  . 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 Obesity (BMI 30-39.9)   . OSA (obstructive sleep apnea)    AHI 40/hr now on CPAP 9cm H2O    Past Surgical History:  Procedure Laterality Date  . colonscopy  10/2015   [facility]  . LUMBAR LAMINECTOMY  09/28/2011   Procedure: MICRODISCECTOMY LUMBAR LAMINECTOMY;  Surgeon: Tobi Bastos;  Location: [facility];  Service: Orthopedics;  Laterality: Left;  Hemi-Laminectomy, Microdiscectomy  Lumbar 5 - Sacral 1 Left    . NASAL FRACTURE SURGERY  10/1971  . NECK SURGERY  2014  . OTHER SURGICAL HISTORY  1972   nasal surgery   . OTHER SURGICAL HISTORY  2011   right trigger finger surgey   . right hand surgery     remove knots  . RIGHT/LEFT HEART CATH AND CORONARY ANGIOGRAPHY N/A 11/13/2019   Procedure: RIGHT/LEFT HEART CATH AND CORONARY ANGIOGRAPHY;  Surgeon: Belva Crome, MD;  Location: [place] [facility];  Service: Cardiovascular;  Laterality: N/A;  . VASECTOMY      Current Outpatient Medications  Medication Sig Dispense Refill  . amLODipine (NORVASC) 2.5 MG tablet Take 1 tablet (2.5 mg total) by mouth daily. 90 tablet 3  . aspirin EC 81 MG tablet Take 1 tablet (81 mg total) by mouth daily. 90 tablet 3  . furosemide (LASIX) 20 MG tablet Take 1 tablet (20 mg total) by mouth daily. 30 tablet 11  . icosapent Ethyl ([name]) 1 g capsule Take 2 g by mouth 2 (two) times daily.    Marland Kitchen icosapent Ethyl ([name]) 1 g capsule Take 1 g by mouth 2 (two) times daily.    Marland Kitchen levothyroxine (SYNTHROID) 175 MCG tablet Take 175 mcg by mouth daily before breakfast.    . nitroGLYCERIN (NITROSTAT) 0.4 MG SL tablet Place 1 tablet (0.4 mg total) under the tongue every 5 (five) minutes x 3 doses as needed for chest pain. 25 tablet 3  . omeprazole (PRILOSEC) 40 MG capsule Take 40 mg by mouth every morning.    . polyethylene glycol (MIRALAX) 17 g packet Take 17 g by  mouth daily. 14 each 0  . simvastatin (ZOCOR) 40 MG tablet Take 40 mg by mouth every evening.  6  . sucralfate (CARAFATE) 1 GM/10ML suspension Take 10 mLs (1 g total) by mouth every 6 (six) hours. 420 mL 1   No current facility-administered medications for this visit.   Allergies:  Penicillins and Lipitor [atorvastatin]   Social History: The patient  reports that he quit smoking about 27 years ago. He has a 40.00 pack-year smoking history. He has never used smokeless tobacco. He reports current alcohol use. He reports that he does not use drugs.   Family History: The patient's family history includes CAD in his father and paternal grandfather; Hypertension in his brother and mother; Peripheral vascular disease in his brother and father.  ROS:  Please see the history of present illness. Otherwise, complete review of systems is positive for none.  All other systems are reviewed and negative.   Physical Exam: VS:  BP 120/78   Pulse 82   Ht 5\' 9"  (1.753 m)   Wt 237 lb 5.4 oz (107.7 kg)   SpO2 98%   BMI 35.05 kg/m , BMI Body mass index is 35.05 kg/m.  Wt Readings from Last 3 Encounters:  01/07/20 237 lb 5.4 oz (107.7 kg)  01/01/20 237 lb (107.5 kg)  12/04/19 235 lb (106.6 kg)    General: Patient appears comfortable at rest. Neck: Supple, no elevated JVP or carotid bruits, no thyromegaly. Lungs: Clear to auscultation, nonlabored breathing at rest. Cardiac: Regular rate and rhythm, no S3 or significant systolic murmur, no pericardial rub. Extremities: No pitting edema, distal pulses 2+. Skin: Warm and dry. Musculoskeletal: No kyphosis. Neuropsychiatric: Alert and oriented x3, affect grossly appropriate.  ECG:  None today  Recent Labwork: 09/09/2019: ALT 57; AST 47 11/12/2019: B Natriuretic Peptide 18.4 11/13/2019: BUN 20; Creatinine, Ser 1.31; Hemoglobin 14.3; Platelets 208; Potassium 3.5; Sodium 133     Component Value Date/Time   CHOL 247 (H) 11/13/2019 0548   CHOL 138  09/09/2019 0809   TRIG 141 11/13/2019 0548   HDL 34 (L) 11/13/2019 0548   HDL 31 (L) 09/09/2019 0809   CHOLHDL 7.3 11/13/2019 0548   VLDL 28 11/13/2019 0548   LDLCALC 185 (H) 11/13/2019 0548   LDLCALC 75 09/09/2019 0809    Other Studies Reviewed Today:  Cardiac catheterization 11/13/2019  Normal right heart pressures including pulmonary wedge pressure. Normal mixed venous O2 saturation.  Widely patent coronary arteries with nonobstructive LAD and right coronary disease less than 50%. LAD does demonstrate slight reduction in TIMI flow which raises the question of microvascular disease.  Low normal LV systolic function. EF 50% with normal LVEDP.  RECOMMENDATIONS:   Discontinue nitrates. Consider calcium channel blocker therapy and or ARB therapy to manage potential microvascular obstructive disease.  Aggressive risk factor modification.  Long-term monitoring to rule out transient arrhythmia as the source of symptoms.    Echo 11/13/2019 IMPRESSIONS 1. Left ventricular ejection fraction, by visual estimation, is 60 to 65%. The left ventricle has normal function. There is mildly increased left ventricular hypertrophy. 2. Global right ventricle has normal systolic function.The right ventricular size is normal. 3. Left atrial size was normal. 4. Right atrial size was normal. 5. Mild mitral annular calcification. 6. The mitral valve is abnormal. No evidence of mitral valve regurgitation. 7. The aortic valve was not well visualized. Aortic valve regurgitation is not visualized. No evidence of aortic valve stenosis. 8. The pulmonic valve was not well visualized. Pulmonic valve regurgitation is not visualized. 9. The tricuspid valve is normal in structure. 10. TR signal is inadequate for assessing pulmonary artery systolic pressure.  Coronary CTA 03/2019 FINDINGS: FFRct analysis was performed on the original cardiac CT angiogram dataset. Diagrammatic representation of  the FFRct analysis is provided in a separate PDF document in PACS. This dictation was created using the PDF document and an interactive 3D model of the results. 3D model is not available in the EMR/PACS. Normal FFR range is >0.80.  1. Left Main: No significant stenosis. 2. LAD: No significant stenosis. 3. LCX: No significant stenosis. 4. RCA: No significant stenosis.  IMPRESSION: 1. CT FFR analysis didn't show any significant stenosis. Aggressive risk factor modification is recommended. FINDINGS: FFRct analysis was performed on the original cardiac CT  angiogram dataset. Diagrammatic representation of the FFRct analysis is provided in a separate PDF document in PACS. This dictation was created using the PDF document and an interactive 3D model of the results. 3D model is not available in the EMR/PACS. Normal FFR range is >0.80.  1. Left Main: No significant stenosis. 2. LAD: No significant stenosis. 3. LCX: No significant stenosis. 4. RCA: No significant stenosis.    Assessment and Plan:  1. Chest pain, unspecified type   2. CAD in native artery   3. Essential hypertension   4. Mixed hyperlipidemia   5. OSA on CPAP    1. Chest pain, unspecified type Continues to have episodic chest pain/chest pressure/epigastric pains which come without warning and are not associated with exertional activity. I am not convinced this is cardiac.  He denies any other associated radiation, nausea, vomiting, or diaphoresis.  He denies any sensation of palpitations or arrhythmias, or orthostatic symptoms, presyncope or syncopal episodes.  He did say on one occasion in the past when this occurred he did feel as though he may pass out but this has been quite some time ago.  Denies any other sensation of presyncope other than this 1 episode.  Continue nitroglycerin sublingual as needed for chest pain.  Continue omeprazole 40 mg daily.  Continue aspirin 81 mg daily. Overall he is feeling better  so will not make any changes today.   2. CAD in native artery Recent left and right heart cath showed only mild disease.  Cardiac catheterization on 11/13/2019 showed widely patent coronary arteries with nonobstructive LAD and right coronary disease less than 50%. LAD does demonstrate slight reduction in TIMI flow which raises the question of microvascular disease.  Patient was started on dihydropyridine CCB amlodipine 2.5 mg in the past for suspicion of possible vasospastic disease and microvascular disease.  3. Essential hypertension Blood pressure is doing well today.  Blood pressure 120/78.  Continue to monitor  4. Mixed hyperlipidemia LDL 185 on 11/23/2019 remains elevated in spite of being on simvastatin 40 mg.  Patient is seeing Dr. Debara Pickett soon for management of hyperlipidemia.  Informed patient of possible incidence myopathy from use of combination amlodipine and simvastatin at higher doses.  Patient understands but wants to continue the simvastatin at current dosage and total he has his lipid lab work today and he sees Dr. Debara Pickett in the future.  5. OSA on CPAP Patient brought with him today chip from his CPAP machine to give to the sleep department to determine whether he needs adjustment on his CPAP machine.  He asked that this information in the envelope he brought with him to be given to [place].  Disposition: F/u 6 months with Dr. Radford Pax  Medication Adjustments/Labs and Tests Ordered: Current medicines are reviewed at length with the patient today.  Concerns regarding medicines are outlined above.     Signed, Levell July, NP 01/07/2020 1:21 PM     [facility]

## 2020-01-07 ENCOUNTER — Ambulatory Visit: Payer: PPO | Admitting: Family Medicine

## 2020-01-07 ENCOUNTER — Other Ambulatory Visit: Payer: PPO

## 2020-01-07 ENCOUNTER — Other Ambulatory Visit: Payer: Self-pay

## 2020-01-07 ENCOUNTER — Encounter: Payer: Self-pay | Admitting: Family Medicine

## 2020-01-07 VITALS — BP 120/78 | HR 82 | Ht 69.0 in | Wt 237.3 lb

## 2020-01-07 DIAGNOSIS — I10 Essential (primary) hypertension: Secondary | ICD-10-CM | POA: Diagnosis not present

## 2020-01-07 DIAGNOSIS — E785 Hyperlipidemia, unspecified: Secondary | ICD-10-CM | POA: Diagnosis not present

## 2020-01-07 DIAGNOSIS — R079 Chest pain, unspecified: Secondary | ICD-10-CM

## 2020-01-07 DIAGNOSIS — Z9989 Dependence on other enabling machines and devices: Secondary | ICD-10-CM

## 2020-01-07 DIAGNOSIS — G4733 Obstructive sleep apnea (adult) (pediatric): Secondary | ICD-10-CM

## 2020-01-07 DIAGNOSIS — I2510 Atherosclerotic heart disease of native coronary artery without angina pectoris: Secondary | ICD-10-CM | POA: Diagnosis not present

## 2020-01-07 DIAGNOSIS — E782 Mixed hyperlipidemia: Secondary | ICD-10-CM

## 2020-01-07 LAB — LIPID PANEL
Chol/HDL Ratio: 3.8 ratio (ref 0.0–5.0)
Cholesterol, Total: 129 mg/dL (ref 100–199)
HDL: 34 mg/dL — ABNORMAL LOW (ref >39)
LDL Chol Calc (NIH): 76 mg/dL (ref 0–99)
Triglycerides: 100 mg/dL (ref 0–149)
VLDL Cholesterol Cal: 19 mg/dL (ref 5–40)

## 2020-01-07 LAB — ALT: ALT: 25 IU/L (ref 0–44)

## 2020-01-07 NOTE — Patient Instructions (Signed)
Medication Instructions:  Your physician recommends that you continue on your current medications as directed. Please refer to the Current Medication list given to you today.  *If you need a refill on your cardiac medications before your next appointment, please call your pharmacy*   Lab Work: None ordered today.  If you have labs (blood work) drawn today and your tests are completely normal, you will receive your results only by: Marland Kitchen MyChart Message (if you have MyChart) OR . A paper copy in the mail If you have any lab test that is abnormal or we need to change your treatment, we will call you to review the results.   Testing/Procedures: None ordered   Follow-Up: At [facility], you and your health needs are our priority.  As part of our continuing mission to provide you with exceptional heart care, we have created designated Provider Care Teams.  These Care Teams include your primary Cardiologist (physician) and Advanced Practice Providers (APPs -  Physician Assistants and Nurse Practitioners) who all work together to provide you with the care you need, when you need it.  We recommend signing up for the patient portal called "MyChart".  Sign up information is provided on this After Visit Summary.  MyChart is used to connect with patients for Virtual Visits (Telemedicine).  Patients are able to view lab/test results, encounter notes, upcoming appointments, etc.  Non-urgent messages can be sent to your provider as well.   To learn more about what you can do with MyChart, go to [email or web address].    Your next appointment:   6 month(s)  The format for your next appointment:   In Person  Provider:   You may see Fransico Him, MD or one of the following Advanced Practice Providers on your designated Care Team:    Melina Copa, PA-C  Ermalinda Barrios, PA-C    Other Instructions

## 2020-01-10 DIAGNOSIS — M5412 Radiculopathy, cervical region: Secondary | ICD-10-CM | POA: Diagnosis not present

## 2020-01-13 ENCOUNTER — Ambulatory Visit
Admission: RE | Admit: 2020-01-13 | Discharge: 2020-01-13 | Disposition: A | Discharge status: Home / Self Care | Payer: PPO | Source: Ambulatory Visit | Attending: Family Medicine | Admitting: Family Medicine

## 2020-01-13 ENCOUNTER — Other Ambulatory Visit: Payer: PPO

## 2020-01-13 DIAGNOSIS — R0789 Other chest pain: Secondary | ICD-10-CM

## 2020-01-13 DIAGNOSIS — K449 Diaphragmatic hernia without obstruction or gangrene: Secondary | ICD-10-CM | POA: Diagnosis not present

## 2020-01-13 DIAGNOSIS — K224 Dyskinesia of esophagus: Secondary | ICD-10-CM | POA: Diagnosis not present

## 2020-01-15 ENCOUNTER — Telehealth: Payer: Self-pay | Admitting: *Deleted

## 2020-01-15 DIAGNOSIS — Z9989 Dependence on other enabling machines and devices: Secondary | ICD-10-CM

## 2020-01-15 DIAGNOSIS — G4733 Obstructive sleep apnea (adult) (pediatric): Secondary | ICD-10-CM

## 2020-01-15 NOTE — Telephone Encounter (Signed)
-----   Message from Sueanne Margarita, MD sent at 01/13/2020  9:04 PM EST ----- AHI is too high - please change to auto CPAP from 4 to 18cm H2O and get a download in 2 weeks

## 2020-01-15 NOTE — Telephone Encounter (Signed)
Order placed to [employer] via community message. 

## 2020-01-17 ENCOUNTER — Ambulatory Visit: Payer: PPO | Admitting: Internal Medicine

## 2020-01-17 ENCOUNTER — Encounter: Payer: Self-pay | Admitting: Internal Medicine

## 2020-01-17 ENCOUNTER — Other Ambulatory Visit: Payer: Self-pay

## 2020-01-17 VITALS — BP 138/80 | HR 79 | Temp 97.3°F | Ht 70.0 in | Wt 237.0 lb

## 2020-01-17 DIAGNOSIS — E669 Obesity, unspecified: Secondary | ICD-10-CM | POA: Diagnosis not present

## 2020-01-17 DIAGNOSIS — I2510 Atherosclerotic heart disease of native coronary artery without angina pectoris: Secondary | ICD-10-CM | POA: Diagnosis not present

## 2020-01-17 DIAGNOSIS — G4733 Obstructive sleep apnea (adult) (pediatric): Secondary | ICD-10-CM

## 2020-01-17 DIAGNOSIS — I10 Essential (primary) hypertension: Secondary | ICD-10-CM

## 2020-01-17 DIAGNOSIS — E782 Mixed hyperlipidemia: Secondary | ICD-10-CM

## 2020-01-17 DIAGNOSIS — Z9989 Dependence on other enabling machines and devices: Secondary | ICD-10-CM | POA: Diagnosis not present

## 2020-01-17 NOTE — Patient Instructions (Signed)
Medication Instructions:  Your physician recommends that you continue on your current medications as directed. Please refer to the Current Medication list given to you today.  *If you need a refill on your cardiac medications before your next appointment, please call your pharmacy*  Follow-Up: At [facility], you and your health needs are our priority.  As part of our continuing mission to provide you with exceptional heart care, we have created designated Provider Care Teams.  These Care Teams include your primary Cardiologist (physician) and Advanced Practice Providers (APPs -  Physician Assistants and Nurse Practitioners) who all work together to provide you with the care you need, when you need it.  We recommend signing up for the patient portal called "MyChart".  Sign up information is provided on this After Visit Summary.  MyChart is used to connect with patients for Virtual Visits (Telemedicine).  Patients are able to view lab/test results, encounter notes, upcoming appointments, etc.  Non-urgent messages can be sent to your provider as well.   To learn more about what you can do with MyChart, go to [email or web address].    Your next appointment:   As needed with Dr. Debara Pickett in the lipid clinic

## 2020-01-17 NOTE — Progress Notes (Signed)
LIPID CLINIC CONSULT NOTE  Chief Complaint:  Manage dyslipidemia  Primary Care Physician: Harlan Stains, MD  Primary Cardiologist:  Fransico Him, MD  HPI:  Martin Hopkins is a 69 y.o. male who is being seen today for the evaluation of dyslipidemia at the request of Turner, Eber Hong, MD. This is a pleasant 69 year old male with history of hypertension, moderate obesity, OSA on CPAP and nonobstructive coronary disease by CTA with calcium score of 944 and moderate coronary disease of the proximal LAD proximal and mid circumflex and mild disease in the mid RCA.  Aggressive medical therapy was recommended.  He has struggled with dyslipidemia over the past year particularly with high triglycerides and LDL above goal of less than 70.  He has been maintained for some time on simvastatin 40mg  but in combination with low-dose amlodipine 2.5 mg.  We reviewed the data on this and the FDA warning about using amlodipine with simvastatin as it does potentiate the dose, effectively doubling the dose of simvastatin.  This could increase the risk of myalgias however he is asymptomatic with this.  Is not an absolute contraindication rather a recommendation and he could continue to use it off label if asymptomatic since he has been on this long-term prior to the FDA recommendations.  In addition he was started on [name] last summer.  He says he had difficulty taking 2 g twice daily because of intolerance and decreased it to 1 g twice daily.  He is maintained on that.  Most recently his lipids looked very well controlled with total cholesterol 129, triglycerides 100, HDL 34 and LDL of 76.  PMHx:  Past Medical History:  Diagnosis Date  . Adenomatous polyp    Dr Cristina Gong  . Allergic rhinitis   . ED (erectile dysfunction)   . Fatty liver   . Gastritis   . GERD (gastroesophageal reflux disease)    mild  . H/O: duodenal ulcer 1982  . Hypercholesteremia   . Hypertension   . Hypogonadism male   . Hypothyroidism    . Lumbar disc disease    L5--Dr Cay Schillings  . 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 Obesity (BMI 30-39.9)   . OSA (obstructive sleep apnea)    AHI 40/hr now on CPAP 9cm H2O    Past Surgical History:  Procedure Laterality Date  . colonscopy  10/2015   [facility]  . LUMBAR LAMINECTOMY  09/28/2011   Procedure: MICRODISCECTOMY LUMBAR LAMINECTOMY;  Surgeon: Tobi Bastos;  Location: [facility];  Service: Orthopedics;  Laterality: Left;  Hemi-Laminectomy, Microdiscectomy  Lumbar 5 - Sacral 1 Left    . NASAL FRACTURE SURGERY  10/1971  . NECK SURGERY  2014  . OTHER SURGICAL HISTORY  1972   nasal surgery   . OTHER SURGICAL HISTORY  2011   right trigger finger surgey   . right hand surgery     remove knots  . RIGHT/LEFT HEART CATH AND CORONARY ANGIOGRAPHY N/A 11/13/2019   Procedure: RIGHT/LEFT HEART CATH AND CORONARY ANGIOGRAPHY;  Surgeon: Belva Crome, MD;  Location: [place] [facility];  Service: Cardiovascular;  Laterality: N/A;  . VASECTOMY      FAMHx:  Family History  Problem Relation Age of Onset  . Hypertension Mother   . CAD Father   . Peripheral vascular disease Father   . Peripheral vascular disease Brother   . Hypertension Brother   . CAD Paternal Grandfather   . Colon cancer Neg Hx   . Liver cancer Neg  Hx   . Esophageal cancer Neg Hx   . Stomach cancer Neg Hx   . Rectal cancer Neg Hx     SOCHx:   reports that he quit smoking about 27 years ago. He has a 40.00 pack-year smoking history. He has never used smokeless tobacco. He reports current alcohol use. He reports that he does not use drugs.  ALLERGIES:  Allergies  Allergen Reactions  . Penicillins Shortness Of Breath  . Lipitor [Atorvastatin]     Joint pain     ROS: Pertinent items noted in HPI and remainder of comprehensive ROS otherwise negative.  HOME MEDS: Current Outpatient Medications on File Prior to Visit  Medication Sig Dispense Refill  . ALPRAZolam (XANAX) 0.25 MG tablet     . amLODipine (NORVASC) 2.5  MG tablet Take 1 tablet (2.5 mg total) by mouth daily. 90 tablet 3  . aspirin EC 81 MG tablet Take 1 tablet (81 mg total) by mouth daily. 90 tablet 3  . furosemide (LASIX) 20 MG tablet Take 1 tablet (20 mg total) by mouth daily. 30 tablet 11  . icosapent Ethyl ([name]) 1 g capsule Take 1 g by mouth 2 (two) times daily.    Marland Kitchen levothyroxine (SYNTHROID) 175 MCG tablet Take 175 mcg by mouth daily before breakfast.    . nitroGLYCERIN (NITROSTAT) 0.4 MG SL tablet Place 1 tablet (0.4 mg total) under the tongue every 5 (five) minutes x 3 doses as needed for chest pain. 25 tablet 3  . omeprazole (PRILOSEC) 40 MG capsule Take 40 mg by mouth every morning.    . polyethylene glycol (MIRALAX) 17 g packet Take 17 g by mouth daily. 14 each 0  . simvastatin (ZOCOR) 40 MG tablet Take 40 mg by mouth every evening.  6   No current facility-administered medications on file prior to visit.    LABS/IMAGING: No results found for this or any previous visit (from the past 48 hour(s)). No results found.  LIPID PANEL:    Component Value Date/Time   CHOL 129 01/07/2020 0858   TRIG 100 01/07/2020 0858   HDL 34 (L) 01/07/2020 0858   CHOLHDL 3.8 01/07/2020 0858   CHOLHDL 7.3 11/13/2019 0548   VLDL 28 11/13/2019 0548   LDLCALC 76 01/07/2020 0858    WEIGHTS: Wt Readings from Last 3 Encounters:  01/17/20 237 lb (107.5 kg)  01/07/20 237 lb 5.4 oz (107.7 kg)  01/01/20 237 lb (107.5 kg)    VITALS: BP 138/80   Pulse 79   Temp (!) 97.3 F (36.3 C)   Ht 5\' 10"  (1.778 m)   Wt 237 lb (107.5 kg)   SpO2 96%   BMI 34.01 kg/m   EXAM: Deferred  EKG: Deferred  ASSESSMENT: 1. Mixed dyslipidemia with high triglycerides, goal LDL less than 70 2. Mild to moderate nonobstructive coronary disease with CAC score of 944 3. Hypertension 4. Moderate obesity 5. OSA on CPAP  PLAN: 1.   Mr. Rothrock has a mixed dyslipidemia on been struggling with elevated triglycerides and high LDL.  He has made some dietary changes  over the past year which is predominantly responsible for his improvement in cholesterol.  He continues to take 40 mg simvastatin as well as gram twice daily [name] which is all he can tolerate.  Overall his cholesterol numbers look very good and I think with continued work on diet and more exercise over the next 6 months he will likely reach target.  No medication changes are recommended at this  time.  I think he should follow-up with his primary cardiologist.  Thanks again for the kind referral  Pixie Casino, MD, [place] Director of the [facility] Lipid Disorders &  Cardiovascular Risk Reduction Clinic Diplomate of the American Board of Clinical Lipidology Attending Cardiologist  Direct Dial: [phone]  Fax: [phone]  Website:  [email or web address] 01/17/2020, 8:33 AM

## 2020-01-23 DIAGNOSIS — M542 Cervicalgia: Secondary | ICD-10-CM | POA: Diagnosis not present

## 2020-01-23 DIAGNOSIS — M5412 Radiculopathy, cervical region: Secondary | ICD-10-CM | POA: Diagnosis not present

## 2020-01-30 DIAGNOSIS — Z6834 Body mass index (BMI) 34.0-34.9, adult: Secondary | ICD-10-CM | POA: Diagnosis not present

## 2020-01-30 DIAGNOSIS — R030 Elevated blood-pressure reading, without diagnosis of hypertension: Secondary | ICD-10-CM | POA: Diagnosis not present

## 2020-01-30 DIAGNOSIS — M5412 Radiculopathy, cervical region: Secondary | ICD-10-CM | POA: Diagnosis not present

## 2020-01-31 ENCOUNTER — Other Ambulatory Visit: Payer: Self-pay

## 2020-01-31 DIAGNOSIS — G4733 Obstructive sleep apnea (adult) (pediatric): Secondary | ICD-10-CM

## 2020-02-02 DIAGNOSIS — G4733 Obstructive sleep apnea (adult) (pediatric): Secondary | ICD-10-CM | POA: Diagnosis not present

## 2020-02-11 ENCOUNTER — Telehealth: Payer: Self-pay | Admitting: Pulmonary Disease

## 2020-02-11 DIAGNOSIS — G4733 Obstructive sleep apnea (adult) (pediatric): Secondary | ICD-10-CM

## 2020-02-11 NOTE — Telephone Encounter (Signed)
Called and spoke with Patient.  Dr. Judson Roch results and recommendations given. Patient aware of 3 month follow up with Dr. Ander Slade or NP for insurance compliance. DME order placed.  Nothing further at this time.    Dr. Ander Slade has reviewed the home sleep test this showed severe obstructive sleep apnea.   Recommendations   Treatment options are CPAP with the settings auto 5 to 15.    Weight loss measures .   Advise against driving while sleepy & against medication with sedative side effects.    Make appointment for 3 months for compliance with download with Dr. Ander Slade.

## 2020-02-17 DIAGNOSIS — M25511 Pain in right shoulder: Secondary | ICD-10-CM | POA: Diagnosis not present

## 2020-02-17 DIAGNOSIS — M542 Cervicalgia: Secondary | ICD-10-CM | POA: Diagnosis not present

## 2020-02-25 DIAGNOSIS — M542 Cervicalgia: Secondary | ICD-10-CM | POA: Diagnosis not present

## 2020-02-25 DIAGNOSIS — M25511 Pain in right shoulder: Secondary | ICD-10-CM | POA: Diagnosis not present

## 2020-03-05 ENCOUNTER — Telehealth: Payer: Self-pay | Admitting: Pulmonary Disease

## 2020-03-05 NOTE — Telephone Encounter (Signed)
The patient will need to speak w/ [employer] for this information & can call [phone] for this.

## 2020-03-05 NOTE — Telephone Encounter (Signed)
Spoke with pt. He has been given the information to contact [employer].

## 2020-03-06 DIAGNOSIS — G4733 Obstructive sleep apnea (adult) (pediatric): Secondary | ICD-10-CM | POA: Diagnosis not present

## 2020-03-12 DIAGNOSIS — M5412 Radiculopathy, cervical region: Secondary | ICD-10-CM | POA: Diagnosis not present

## 2020-04-02 ENCOUNTER — Telehealth: Payer: Self-pay | Admitting: *Deleted

## 2020-04-02 DIAGNOSIS — Z6825 Body mass index (BMI) 25.0-25.9, adult: Secondary | ICD-10-CM | POA: Diagnosis not present

## 2020-04-02 DIAGNOSIS — M5412 Radiculopathy, cervical region: Secondary | ICD-10-CM | POA: Diagnosis not present

## 2020-04-02 DIAGNOSIS — R030 Elevated blood-pressure reading, without diagnosis of hypertension: Secondary | ICD-10-CM | POA: Diagnosis not present

## 2020-04-02 NOTE — Telephone Encounter (Signed)
Ok to hold ASA for procedure 

## 2020-04-02 NOTE — Telephone Encounter (Signed)
   [facility] Pre-operative Risk Assessment    HEARTCARE STAFF: - Please ensure there is not already an duplicate clearance open for this procedure. - Under Visit Info/Reason for Call, type in Other and utilize the format Clearance MM/DD/YY or Clearance TBD. Do not use dashes or single digits. - If request is for dental extraction, please clarify the # of teeth to be extracted.  Request for surgical clearance:  1. What type of surgery is being performed? CERVICAL EPIDURAL STEROID INJECTION   2. When is this surgery scheduled? TBD   3. What type of clearance is required (medical clearance vs. Pharmacy clearance to hold med vs. Both)? MEDICAL  4. Are there any medications that need to be held prior to surgery and how long? ASA x 7 DAYS PRIOR TO INJECTION   5. Practice name and name of physician performing surgery? [place]; DR. Gwenlyn Perking BARTKO   6. What is the office phone number? [phone]   7.   What is the office fax number? [phone]  8.   Anesthesia type (None, local, MAC, general) ? NONE LISTED   Julaine Hua 04/02/2020, 2:02 PM  _________________________________________________________________   (provider comments below)

## 2020-04-03 NOTE — Telephone Encounter (Signed)
   Primary Cardiologist: Fransico Him, MD  Chart reviewed as part of pre-operative protocol coverage. Patient was contacted 04/03/2020 in reference to pre-operative risk assessment for pending surgery as outlined below.  Martin Hopkins was last seen on 01/17/20 by Dr. Debara Pickett. Since that day, Martin Hopkins has done well. He is limited in activity by mostly knee pain though can easily complete 4 METs without anginal complaints  Therefore, based on ACC/AHA guidelines, the patient would be at acceptable risk for the planned procedure without further cardiovascular testing.   Per Dr. Radford Pax, patient can hold aspirin 7 days proir to his upcoming spinal injection with plans to restart when cleared to do so by Dr. Brien Few.  I will route this recommendation to the requesting party via Epic fax function and remove from pre-op pool. Please call with questions.  Martin Butts, PA-C 04/03/2020, 1:34 PM

## 2020-04-03 NOTE — Telephone Encounter (Signed)
   Primary Cardiologist: Fransico Him, MD  Left a voicemail for patient to call back for ongoing preop assessment.   Abigail Butts, PA-C 04/03/2020, 10:27 AM

## 2020-04-03 NOTE — Telephone Encounter (Signed)
Patient is returning Krista's call for pre op assessment.

## 2020-04-05 DIAGNOSIS — G4733 Obstructive sleep apnea (adult) (pediatric): Secondary | ICD-10-CM | POA: Diagnosis not present

## 2020-04-08 ENCOUNTER — Other Ambulatory Visit: Payer: Self-pay | Admitting: Cardiology

## 2020-04-18 DIAGNOSIS — S60450A Superficial foreign body of right index finger, initial encounter: Secondary | ICD-10-CM | POA: Diagnosis not present

## 2020-04-22 DIAGNOSIS — M5412 Radiculopathy, cervical region: Secondary | ICD-10-CM | POA: Diagnosis not present

## 2020-04-23 IMAGING — CT CT HEAR MORPH WITH CTA COR WITH SCORE WITH CA WITH CONTRAST AND
4 of 7 series · 8 of 20 positions shown, 9 images · non-contrast
Comparison: None.
COMPARISON: None.

Addendum:
EXAM:
OVER-READ INTERPRETATION  CT CHEST

The following report is an over-read performed by radiologist Dr.
Mubashir Ibanez [REDACTED] on 03/19/2019. This
over-read does not include interpretation of cardiac or coronary
anatomy or pathology. The coronary calcium score/coronary CTA
interpretation by the cardiologist is attached.
CLINICAL DATA: 67-year-old male with hypertension, obesity and
chest pain.
Cardiac/Coronary  CT
TECHNIQUE: The patient was scanned on a Phillips Force scanner.

[Series 7: best diast 76 % · axial · 0.39mm/px · z∈[+1065,+1110]mm · 2 of 336 slices shown, 3 images]
[im 112/336  vessel]
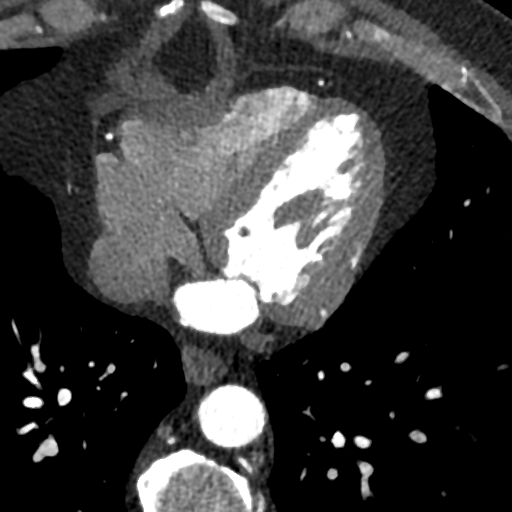
[im 112/336  lung]
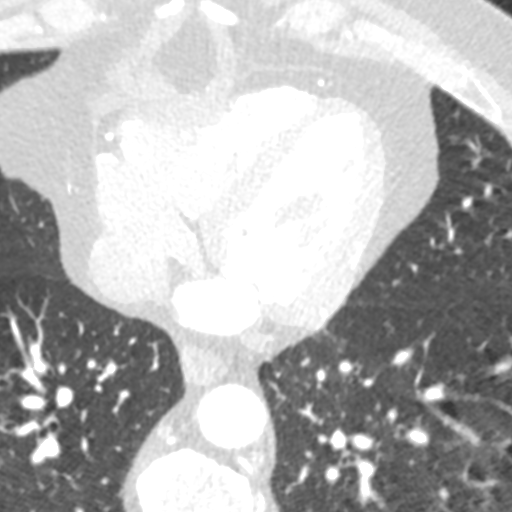
[im 224/336  vessel]
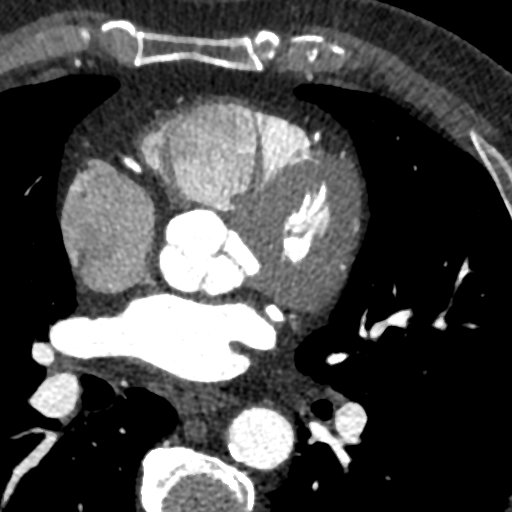

[Series 8: best syst 37 % · axial · 0.39mm/px · z∈[+1065,+1110]mm · 2 of 336 slices shown]
[im 112/336  vessel]
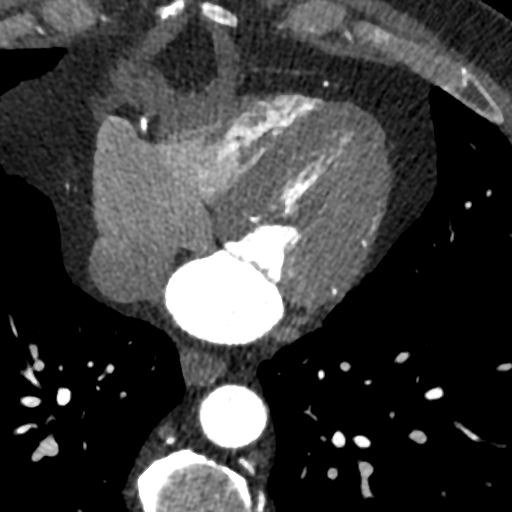
[im 224/336  vessel]
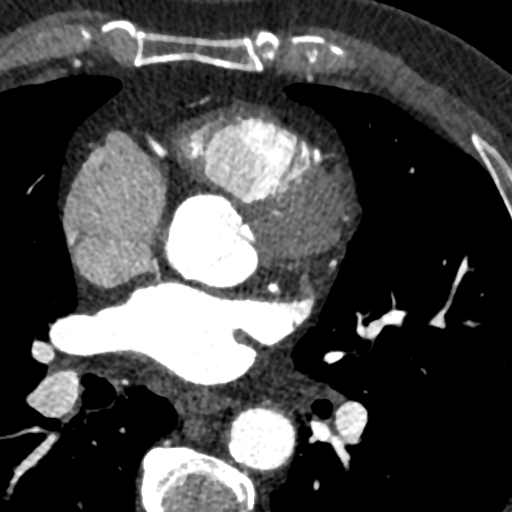

[Series 9: ts diast sharp 76 % · axial · 0.39mm/px · z∈[+1065,+1110]mm · 2 of 336 slices shown]
[im 112/336  lung]
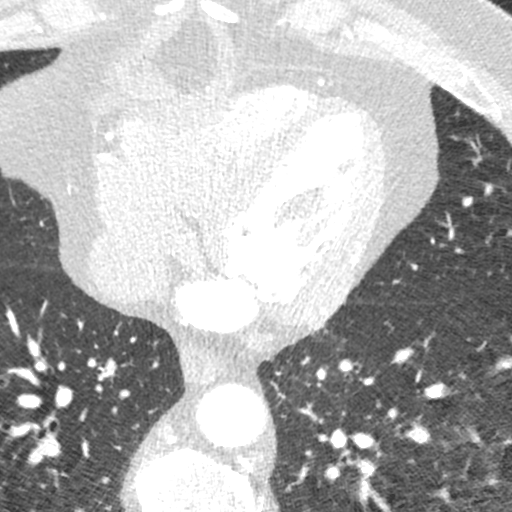
[im 224/336  lung]
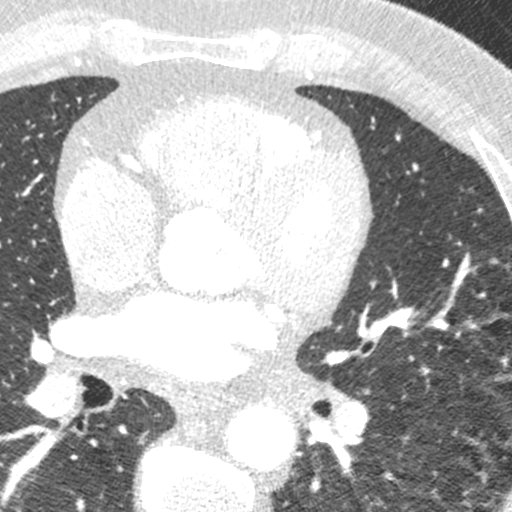

[Series 10: ts syst sharp 37 % · axial · 0.39mm/px · z∈[+1065,+1110]mm · 2 of 336 slices shown]
[im 112/336  lung]
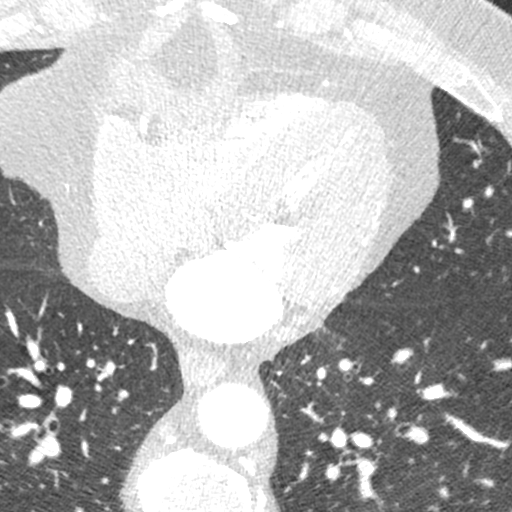
[im 224/336  lung]
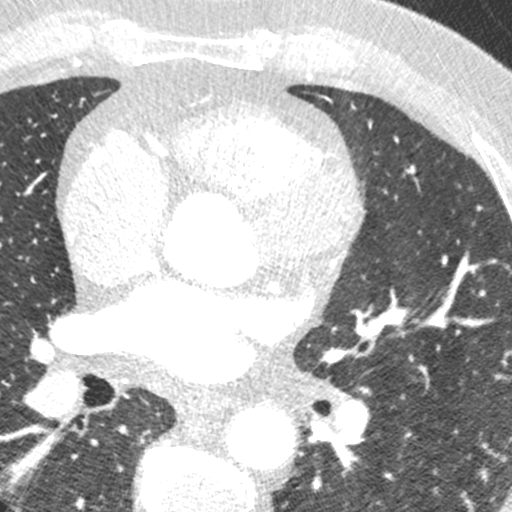

[8 of 20 positions shown; findings below may reference images not displayed]

FINDINGS: Limited view of the lung parenchyma demonstrates no suspicious
nodularity. Airways are normal.

Limited view of the mediastinum demonstrates no adenopathy.
Esophagus normal.

Limited view of the upper abdomen unremarkable.

Limited view of the skeleton and chest wall is unremarkable.
IMPRESSION: No significant extracardiac findings.
FINDINGS: A 120 kV prospective scan was triggered in the descending thoracic
aorta at 111 HU's. Axial non-contrast 3 mm slices were carried out
through the heart. The data set was analyzed on a dedicated work
station and scored using the Agatson method. Gantry rotation speed
was 250 msecs and collimation was .6 mm. No beta blockade and 0.8 mg
of sl NTG was given. The 3D data set was reconstructed in 5%
intervals of the 67-82 % of the R-R cycle. Diastolic phases were
analyzed on a dedicated work station using MPR, MIP and VRT modes.
The patient received 80 cc of contrast.

Aorta: Normal size. Mild diffuse atherosclerotic disease and
calcifications. No dissection.

Aortic Valve:  Trileaflet.  Trivial calcifications.

Coronary Arteries:  Normal coronary origin.  Right dominance.

RCA is a large dominant artery that gives rise to PDA and PLA. There
is diffuse mild calcified plaque in the proximal and mid RCA with
maximum stenosis 25-50%. Distal RCA, PDA/PLA have only minimal
plaque.

Left main is a large and long artery that gives rise to LAD and LCX
arteries. Left main has only minimal calcified plaque with stenosis
0-25%.

LAD is a large vessel that has two diagonal arteries. Proximal to
mid LAD has moderate diffuse plaque with stenosis 50-69%. Mid and
distal LAD has only minimal irregularities.

D1,2 are small lumen arteries with mild diffuse plaque and moderate
plaque in the ostial portion of D2 with stenosis 50-69%.

LCX is a large non-dominant artery that gives rise to one medium
size OM1 branch. There is moderate diffuse predominantly calcified
plaque in the proximal and mid LCX artery with maximum stenosis
50-69%.

OM1 has only minimal irregularities

Other findings:

Normal pulmonary vein drainage into the left atrium.

Normal let atrial appendage without a thrombus.

Mildly dilated pulmonary artery measuring 32 mm.
IMPRESSION: 1. Coronary calcium score of 944. This was 91 percentile for age and
sex matched control.

2. Normal coronary origin with right dominance.

3. Moderate CAD in the proximal LAD, proximal and mid LCX arteries,
mild CAD in the proximal and mid RCA. Additional analysis with CT
FFR will be submitted.

*** End of Addendum ***
EXAM:
OVER-READ INTERPRETATION  CT CHEST

The following report is an over-read performed by radiologist Dr.
Mubashir Ibanez [REDACTED] on 03/19/2019. This
over-read does not include interpretation of cardiac or coronary
anatomy or pathology. The coronary calcium score/coronary CTA
interpretation by the cardiologist is attached.
FINDINGS: Limited view of the lung parenchyma demonstrates no suspicious
nodularity. Airways are normal.

Limited view of the mediastinum demonstrates no adenopathy.
Esophagus normal.

Limited view of the upper abdomen unremarkable.

Limited view of the skeleton and chest wall is unremarkable.
IMPRESSION: No significant extracardiac findings.

## 2020-05-01 DIAGNOSIS — I10 Essential (primary) hypertension: Secondary | ICD-10-CM | POA: Diagnosis not present

## 2020-05-01 DIAGNOSIS — E039 Hypothyroidism, unspecified: Secondary | ICD-10-CM | POA: Diagnosis not present

## 2020-05-01 DIAGNOSIS — E785 Hyperlipidemia, unspecified: Secondary | ICD-10-CM | POA: Diagnosis not present

## 2020-05-04 DIAGNOSIS — M180 Bilateral primary osteoarthritis of first carpometacarpal joints: Secondary | ICD-10-CM | POA: Diagnosis not present

## 2020-05-04 DIAGNOSIS — M19041 Primary osteoarthritis, right hand: Secondary | ICD-10-CM | POA: Diagnosis not present

## 2020-05-04 DIAGNOSIS — R200 Anesthesia of skin: Secondary | ICD-10-CM | POA: Diagnosis not present

## 2020-05-04 DIAGNOSIS — M19042 Primary osteoarthritis, left hand: Secondary | ICD-10-CM | POA: Diagnosis not present

## 2020-05-04 DIAGNOSIS — R52 Pain, unspecified: Secondary | ICD-10-CM | POA: Diagnosis not present

## 2020-05-06 DIAGNOSIS — G4733 Obstructive sleep apnea (adult) (pediatric): Secondary | ICD-10-CM | POA: Diagnosis not present

## 2020-05-08 DIAGNOSIS — Z6835 Body mass index (BMI) 35.0-35.9, adult: Secondary | ICD-10-CM | POA: Diagnosis not present

## 2020-05-08 DIAGNOSIS — R030 Elevated blood-pressure reading, without diagnosis of hypertension: Secondary | ICD-10-CM | POA: Diagnosis not present

## 2020-05-08 DIAGNOSIS — M5412 Radiculopathy, cervical region: Secondary | ICD-10-CM | POA: Diagnosis not present

## 2020-05-18 DIAGNOSIS — G8929 Other chronic pain: Secondary | ICD-10-CM | POA: Diagnosis not present

## 2020-05-18 DIAGNOSIS — M79645 Pain in left finger(s): Secondary | ICD-10-CM | POA: Diagnosis not present

## 2020-05-18 DIAGNOSIS — M180 Bilateral primary osteoarthritis of first carpometacarpal joints: Secondary | ICD-10-CM | POA: Diagnosis not present

## 2020-05-18 DIAGNOSIS — M79644 Pain in right finger(s): Secondary | ICD-10-CM | POA: Diagnosis not present

## 2020-05-28 DIAGNOSIS — M5412 Radiculopathy, cervical region: Secondary | ICD-10-CM | POA: Diagnosis not present

## 2020-06-04 DIAGNOSIS — I10 Essential (primary) hypertension: Secondary | ICD-10-CM | POA: Diagnosis not present

## 2020-06-04 DIAGNOSIS — E039 Hypothyroidism, unspecified: Secondary | ICD-10-CM | POA: Diagnosis not present

## 2020-06-04 DIAGNOSIS — M5030 Other cervical disc degeneration, unspecified cervical region: Secondary | ICD-10-CM | POA: Diagnosis not present

## 2020-06-04 DIAGNOSIS — K219 Gastro-esophageal reflux disease without esophagitis: Secondary | ICD-10-CM | POA: Diagnosis not present

## 2020-06-04 DIAGNOSIS — E785 Hyperlipidemia, unspecified: Secondary | ICD-10-CM | POA: Diagnosis not present

## 2020-06-04 DIAGNOSIS — Z79899 Other long term (current) drug therapy: Secondary | ICD-10-CM | POA: Diagnosis not present

## 2020-06-04 DIAGNOSIS — G4730 Sleep apnea, unspecified: Secondary | ICD-10-CM | POA: Diagnosis not present

## 2020-06-05 DIAGNOSIS — G4733 Obstructive sleep apnea (adult) (pediatric): Secondary | ICD-10-CM | POA: Diagnosis not present

## 2020-07-03 DIAGNOSIS — I10 Essential (primary) hypertension: Secondary | ICD-10-CM | POA: Diagnosis not present

## 2020-07-03 DIAGNOSIS — E039 Hypothyroidism, unspecified: Secondary | ICD-10-CM | POA: Diagnosis not present

## 2020-07-03 DIAGNOSIS — E785 Hyperlipidemia, unspecified: Secondary | ICD-10-CM | POA: Diagnosis not present

## 2020-07-06 DIAGNOSIS — G4733 Obstructive sleep apnea (adult) (pediatric): Secondary | ICD-10-CM | POA: Diagnosis not present

## 2020-07-16 DIAGNOSIS — Z024 Encounter for examination for driving license: Secondary | ICD-10-CM | POA: Diagnosis not present

## 2020-07-16 DIAGNOSIS — Z20822 Contact with and (suspected) exposure to covid-19: Secondary | ICD-10-CM | POA: Diagnosis not present

## 2020-07-23 ENCOUNTER — Ambulatory Visit: Payer: PPO | Admitting: Cardiology

## 2020-08-05 ENCOUNTER — Telehealth: Payer: Self-pay | Admitting: Cardiology

## 2020-08-05 NOTE — Telephone Encounter (Signed)
Pt wants to know if he needs lab work done prior to his appt on 09/11/20 with Dr. Radford Pax.

## 2020-08-05 NOTE — Telephone Encounter (Signed)
Spoke with the patient who is wondering whether he needs lab work prior to seeing Dr. Radford Pax or if he can have it done on the same day. I advised him that it would be okay to have any lab work done on the same day that he sees Dr. Radford Pax so that she can determine exactly what lab work needs to be done. Patient verbalized understanding and will come to his appointment fasting in case lipids need to be checked.

## 2020-08-06 DIAGNOSIS — G4733 Obstructive sleep apnea (adult) (pediatric): Secondary | ICD-10-CM | POA: Diagnosis not present

## 2020-08-12 ENCOUNTER — Ambulatory Visit: Payer: PPO | Admitting: Cardiology

## 2020-08-12 DIAGNOSIS — Z20822 Contact with and (suspected) exposure to covid-19: Secondary | ICD-10-CM | POA: Diagnosis not present

## 2020-08-19 DIAGNOSIS — M180 Bilateral primary osteoarthritis of first carpometacarpal joints: Secondary | ICD-10-CM | POA: Diagnosis not present

## 2020-09-05 DIAGNOSIS — G4733 Obstructive sleep apnea (adult) (pediatric): Secondary | ICD-10-CM | POA: Diagnosis not present

## 2020-09-11 ENCOUNTER — Encounter: Payer: Self-pay | Admitting: Cardiology

## 2020-09-11 ENCOUNTER — Other Ambulatory Visit: Payer: Self-pay

## 2020-09-11 ENCOUNTER — Ambulatory Visit: Payer: PPO | Admitting: Cardiology

## 2020-09-11 VITALS — BP 130/70 | HR 82 | Ht 70.0 in | Wt 243.8 lb

## 2020-09-11 DIAGNOSIS — E782 Mixed hyperlipidemia: Secondary | ICD-10-CM

## 2020-09-11 DIAGNOSIS — E669 Obesity, unspecified: Secondary | ICD-10-CM | POA: Diagnosis not present

## 2020-09-11 DIAGNOSIS — Z9989 Dependence on other enabling machines and devices: Secondary | ICD-10-CM

## 2020-09-11 DIAGNOSIS — G4733 Obstructive sleep apnea (adult) (pediatric): Secondary | ICD-10-CM

## 2020-09-11 DIAGNOSIS — I2510 Atherosclerotic heart disease of native coronary artery without angina pectoris: Secondary | ICD-10-CM | POA: Diagnosis not present

## 2020-09-11 DIAGNOSIS — I10 Essential (primary) hypertension: Secondary | ICD-10-CM | POA: Diagnosis not present

## 2020-09-11 LAB — LIPID PANEL
Chol/HDL Ratio: 4.3 ratio (ref 0.0–5.0)
Cholesterol, Total: 155 mg/dL (ref 100–199)
HDL: 36 mg/dL — ABNORMAL LOW (ref >39)
LDL Chol Calc (NIH): 100 mg/dL — ABNORMAL HIGH (ref 0–99)
Triglycerides: 104 mg/dL (ref 0–149)
VLDL Cholesterol Cal: 19 mg/dL (ref 5–40)

## 2020-09-11 LAB — ALT: ALT: 68 IU/L — ABNORMAL HIGH (ref 0–44)

## 2020-09-11 NOTE — Progress Notes (Signed)
Cardiology Office Note:    Date:  09/11/2020   ID:  Martin Hopkins, DOB [birth year], MRN [id number]  PCP:  Harlan Stains, MD  Cardiologist:  Fransico Him, MD    Referring MD: Harlan Stains, MD   Chief Complaint  Patient presents with  . Coronary Artery Disease  . Hyperlipidemia  . Hypertension  . Sleep Apnea    History of Present Illness:    Martin Hopkins is a 69 y.o. male with a hx of OSA on CPAP, HTN, obesity and recent chest tightness.  Coronary CTA showed a calcium score of 944 with moderate CAD in the pLAD, prox and mid LCx and mild CAD in the prox and mid RCA.  FFR showed no flow limiting lesions.    Admitted to [facility] 11/09/2019 with SOB and right sided CP lasting several minutes.  He had awakened with profound SOB which his CPAP did not help him with and then developed the right sided CP lasted a few minutes.  The next night the same thing happened without the CP.  He also had had LE edema nd a 10lb weight gain over a few weeks.  He underwent right and left heart cath showing widely patent coronary arteries with nonobstructive LAD and RCA <50%.  There was slight reduction in TIMI flow in the LAD raising question of microvascular disease and EF was 50% with normal LVEDP. 2D echo showed normal LVF with EF 60-65%.  DDimer was negative and trop neg x 2.  He was started on IV lasix for mildly elevated BNP and LE edema. He was started on amlodpine 2.5mg  daily for possible microvascular angina.  Prior to discharge after cath he had recurrent SOB and chest CTA showed no PE.  He was sent home to followup with PCP to rule out noncardiac causes of SOB.  He also was discharged home on [name] and fenofibrate in addition to his statin but only started back on the statin.  The fenofibrate made his joints hurt.   He is here today for followup and is doing well.  he denies any chest pain or pressure, SOB, DOE, PND, orthopnea, LE edema, dizziness, palpitations or syncope. He is compliant with his meds and is  tolerating meds with no SE.  He has retired and exercises at the gym 3 times weekly.    He is doing well with his CPAP device and thinks that he has gotten used to it.  He tolerates the mask and feels the pressure is adequate.  Since going on CPAP he feels rested in the am and has no significant daytime sleepiness.  He denies any significant mouth or nasal dryness or nasal congestion.  He does not think that he snores.     Past Medical History:  Diagnosis Date  . Adenomatous polyp    Dr Cristina Gong  . Allergic rhinitis   . ED (erectile dysfunction)   . Fatty liver   . Gastritis   . GERD (gastroesophageal reflux disease)    mild  . H/O: duodenal ulcer 1982  . Hypercholesteremia   . Hypertension   . Hypogonadism male   . Hypothyroidism   . Lumbar disc disease    L5--Dr Cay Schillings  . 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 Obesity (BMI 30-39.9)   . OSA (obstructive sleep apnea)    AHI 40/hr now on CPAP 9cm H2O    Past Surgical History:  Procedure Laterality Date  . colonscopy  10/2015   [facility]  . LUMBAR LAMINECTOMY  09/28/2011   Procedure: MICRODISCECTOMY LUMBAR LAMINECTOMY;  Surgeon: Tobi Bastos;  Location: [facility];  Service: Orthopedics;  Laterality: Left;  Hemi-Laminectomy, Microdiscectomy  Lumbar 5 - Sacral 1 Left    . NASAL FRACTURE SURGERY  10/1971  . NECK SURGERY  2014  . OTHER SURGICAL HISTORY  1972   nasal surgery   . OTHER SURGICAL HISTORY  2011   right trigger finger surgey   . right hand surgery     remove knots  . RIGHT/LEFT HEART CATH AND CORONARY ANGIOGRAPHY N/A 11/13/2019   Procedure: RIGHT/LEFT HEART CATH AND CORONARY ANGIOGRAPHY;  Surgeon: Belva Crome, MD;  Location: [facility];  Service: Cardiovascular;  Laterality: N/A;  . VASECTOMY      Current Medications: Current Meds  Medication Sig  . ALPRAZolam (XANAX) 0.25 MG tablet   . amLODipine (NORVASC) 2.5 MG tablet Take 1 tablet (2.5 mg total) by mouth daily.  Marland Kitchen aspirin EC 81 MG tablet Take 1 tablet  (81 mg total) by mouth daily.  . furosemide (LASIX) 20 MG tablet Take 1 tablet (20 mg total) by mouth daily.  Marland Kitchen levothyroxine (SYNTHROID) 175 MCG tablet Take 175 mcg by mouth daily before breakfast.  . nitroGLYCERIN (NITROSTAT) 0.4 MG SL tablet Place 1 tablet (0.4 mg total) under the tongue every 5 (five) minutes x 3 doses as needed for chest pain.  Marland Kitchen omeprazole (PRILOSEC) 40 MG capsule Take 40 mg by mouth every morning.  . polyethylene glycol (MIRALAX) 17 g packet Take 17 g by mouth daily.  . simvastatin (ZOCOR) 40 MG tablet Take 40 mg by mouth every evening.  Marland Kitchen [name] 1 g capsule TAKE 2 CAPSULES BY MOUTH TWICE A DAY     Allergies:   Penicillins and Lipitor [atorvastatin]   Social History   Socioeconomic History  . Marital status: Married    Spouse name: Not on file  . Number of children: 3  . Years of education: Not on file  . Highest education level: Not on file  Occupational History  . Not on file  Tobacco Use  . Smoking status: Former Smoker    Packs/day: 2.00    Years: 20.00    Pack years: 40.00    Quit date: 11/07/1992    Years since quitting: 27.8  . Smokeless tobacco: Never Used  Vaping Use  . Vaping Use: Never used  Substance and Sexual Activity  . Alcohol use: Yes    Comment: rare  . Drug use: No  . Sexual activity: Not on file  Other Topics Concern  . Not on file  Social History Narrative  . Not on file   Social Determinants of Health   Financial Resource Strain:   . Difficulty of Paying Living Expenses: Not on file  Food Insecurity:   . Worried About Charity fundraiser in the Last Year: Not on file  . Ran Out of Food in the Last Year: Not on file  Transportation Needs:   . Lack of Transportation (Medical): Not on file  . Lack of Transportation (Non-Medical): Not on file  Physical Activity:   . Days of Exercise per Week: Not on file  . Minutes of Exercise per Session: Not on file  Stress:   . Feeling of Stress : Not on file  Social Connections:     . Frequency of Communication with Friends and Family: Not on file  . Frequency of Social Gatherings with Friends and Family: Not on file  . Attends Religious  Services: Not on file  . Active Member of Clubs or Organizations: Not on file  . Attends Archivist Meetings: Not on file  . Marital Status: Not on file     Family History: The patient's family history includes CAD in his father and paternal grandfather; Hypertension in his brother and mother; Peripheral vascular disease in his brother and father. There is no history of Colon cancer, Liver cancer, Esophageal cancer, Stomach cancer, or Rectal cancer.  ROS:   Please see the history of present illness.    Review of Systems  Musculoskeletal: Negative for muscle weakness.    All other systems reviewed and negative.   EKGs/Labs/Other Studies Reviewed:    The following studies were reviewed today: Cardiac cath, 2D echo, labs  EKG:  EKG is not ordered today.   Recent Labs: 11/12/2019: B Natriuretic Peptide 18.4 11/13/2019: BUN 20; Creatinine, Ser 1.31; Hemoglobin 14.3; Platelets 208; Potassium 3.5; Sodium 133 01/07/2020: ALT 25   Recent Lipid Panel    Component Value Date/Time   CHOL 129 01/07/2020 0858   TRIG 100 01/07/2020 0858   HDL 34 (L) 01/07/2020 0858   CHOLHDL 3.8 01/07/2020 0858   CHOLHDL 7.3 11/13/2019 0548   VLDL 28 11/13/2019 0548   LDLCALC 76 01/07/2020 0858    Physical Exam:    VS:  BP 130/70   Pulse 82   Ht 5\' 10"  (1.778 m)   Wt 243 lb 12.8 oz (110.6 kg)   SpO2 97%   BMI 34.98 kg/m     Wt Readings from Last 3 Encounters:  09/11/20 243 lb 12.8 oz (110.6 kg)  01/17/20 237 lb (107.5 kg)  01/07/20 237 lb 5.4 oz (107.7 kg)     GEN: Well nourished, well developed in no acute distress HEENT: Normal NECK: No JVD; No carotid bruits LYMPHATICS: No lymphadenopathy CARDIAC:RRR, no murmurs, rubs, gallops RESPIRATORY:  Clear to auscultation without rales, wheezing or rhonchi  ABDOMEN: Soft,  non-tender, non-distended MUSCULOSKELETAL:  No edema; No deformity  SKIN: Warm and dry NEUROLOGIC:  Alert and oriented x 3 PSYCHIATRIC:  Normal affect     ASSESSMENT:    1. OSA on CPAP   2. Essential hypertension, benign   3. Obesity (BMI 30-39.9)   4. CAD in native artery   5. Mixed hyperlipidemia    PLAN:    In order of problems listed above:  1. OSA - The patient is tolerating PAP therapy well without any problems. The PAP download was reviewed today and showed an AHI of 1.2/hr on auto PAP  with 100% compliance in using more than 4 hours nightly.  The patient has been using and benefiting from PAP use and will continue to benefit from therapy.   2.BP  -BP adequately controlled on exam today -continue amlodipine 2.5mg  daily  3.  Obesity -I have encouraged him to get into a routine exercise program and cut back on carbs and portions.   4.  ASCAD -nonobstructive by  cath in the LAD and RCA < 50% -he has not had any further anginal sx -continue ASA, amlodipine and statin  5.  HLD -LDL goal < 70 -LDL was 76 in March 2021 -repeat FLP and ALT -stopped fenofibrate -continue Simvastatin 40mg  daily and [name] 2gm BID   Medication Adjustments/Labs and Tests Ordered: Current medicines are reviewed at length with the patient today.  Concerns regarding medicines are outlined above.  No orders of the defined types were placed in this encounter.  No orders of the  defined types were placed in this encounter.   Signed, Fransico Him, MD  09/11/2020 9:27 AM    [place]

## 2020-09-11 NOTE — Patient Instructions (Signed)
Medication Instructions:  Your physician recommends that you continue on your current medications as directed. Please refer to the Current Medication list given to you today.  Labwork: You will have labs drawn today: FLP and ALT     Testing/Procedures: None ordered.  Follow-Up: Your physician recommends that you schedule a follow-up appointment in: 12 months with Dr. Radford Pax  Any Other Special Instructions Will Be Listed Below (If Applicable).     If you need a refill on your cardiac medications before your next appointment, please call your pharmacy.

## 2020-09-17 ENCOUNTER — Telehealth: Payer: Self-pay

## 2020-09-17 DIAGNOSIS — E782 Mixed hyperlipidemia: Secondary | ICD-10-CM

## 2020-09-17 MED ORDER — ROSUVASTATIN CALCIUM 20 MG PO TABS: 20.0000 mg | ORAL_TABLET | Freq: Every day | ORAL | 3 refills | Status: DC

## 2020-09-17 NOTE — Telephone Encounter (Signed)
-----   Message from Rollen Sox, [facility] sent at 09/17/2020  4:53 PM EST ----- Recommend switching from simvastatin 40mg  to rosuvastatin 20mg  and see if patient tolerates (has intolerance to atorvastatin listed).  If patient can not tolerate, recommend appointment with lipid clinic

## 2020-09-18 NOTE — Telephone Encounter (Signed)
Martin Margarita, MD  Rollen Sox, [facility]; Martin Iba, RN Agree - Frutoso Dimare please stop simvastatin and start Crestor 20mg  daily and FLP and ALT in 6 weeks

## 2020-09-18 NOTE — Addendum Note (Signed)
Addended by: Antonieta Iba on: 09/18/2020 07:44 AM   Modules accepted: Orders

## 2020-10-06 DIAGNOSIS — G4733 Obstructive sleep apnea (adult) (pediatric): Secondary | ICD-10-CM | POA: Diagnosis not present

## 2020-11-02 ENCOUNTER — Telehealth: Payer: Self-pay | Admitting: Cardiology

## 2020-11-02 DIAGNOSIS — E782 Mixed hyperlipidemia: Secondary | ICD-10-CM

## 2020-11-02 NOTE — Telephone Encounter (Signed)
Referral has been placed. 

## 2020-11-02 NOTE — Telephone Encounter (Signed)
Refer to lipid clinic 

## 2020-11-02 NOTE — Telephone Encounter (Signed)
New message:     Patient would like to speak with a nurse concerning rosuvastatin. Please call patient.

## 2020-11-02 NOTE — Telephone Encounter (Signed)
Patient states that he switched from simvastatin to rosuvastatin as recommended, however after about 2 weeks he was not able to tolerate it due to joint aches. He states that he switched back to simvastatin 40 mg about 2 weeks ago.

## 2020-11-04 ENCOUNTER — Other Ambulatory Visit: Payer: PPO

## 2020-11-05 DIAGNOSIS — G4733 Obstructive sleep apnea (adult) (pediatric): Secondary | ICD-10-CM | POA: Diagnosis not present

## 2020-11-24 ENCOUNTER — Ambulatory Visit: Payer: PPO

## 2020-11-25 DIAGNOSIS — E039 Hypothyroidism, unspecified: Secondary | ICD-10-CM | POA: Diagnosis not present

## 2020-11-25 DIAGNOSIS — I10 Essential (primary) hypertension: Secondary | ICD-10-CM | POA: Diagnosis not present

## 2020-11-25 DIAGNOSIS — K760 Fatty (change of) liver, not elsewhere classified: Secondary | ICD-10-CM | POA: Diagnosis not present

## 2020-11-25 DIAGNOSIS — Z125 Encounter for screening for malignant neoplasm of prostate: Secondary | ICD-10-CM | POA: Diagnosis not present

## 2020-11-25 DIAGNOSIS — Z0000 Encounter for general adult medical examination without abnormal findings: Secondary | ICD-10-CM | POA: Diagnosis not present

## 2020-11-25 DIAGNOSIS — R7303 Prediabetes: Secondary | ICD-10-CM | POA: Diagnosis not present

## 2020-11-25 DIAGNOSIS — G4730 Sleep apnea, unspecified: Secondary | ICD-10-CM | POA: Diagnosis not present

## 2020-11-25 DIAGNOSIS — Z23 Encounter for immunization: Secondary | ICD-10-CM | POA: Diagnosis not present

## 2020-11-25 DIAGNOSIS — E785 Hyperlipidemia, unspecified: Secondary | ICD-10-CM | POA: Diagnosis not present

## 2020-11-25 DIAGNOSIS — I700 Atherosclerosis of aorta: Secondary | ICD-10-CM | POA: Diagnosis not present

## 2020-11-25 DIAGNOSIS — F419 Anxiety disorder, unspecified: Secondary | ICD-10-CM | POA: Diagnosis not present

## 2020-11-25 LAB — LIPID PANEL
Cholesterol: 130 (ref 0–200)
HDL: 36 (ref 35–70)
LDL Cholesterol: 66
Triglycerides: 162 — AB (ref 40–160)

## 2020-11-25 LAB — COMPREHENSIVE METABOLIC PANEL
Albumin: 4.4 (ref 3.5–5.0)
Calcium: 9.6 (ref 8.7–10.7)
GFR calc Af Amer: 96
GFR calc non Af Amer: 79

## 2020-11-25 LAB — HEPATIC FUNCTION PANEL
ALT: 46 — AB (ref 10–40)
AST: 33 (ref 14–40)
Alkaline Phosphatase: 62 (ref 25–125)
Bilirubin, Total: 0.7

## 2020-11-25 LAB — BASIC METABOLIC PANEL
BUN: 12 (ref 4–21)
CO2: 31 — AB (ref 13–22)
Chloride: 102 (ref 99–108)
Creatinine: 1 (ref 0.6–1.3)
Glucose: 102
Potassium: 4.5 (ref 3.4–5.3)
Sodium: 139 (ref 137–147)

## 2020-11-25 LAB — HEMOGLOBIN A1C: Hemoglobin A1C: 5.7

## 2020-11-30 NOTE — Patient Instructions (Addendum)
It was good meeting you today!  We would like your LDL (bad cholesterol) to be less than 70 and your triglycerides to be less than 150  Please continue your rosuvastatin 20mg  once a day and increase your [name] to 1 gram once a day  Increase exercise to 30 minutes a day at least 5 days a week and watch alcohol intake  Try to increase lean proteins, vegetables, whole grains and limit processed foods  I will put in a lab order for a month to check your cholesterol  Karren Cobble, PharmD, BCACP, CDCES, [place] [address] [place], [place] [address] Phone: [phone]; Fax: [phone] 12/01/2020 10:57 AM

## 2020-12-01 ENCOUNTER — Encounter: Payer: Self-pay | Admitting: Pharmacist

## 2020-12-01 ENCOUNTER — Other Ambulatory Visit: Payer: Self-pay

## 2020-12-01 ENCOUNTER — Ambulatory Visit (INDEPENDENT_AMBULATORY_CARE_PROVIDER_SITE_OTHER): Payer: PPO | Admitting: Pharmacist

## 2020-12-01 DIAGNOSIS — E785 Hyperlipidemia, unspecified: Secondary | ICD-10-CM | POA: Insufficient documentation

## 2020-12-01 DIAGNOSIS — F419 Anxiety disorder, unspecified: Secondary | ICD-10-CM | POA: Insufficient documentation

## 2020-12-01 DIAGNOSIS — K219 Gastro-esophageal reflux disease without esophagitis: Secondary | ICD-10-CM | POA: Insufficient documentation

## 2020-12-01 DIAGNOSIS — I700 Atherosclerosis of aorta: Secondary | ICD-10-CM | POA: Insufficient documentation

## 2020-12-01 DIAGNOSIS — M5030 Other cervical disc degeneration, unspecified cervical region: Secondary | ICD-10-CM | POA: Insufficient documentation

## 2020-12-01 DIAGNOSIS — K760 Fatty (change of) liver, not elsewhere classified: Secondary | ICD-10-CM | POA: Insufficient documentation

## 2020-12-01 DIAGNOSIS — E039 Hypothyroidism, unspecified: Secondary | ICD-10-CM | POA: Insufficient documentation

## 2020-12-01 DIAGNOSIS — E291 Testicular hypofunction: Secondary | ICD-10-CM | POA: Insufficient documentation

## 2020-12-01 NOTE — Progress Notes (Signed)
Patient ID: Martin Hopkins                 DOB: 03-05-1951                    MRN: [id number]     HPI: Martin Hopkins is a 70 y.o. male patient referred to lipid clinic by Dr. Radford Pax. PMH is significant for hypertension, obesity, and OSA.  Coronary CTA showed a calcium score of 944 with moderate CAD in the pLAD, prox and mid LCx and mild CAD in the prox and mid RCA.  FFR showed no flow limiting lesions.  In [place] 2021, LDL was 76 while on simvastatin 40mg .  At visit with Dr Radford Pax, LDL increased to 100 on 09/11/20 and patient was switched from simvastatin to rosuvastatin.  Patient called on 12/27 and reported he was having muscle pains with rosuvastatin and switched back to simvastatin.  Patient reports today in good spirits. Decided to try rosuvastatin again and has been taking it for 2 weeks with no muscle pain. Had appointment with PCP on 1/19 and had repeat lab work.    Current labs: TC 130, HDL 36, LDL, 66, Trigs 162 (11/25/20 on rosuvastatin 20mg )  Patient reports he drinks alcohol, maybe a six beers a week.  Has a gym membership with his wife but has not been using it frequently.    Is prescribed [name] 2 grams BID (2 capsules twice daily) however has only been taking 1 capsule every other day because he does not like taking medication.    Current Medications: crestor 20 mg daily, [name] 1 gram every other day (patient knows he is not taking as directed) Intolerances: atorvastatin (myalgias) Risk Factors: HTN, CAD, elevated calcium score LDL goal: <70   Past Medical History:  Diagnosis Date  . Adenomatous polyp    Dr Cristina Gong  . Allergic rhinitis   . ED (erectile dysfunction)   . Fatty liver   . Gastritis   . GERD (gastroesophageal reflux disease)    mild  . H/O: duodenal ulcer 1982  . Hypercholesteremia   . Hypertension   . Hypogonadism male   . Hypothyroidism   . Lumbar disc disease    L5--Dr Cay Schillings  . 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 Obesity (BMI 30-39.9)   . OSA  (obstructive sleep apnea)    AHI 40/hr now on CPAP 9cm H2O    Current Outpatient Medications on File Prior to Visit  Medication Sig Dispense Refill  . ALPRAZolam (XANAX) 0.25 MG tablet     . amLODipine (NORVASC) 2.5 MG tablet Take 1 tablet (2.5 mg total) by mouth daily. 90 tablet 3  . aspirin EC 81 MG tablet Take 1 tablet (81 mg total) by mouth daily. 90 tablet 3  . furosemide (LASIX) 20 MG tablet Take 1 tablet (20 mg total) by mouth daily. 30 tablet 11  . levothyroxine (SYNTHROID) 175 MCG tablet Take 175 mcg by mouth daily before breakfast.    . nitroGLYCERIN (NITROSTAT) 0.4 MG SL tablet Place 1 tablet (0.4 mg total) under the tongue every 5 (five) minutes x 3 doses as needed for chest pain. 25 tablet 3  . omeprazole (PRILOSEC) 40 MG capsule Take 40 mg by mouth every morning.    . polyethylene glycol (MIRALAX) 17 g packet Take 17 g by mouth daily. 14 each 0  . rosuvastatin (CRESTOR) 20 MG tablet Take 1 tablet (20 mg total) by mouth daily. (Patient not taking: Reported on 11/02/2020) 90  tablet 3  . simvastatin (ZOCOR) 40 MG tablet Take 40 mg by mouth daily.    Marland Kitchen [name] 1 g capsule TAKE 2 CAPSULES BY MOUTH TWICE A DAY 360 capsule 3   No current facility-administered medications on file prior to visit.    Allergies  Allergen Reactions  . Penicillins Shortness Of Breath  . Lipitor [Atorvastatin]     Joint pain     Assessment/Plan:  1. Hyperlipidemia - Patient LDL 66 which is at goal of <70.  Triglycerides 162 which is above goal of <150 likely due to not taking [name] and alcohol consumption.  Recommended he increase his physical activity up to 30 minutes a day at least 5 days a week.  Recommend he increase his intake of vegetables, healthy fats, whole grains, and to limit processed foods.  Recommended patient take [name] as prescribed but patient was resistant.  Compromised and he will increase [name] to 1 capsule daily.    Since LDL at goal, no new medications needed at this  time.  Will recheck lipid panel in 1 month.  Advised patient to call if he develops muscle pain with rosuvastatin.  Can increase to 40 mg if LDL increases or consider addition of PCSK9.  Labs scheduled.  Recheck as needed.  Continue rosuvastatin 20mg  daily Begin [name] 1 gram daily Check lipid panel on 12/28/20 Recheck as needed  Karren Cobble, PharmD, BCACP, [place], [place] [address] [place], [place] [address] Phone: [phone]; Fax: [phone] 12/01/2020 2:42 PM

## 2020-12-06 DIAGNOSIS — G4733 Obstructive sleep apnea (adult) (pediatric): Secondary | ICD-10-CM | POA: Diagnosis not present

## 2020-12-10 ENCOUNTER — Encounter (INDEPENDENT_AMBULATORY_CARE_PROVIDER_SITE_OTHER): Payer: Self-pay | Admitting: Family Medicine

## 2020-12-10 ENCOUNTER — Other Ambulatory Visit: Payer: Self-pay

## 2020-12-10 ENCOUNTER — Ambulatory Visit (INDEPENDENT_AMBULATORY_CARE_PROVIDER_SITE_OTHER): Payer: PPO | Admitting: Family Medicine

## 2020-12-10 VITALS — BP 155/76 | HR 82 | Temp 98.5°F | Ht 69.0 in | Wt 244.0 lb

## 2020-12-10 DIAGNOSIS — F3289 Other specified depressive episodes: Secondary | ICD-10-CM | POA: Diagnosis not present

## 2020-12-10 DIAGNOSIS — R5383 Other fatigue: Secondary | ICD-10-CM

## 2020-12-10 DIAGNOSIS — E038 Other specified hypothyroidism: Secondary | ICD-10-CM | POA: Diagnosis not present

## 2020-12-10 DIAGNOSIS — M25562 Pain in left knee: Secondary | ICD-10-CM | POA: Diagnosis not present

## 2020-12-10 DIAGNOSIS — E559 Vitamin D deficiency, unspecified: Secondary | ICD-10-CM | POA: Diagnosis not present

## 2020-12-10 DIAGNOSIS — G4733 Obstructive sleep apnea (adult) (pediatric): Secondary | ICD-10-CM

## 2020-12-10 DIAGNOSIS — R0602 Shortness of breath: Secondary | ICD-10-CM | POA: Diagnosis not present

## 2020-12-10 DIAGNOSIS — I10 Essential (primary) hypertension: Secondary | ICD-10-CM | POA: Diagnosis not present

## 2020-12-10 DIAGNOSIS — K760 Fatty (change of) liver, not elsewhere classified: Secondary | ICD-10-CM | POA: Diagnosis not present

## 2020-12-10 DIAGNOSIS — Z6836 Body mass index (BMI) 36.0-36.9, adult: Secondary | ICD-10-CM | POA: Diagnosis not present

## 2020-12-10 DIAGNOSIS — I2510 Atherosclerotic heart disease of native coronary artery without angina pectoris: Secondary | ICD-10-CM | POA: Diagnosis not present

## 2020-12-10 DIAGNOSIS — Z0289 Encounter for other administrative examinations: Secondary | ICD-10-CM

## 2020-12-10 DIAGNOSIS — E7849 Other hyperlipidemia: Secondary | ICD-10-CM

## 2020-12-10 DIAGNOSIS — R7303 Prediabetes: Secondary | ICD-10-CM

## 2020-12-10 DIAGNOSIS — E65 Localized adiposity: Secondary | ICD-10-CM

## 2020-12-10 DIAGNOSIS — G8929 Other chronic pain: Secondary | ICD-10-CM

## 2020-12-10 DIAGNOSIS — Z9989 Dependence on other enabling machines and devices: Secondary | ICD-10-CM

## 2020-12-10 NOTE — Telephone Encounter (Signed)
Dr.Wallace °

## 2020-12-11 LAB — CBC WITH DIFFERENTIAL/PLATELET
Basophils Absolute: 0.1 10*3/uL (ref 0.0–0.2)
Basos: 1 %
EOS (ABSOLUTE): 0.2 10*3/uL (ref 0.0–0.4)
Eos: 3 %
Hematocrit: 45.2 % (ref 37.5–51.0)
Hemoglobin: 15.4 g/dL (ref 13.0–17.7)
Immature Grans (Abs): 0 10*3/uL (ref 0.0–0.1)
Immature Granulocytes: 0 %
Lymphocytes Absolute: 1.9 10*3/uL (ref 0.7–3.1)
Lymphs: 35 %
MCH: 31 pg (ref 26.6–33.0)
MCHC: 34.1 g/dL (ref 31.5–35.7)
MCV: 91 fL (ref 79–97)
Monocytes Absolute: 0.5 10*3/uL (ref 0.1–0.9)
Monocytes: 8 %
Neutrophils Absolute: 2.9 10*3/uL (ref 1.4–7.0)
Neutrophils: 53 %
Platelets: 176 10*3/uL (ref 150–450)
RBC: 4.97 x10E6/uL (ref 4.14–5.80)
RDW: 12.7 % (ref 11.6–15.4)
WBC: 5.6 10*3/uL (ref 3.4–10.8)

## 2020-12-11 LAB — VITAMIN B12: Vitamin B-12: 330 pg/mL (ref 232–1245)

## 2020-12-11 LAB — COMPREHENSIVE METABOLIC PANEL
ALT: 57 IU/L — ABNORMAL HIGH (ref 0–44)
AST: 48 IU/L — ABNORMAL HIGH (ref 0–40)
Albumin/Globulin Ratio: 1.2 (ref 1.2–2.2)
Albumin: 4.3 g/dL (ref 3.8–4.8)
Alkaline Phosphatase: 78 IU/L (ref 44–121)
BUN/Creatinine Ratio: 14 (ref 10–24)
BUN: 13 mg/dL (ref 8–27)
Bilirubin Total: 0.7 mg/dL (ref 0.0–1.2)
CO2: 22 mmol/L (ref 20–29)
Calcium: 9.4 mg/dL (ref 8.6–10.2)
Chloride: 100 mmol/L (ref 96–106)
Creatinine, Ser: 0.96 mg/dL (ref 0.76–1.27)
GFR calc Af Amer: 94 mL/min/{1.73_m2} (ref >59)
GFR calc non Af Amer: 81 mL/min/{1.73_m2} (ref >59)
Globulin, Total: 3.6 g/dL (ref 1.5–4.5)
Glucose: 102 mg/dL — ABNORMAL HIGH (ref 65–99)
Potassium: 4.3 mmol/L (ref 3.5–5.2)
Sodium: 138 mmol/L (ref 134–144)
Total Protein: 7.9 g/dL (ref 6.0–8.5)

## 2020-12-11 LAB — INSULIN, RANDOM: INSULIN: 27.8 u[IU]/mL — ABNORMAL HIGH (ref 2.6–24.9)

## 2020-12-11 LAB — TSH: TSH: 0.929 u[IU]/mL (ref 0.450–4.500)

## 2020-12-11 LAB — VITAMIN D 25 HYDROXY (VIT D DEFICIENCY, FRACTURES): Vit D, 25-Hydroxy: 16.2 ng/mL — ABNORMAL LOW (ref 30.0–100.0)

## 2020-12-11 LAB — HEMOGLOBIN A1C
Est. average glucose Bld gHb Est-mCnc: 126 mg/dL
Hgb A1c MFr Bld: 6 % — ABNORMAL HIGH (ref 4.8–5.6)

## 2020-12-11 LAB — T4, FREE: Free T4: 1.63 ng/dL (ref 0.82–1.77)

## 2020-12-15 NOTE — Progress Notes (Signed)
Dear Dr. Dema Severin,   Thank you for referring Martin Hopkins to our clinic. The following note includes my evaluation and treatment recommendations.  Chief Complaint:   OBESITY Martin Hopkins (MR# [id number]) is a 70 y.o. male who presents for evaluation and treatment of obesity and related comorbidities. Current BMI is Body mass index is 36.03 kg/m. Martin Hopkins has been struggling with his weight for many years and has been unsuccessful in either losing weight, maintaining weight loss, or reaching his healthy weight goal.  Martin Hopkins is currently in the action stage of change and ready to dedicate time achieving and maintaining a healthier weight. Martin Hopkins is interested in becoming our patient and working on intensive lifestyle modifications including (but not limited to) diet and exercise for weight loss.  Martin Hopkins likes to drink beer, but says he is willing to stop.  He likes accountability.  Martin Hopkins's habits were reviewed today and are as follows: His family eats meals together, he thinks his family will eat healthier with him, his desired weight loss is 46 pounds, he started gaining weight after age 23, his heaviest weight ever was 250 pounds, he craves steak, chicken, and all vegetables, he skips lunch frequently, he is frequently drinking liquids with calories, he frequently eats larger portions than normal and he struggles with emotional eating.  Depression Screen Martin Hopkins's Food and Mood (modified PHQ-9) score was 4.  Depression screen Martin Hopkins 2/9 12/10/2020  Decreased Interest 1  Down, Depressed, Hopeless 0  PHQ - 2 Score 1  Altered sleeping 1  Tired, decreased energy 1  Change in appetite 1  Feeling bad or failure about yourself  0  Trouble concentrating 0  Moving slowly or fidgety/restless 0  Suicidal thoughts 0  PHQ-9 Score 4  Difficult doing work/chores Not difficult at all   Assessment/Plan:   1. Other fatigue Martin Hopkins admits to daytime somnolence and denies waking up still tired. Patent has a history of symptoms  of daytime fatigue and snoring. Martin Hopkins generally gets 5 or 6 hours of sleep per night, and states that he has generally restful sleep. Snoring is present. Apneic episodes are present. Epworth Sleepiness Score is 10.  Martin Hopkins has OSA and uses a CPAP machine.  Martin Hopkins does feel that his weight is causing his energy to be lower than it should be. Fatigue may be related to obesity, depression or many other causes. Labs will be ordered, and in the meanwhile, Martin Hopkins will focus on self care including making healthy food choices, increasing physical activity and focusing on stress reduction.  - EKG 12-Lead - CBC with Differential/Platelet - Comprehensive metabolic panel - Vitamin O75 - TSH - T4, free  2. SOB (shortness of breath) on exertion Martin Hopkins notes increasing shortness of breath with exercising and seems to be worsening over time with weight gain. He notes getting out of breath sooner with activity than he used to. This has gotten worse recently. Martin Hopkins denies shortness of breath at rest or orthopnea.  Martin Hopkins does feel that he gets out of breath more easily that he used to when he exercises. Martin Hopkins's shortness of breath appears to be obesity related and exercise induced. He has agreed to work on weight loss and gradually increase exercise to treat his exercise induced shortness of breath. Will continue to monitor closely.  3. Visceral obesity Current visceral fat rating: 21. Visceral fat rating should be < 13. Visceral adipose tissue is a hormonally active component of total body fat. This body composition phenotype is associated  with medical disorders such as metabolic syndrome, cardiovascular disease and several malignancies including prostate, breast, and colorectal cancers. Starting goal: Lose 7-10% of starting weight.   4. Prediabetes Goal is HgbA1c < 5.7.  Medication: None.   Plan:  Will check insulin level and A1c today.  He will focus on protein-rich, low simple carbohydrate foods. We reviewed the importance of  hydration, regular exercise for stress reduction, and restorative sleep.   - Comprehensive metabolic panel - Insulin, random  5. Other specified hypothyroidism Medication: levothyroxine 175 mcg daily.   Plan:  Will check TSH and free T4 today.  Continue levothyroxine at current dose at this time.  Patient was instructed not to take MVM or iron within 4 hours of taking thyroid medications.  We will continue to monitor alongside Endocrinology/PCP as it relates to his weight loss journey.   - TSH - T4, free  6. Chronic pain of left knee Martin Hopkins has a torn meniscus in his left knee.  7. Essential hypertension Elevated today.  Medications: Norvasc 2.5 mg daily.   Plan: Avoid buying foods that are: processed, frozen, or prepackaged to avoid excess salt. We will continue to monitor closely alongside his PCP and/or Specialist.  Regular follow up with PCP and specialists was also encouraged.  Will check labs today.  BP Readings from Last 3 Encounters:  12/10/20 (!) 155/76  09/11/20 130/70  01/17/20 138/80   - CBC with Differential/Platelet - Comprehensive metabolic panel - TSH - T4, free  8. Other hyperlipidemia Lipid-lowering medications: [name] 1 g daily, Crestor 20 mg daily.   Plan: Dietary changes: Increase soluble fiber, decrease simple carbohydrates, decrease saturated fat. Exercise changes: Moderate to vigorous-intensity aerobic activity 150 minutes per week or as tolerated. We will continue to monitor along with PCP/specialists as it pertains to his weight loss journey.  Lab Results  Component Value Date   CHOL 155 09/11/2020   HDL 36 (L) 09/11/2020   LDLCALC 100 (H) 09/11/2020   TRIG 104 09/11/2020   CHOLHDL 4.3 09/11/2020   The 10-year ASCVD risk score [name] DC Jr., et al., 2013) is: 23.5%   Values used to calculate the score:     Age: 30 years     Sex: Male     Is Non-Hispanic African American: No     Diabetic: No     Tobacco smoker: No     Systolic Blood Pressure:  155 mmHg     Is BP treated: Yes     HDL Cholesterol: 36 mg/dL     Total Cholesterol: 155 mg/dL  9. Fatty liver NAFLD is an umbrella term that encompasses a disease spectrum that includes steatosis (fat) without inflammation, steatohepatitis (NASH; fat + inflammation in a characteristic pattern), and cirrhosis. Bland steatosis is felt to be a benign condition, with extremely low to no risk of progression to cirrhosis, whereas NASH can progress to cirrhosis. The mainstay of treatment of NAFLD includes lifestyle modification to achieve weight loss, at least 7% of current body weight. Low carbohydrate diets can be beneficial in improving NAFLD liver histology. Additionally, exercise, even the absence of weight loss can have beneficial effects on the patient's metabolic profile and liver health.   10. OSA on CPAP OSA is a cause of systemic hypertension and is associated with an increased incidence of stroke, heart failure, atrial fibrillation, and coronary heart disease. Severe OSA increases all-cause mortality and  cardiovascular mortality.  Epworth score is 10.  Jamiah uses a CPAP machine.  Goal: Treatment of  OSA via CPAP compliance and weight loss. . Plasma ghrelin levels (appetite or "hunger hormone") are significantly higher in OSA patients than in BMI-matched controls, but decrease to levels similar to those of obese patients without OSA after CPAP treatment.  . Weight loss improves OSA by several mechanisms, including reduction in fatty tissue in the throat (i.e. parapharyngeal fat) and the tongue. Loss of abdominal fat increases mediastinal traction on the upper airway making it less likely to collapse during sleep. . Studies have also shown that compliance with CPAP treatment improves leptin (hunger inhibitory hormone) imbalance.  11. Coronary artery disease involving native coronary artery of native heart Theoren is followed by Cardiology.  We will continue to monitor symptoms as they relate to his  weight loss journey.  12. Vitamin D deficiency Optimal goal > 50 ng/dL.   Plan:  Will check vitamin D level today, as per below.  - VITAMIN D 25 Hydroxy (Vit-D Deficiency, Fractures)  13. Other depression, with emotional eating Not at goal. Medication: None.  Barrington eats when he is stressed and bored.  He also tends to overeat.  Plan:  Behavior modification techniques were discussed today to help deal with emotional/non-hunger eating behaviors.  14. Class 2 severe obesity with serious comorbidity and body mass index (BMI) of 36.0 to 36.9 in adult, unspecified obesity type (HCC)  Banner is currently in the action stage of change and his goal is to continue with weight loss efforts. I recommend Maycen begin the structured treatment plan as follows:  He has agreed to the Category 4 Plan.  Exercise goals: No exercise has been prescribed at this time.   Behavioral modification strategies: increasing lean protein intake, decreasing simple carbohydrates, increasing vegetables, increasing water intake, decreasing liquid calories, decreasing alcohol intake, decreasing sodium intake and increasing high fiber foods.  He was informed of the importance of frequent follow-up visits to maximize his success with intensive lifestyle modifications for his multiple health conditions. He was informed we would discuss his lab results at his next visit unless there is a critical issue that needs to be addressed sooner. Autumn agreed to keep his next visit at the agreed upon time to discuss these results.  Objective:   Blood pressure (!) 155/76, pulse 82, temperature 98.5 F (36.9 C), temperature source Oral, height 5\' 9"  (1.753 m), weight 244 lb (110.7 kg), SpO2 96 %. Body mass index is 36.03 kg/m.  EKG: Normal sinus rhythm, rate 82 bpm.  Indirect Calorimeter completed today shows a VO2 of 332 and a REE of 2312.  His calculated basal metabolic rate is 0263 thus his basal metabolic rate is better than  expected.  General: Cooperative, alert, well developed, in no acute distress. HEENT: Conjunctivae and lids unremarkable. Cardiovascular: Regular rhythm.  Lungs: Normal work of breathing. Neurologic: No focal deficits.   Lab Results  Component Value Date   CREATININE 0.96 12/10/2020   BUN 13 12/10/2020   NA 138 12/10/2020   K 4.3 12/10/2020   CL 100 12/10/2020   CO2 22 12/10/2020   Lab Results  Component Value Date   ALT 57 (H) 12/10/2020   AST 48 (H) 12/10/2020   ALKPHOS 78 12/10/2020   BILITOT 0.7 12/10/2020   Lab Results  Component Value Date   HGBA1C 6.0 (H) 12/10/2020   Lab Results  Component Value Date   INSULIN 27.8 (H) 12/10/2020   Lab Results  Component Value Date   TSH 0.929 12/10/2020   Lab Results  Component Value Date  CHOL 155 09/11/2020   HDL 36 (L) 09/11/2020   LDLCALC 100 (H) 09/11/2020   TRIG 104 09/11/2020   CHOLHDL 4.3 09/11/2020   Lab Results  Component Value Date   WBC 5.6 12/10/2020   HGB 15.4 12/10/2020   HCT 45.2 12/10/2020   MCV 91 12/10/2020   PLT 176 12/10/2020   Lab Results  Component Value Date   IRON 103 11/27/2019   FERRITIN 515.7 (H) 11/27/2019   Obesity Behavioral Intervention:   Approximately 15 minutes were spent on the discussion below.  ASK: We discussed the diagnosis of obesity with [name] today and Joselito agreed to give [place] permission to discuss obesity behavioral modification therapy today.  ASSESS: Tremon has the diagnosis of obesity and his BMI today is 36.1. Delton is in the action stage of change.   ADVISE: Lido was educated on the multiple health risks of obesity as well as the benefit of weight loss to improve his health. He was advised of the need for long term treatment and the importance of lifestyle modifications to improve his current health and to decrease his risk of future health problems.  AGREE: Multiple dietary modification options and treatment options were discussed and Dwaine agreed to follow the  recommendations documented in the above note.  ARRANGE: Izyan was educated on the importance of frequent visits to treat obesity as outlined per CMS and USPSTF guidelines and agreed to schedule his next follow up appointment today.  Attestation Statements:   This is the patient's first visit at [facility]. The patient's NEW PATIENT PACKET was reviewed at length. Included in the packet: current and past health history, medications, allergies, ROS, gynecologic history (women only), surgical history, family history, social history, weight history, weight loss surgery history (for those that have had weight loss surgery), nutritional evaluation, mood and food questionnaire, PHQ9, Epworth questionnaire, sleep habits questionnaire, patient life and health improvement goals questionnaire. These will all be scanned into the patient's chart under media.   During the visit, I independently reviewed the patient's EKG, bioimpedance scale results, and indirect calorimeter results. I used this information to tailor a meal plan for the patient that will help him to lose weight and will improve his obesity-related conditions going forward. I performed a medically necessary appropriate examination and/or evaluation. I discussed the assessment and treatment plan with the patient. The patient was provided an opportunity to ask questions and all were answered. The patient agreed with the plan and demonstrated an understanding of the instructions. Labs were ordered at this visit and will be reviewed at the next visit unless more critical results need to be addressed immediately. Clinical information was updated and documented in the EMR.   I, Water quality scientist, CMA, am acting as transcriptionist for Briscoe Deutscher, DO  I have reviewed the above documentation for accuracy and completeness, and I agree with the above. Briscoe Deutscher, DO

## 2020-12-24 ENCOUNTER — Encounter (INDEPENDENT_AMBULATORY_CARE_PROVIDER_SITE_OTHER): Payer: Self-pay

## 2020-12-24 ENCOUNTER — Ambulatory Visit (INDEPENDENT_AMBULATORY_CARE_PROVIDER_SITE_OTHER): Payer: Self-pay | Admitting: Family Medicine

## 2020-12-28 ENCOUNTER — Other Ambulatory Visit: Payer: Self-pay

## 2020-12-28 ENCOUNTER — Other Ambulatory Visit: Payer: PPO | Admitting: *Deleted

## 2020-12-28 DIAGNOSIS — E782 Mixed hyperlipidemia: Secondary | ICD-10-CM | POA: Diagnosis not present

## 2020-12-28 LAB — LIPID PANEL
Chol/HDL Ratio: 2.9 ratio (ref 0.0–5.0)
Cholesterol, Total: 109 mg/dL (ref 100–199)
HDL: 37 mg/dL — ABNORMAL LOW (ref >39)
LDL Chol Calc (NIH): 56 mg/dL (ref 0–99)
Triglycerides: 78 mg/dL (ref 0–149)
VLDL Cholesterol Cal: 16 mg/dL (ref 5–40)

## 2020-12-28 LAB — ALT: ALT: 44 IU/L (ref 0–44)

## 2021-01-04 DIAGNOSIS — G4733 Obstructive sleep apnea (adult) (pediatric): Secondary | ICD-10-CM | POA: Diagnosis not present

## 2021-01-06 ENCOUNTER — Encounter (INDEPENDENT_AMBULATORY_CARE_PROVIDER_SITE_OTHER): Payer: Self-pay

## 2021-01-06 ENCOUNTER — Ambulatory Visit (INDEPENDENT_AMBULATORY_CARE_PROVIDER_SITE_OTHER): Payer: PPO | Admitting: Adult Health

## 2021-01-06 ENCOUNTER — Ambulatory Visit (INDEPENDENT_AMBULATORY_CARE_PROVIDER_SITE_OTHER): Payer: Self-pay | Admitting: Family Medicine

## 2021-01-13 ENCOUNTER — Ambulatory Visit (INDEPENDENT_AMBULATORY_CARE_PROVIDER_SITE_OTHER): Payer: PPO | Admitting: Family Medicine

## 2021-01-27 ENCOUNTER — Other Ambulatory Visit (INDEPENDENT_AMBULATORY_CARE_PROVIDER_SITE_OTHER): Payer: Self-pay | Admitting: Family Medicine

## 2021-02-02 DIAGNOSIS — E785 Hyperlipidemia, unspecified: Secondary | ICD-10-CM | POA: Diagnosis not present

## 2021-02-02 DIAGNOSIS — I10 Essential (primary) hypertension: Secondary | ICD-10-CM | POA: Diagnosis not present

## 2021-02-02 DIAGNOSIS — K219 Gastro-esophageal reflux disease without esophagitis: Secondary | ICD-10-CM | POA: Diagnosis not present

## 2021-02-02 DIAGNOSIS — E039 Hypothyroidism, unspecified: Secondary | ICD-10-CM | POA: Diagnosis not present

## 2021-02-03 DIAGNOSIS — G4733 Obstructive sleep apnea (adult) (pediatric): Secondary | ICD-10-CM | POA: Diagnosis not present

## 2021-02-05 DIAGNOSIS — M180 Bilateral primary osteoarthritis of first carpometacarpal joints: Secondary | ICD-10-CM | POA: Diagnosis not present

## 2021-02-11 ENCOUNTER — Encounter (INDEPENDENT_AMBULATORY_CARE_PROVIDER_SITE_OTHER): Payer: Self-pay | Admitting: Family Medicine

## 2021-02-11 ENCOUNTER — Ambulatory Visit (INDEPENDENT_AMBULATORY_CARE_PROVIDER_SITE_OTHER): Payer: PPO | Admitting: Family Medicine

## 2021-02-11 ENCOUNTER — Other Ambulatory Visit: Payer: Self-pay

## 2021-02-11 VITALS — BP 131/66 | HR 78 | Temp 98.4°F | Ht 69.0 in | Wt 230.0 lb

## 2021-02-11 DIAGNOSIS — R7303 Prediabetes: Secondary | ICD-10-CM

## 2021-02-11 DIAGNOSIS — E559 Vitamin D deficiency, unspecified: Secondary | ICD-10-CM

## 2021-02-11 DIAGNOSIS — I10 Essential (primary) hypertension: Secondary | ICD-10-CM | POA: Diagnosis not present

## 2021-02-11 DIAGNOSIS — Z6836 Body mass index (BMI) 36.0-36.9, adult: Secondary | ICD-10-CM

## 2021-02-11 DIAGNOSIS — E7849 Other hyperlipidemia: Secondary | ICD-10-CM | POA: Diagnosis not present

## 2021-02-11 DIAGNOSIS — R7401 Elevation of levels of liver transaminase levels: Secondary | ICD-10-CM | POA: Diagnosis not present

## 2021-02-17 NOTE — Progress Notes (Signed)
Chief Complaint:   OBESITY Martin Hopkins is here to discuss his progress with his obesity treatment plan along with follow-up of his obesity related diagnoses.   Today's visit was #: 2 Starting weight: 244 lbs Starting date: 12/10/2020 Today's weight: 230 lbs Today's date: 02/11/2021 Total lbs lost to date: 14 lbs Body mass index is 33.97 kg/m.  Total weight loss percentage to date: -5.74%  Interim History: [name] reports walking 11,000 steps per day. He is in range for his calories and protein. He has stopped drinking alcohol. He also says his son is losing weight. Current Meal Plan: the Category 4 Plan for 85% of the time.  Current Exercise Plan: Cardio for 45 minutes 3 times per week..  Assessment/Plan:   1. Prediabetes Not at goal. Goal is HgbA1c < 5.7.  Medication: None.    Plan:  He will continue to focus on protein-rich, low simple carbohydrate foods. We reviewed the importance of hydration, regular exercise for stress reduction, and restorative sleep.   Lab Results  Component Value Date   HGBA1C 6.0 (H) 12/10/2020   Lab Results  Component Value Date   INSULIN 27.8 (H) 12/10/2020   2. Elevated ALT measurement Reviewed labs and these numbers have normalized.  3. Vitamin D deficiency Not at goal. Current vitamin D is 16.2, tested on 12/10/2020. Optimal goal > 50 ng/dL.   Plan: Continue current OTC vitamin D supplementation.  Follow-up for routine testing of Vitamin D, at least 2-3 times per year to avoid over-replacement.  4. Essential hypertension Improving, but not optimized. Medications: Norvasc 2.5 mg daily.   Plan: Avoid buying foods that are: processed, frozen, or prepackaged to avoid excess salt. We will continue to monitor closely alongside his PCP and/or Specialist.     BP Readings from Last 3 Encounters:  02/11/21 131/66  12/10/20 (!) 155/76  09/11/20 130/70   Lab Results  Component Value Date   CREATININE 0.96 12/10/2020   5. Other  hyperlipidemia Lipid-lowering medications: [name] 1 g daily, Crestor 20 mg daily.   Plan: Dietary changes: Increase soluble fiber, decrease simple carbohydrates, decrease saturated fat. Exercise changes: Moderate to vigorous-intensity aerobic activity 150 minutes per week or as tolerated. We will continue to monitor along with PCP/specialists as it pertains to his weight loss journey.  Lab Results  Component Value Date   CHOL 109 12/28/2020   HDL 37 (L) 12/28/2020   LDLCALC 56 12/28/2020   TRIG 78 12/28/2020   CHOLHDL 2.9 12/28/2020   Lab Results  Component Value Date   ALT 44 12/28/2020   AST 48 (H) 12/10/2020   ALKPHOS 78 12/10/2020   BILITOT 0.7 12/10/2020   The ASCVD Risk score [name] DC Jr., et al., 2013) failed to calculate for the following reasons:   The valid total cholesterol range is 130 to 320 mg/dL  6. Obesity, current BMI 34  Course: [name] is currently in the action stage of change. As such, his goal is to continue with weight loss efforts.   Nutrition goals: He has agreed to the Category 4 Plan.   Exercise goals: For substantial health benefits, adults should do at least 150 minutes (2 hours and 30 minutes) a week of moderate-intensity, or 75 minutes (1 hour and 15 minutes) a week of vigorous-intensity aerobic physical activity, or an equivalent combination of moderate- and vigorous-intensity aerobic activity. Aerobic activity should be performed in episodes of at least 10 minutes, and preferably, it should be spread throughout the week.  Behavioral modification  strategies: increasing lean protein intake, decreasing simple carbohydrates, increasing vegetables, increasing water intake and decreasing liquid calories.  [name] has agreed to follow-up with our clinic in 4 weeks. He was informed of the importance of frequent follow-up visits to maximize his success with intensive lifestyle modifications for his multiple health conditions.   Objective:   Blood pressure 131/66,  pulse 78, temperature 98.4 F (36.9 C), temperature source Oral, height 5\' 9"  (1.753 m), weight 230 lb (104.3 kg), SpO2 97 %. Body mass index is 33.97 kg/m.  General: Cooperative, alert, well developed, in no acute distress. HEENT: Conjunctivae and lids unremarkable. Cardiovascular: Regular rhythm.  Lungs: Normal work of breathing. Neurologic: No focal deficits.   Lab Results  Component Value Date   CREATININE 0.96 12/10/2020   BUN 13 12/10/2020   NA 138 12/10/2020   K 4.3 12/10/2020   CL 100 12/10/2020   CO2 22 12/10/2020   Lab Results  Component Value Date   ALT 44 12/28/2020   AST 48 (H) 12/10/2020   ALKPHOS 78 12/10/2020   BILITOT 0.7 12/10/2020   Lab Results  Component Value Date   HGBA1C 6.0 (H) 12/10/2020   HGBA1C 5.7 11/25/2020   Lab Results  Component Value Date   INSULIN 27.8 (H) 12/10/2020   Lab Results  Component Value Date   TSH 0.929 12/10/2020   Lab Results  Component Value Date   CHOL 109 12/28/2020   HDL 37 (L) 12/28/2020   LDLCALC 56 12/28/2020   TRIG 78 12/28/2020   CHOLHDL 2.9 12/28/2020   Lab Results  Component Value Date   WBC 5.6 12/10/2020   HGB 15.4 12/10/2020   HCT 45.2 12/10/2020   MCV 91 12/10/2020   PLT 176 12/10/2020   Lab Results  Component Value Date   IRON 103 11/27/2019   FERRITIN 515.7 (H) 11/27/2019    Obesity Behavioral Intervention:   Approximately 15 minutes were spent on the discussion below.  ASK: We discussed the diagnosis of obesity with [name] today and [name] agreed to give [place] permission to discuss obesity behavioral modification therapy today.  ASSESS: [name] has the diagnosis of obesity and his BMI today is 33.97. [name] is in the action stage of change.   ADVISE: [name] was educated on the multiple health risks of obesity as well as the benefit of weight loss to improve his health. He was advised of the need for long term treatment and the importance of lifestyle modifications to improve his current health and  to decrease his risk of future health problems.  AGREE: Multiple dietary modification options and treatment options were discussed and [name] agreed to follow the recommendations documented in the above note.  ARRANGE: [name] was educated on the importance of frequent visits to treat obesity as outlined per CMS and USPSTF guidelines and agreed to schedule his next follow up appointment today.  Attestation Statements:   Reviewed by clinician on day of visit: allergies, medications, problem list, medical history, surgical history, family history, social history, and previous encounter notes.  Leodis Binet Friedenbach, CMA, am acting as Location manager for [employer], DO.  I have reviewed the above documentation for accuracy and completeness, and I agree with the above. Briscoe Deutscher, DO

## 2021-03-06 DIAGNOSIS — G4733 Obstructive sleep apnea (adult) (pediatric): Secondary | ICD-10-CM | POA: Diagnosis not present

## 2021-03-13 DIAGNOSIS — Z20822 Contact with and (suspected) exposure to covid-19: Secondary | ICD-10-CM | POA: Diagnosis not present

## 2021-03-29 ENCOUNTER — Ambulatory Visit (INDEPENDENT_AMBULATORY_CARE_PROVIDER_SITE_OTHER): Payer: PPO | Admitting: Family Medicine

## 2021-04-06 DIAGNOSIS — Z85828 Personal history of other malignant neoplasm of skin: Secondary | ICD-10-CM | POA: Diagnosis not present

## 2021-04-06 DIAGNOSIS — D229 Melanocytic nevi, unspecified: Secondary | ICD-10-CM | POA: Diagnosis not present

## 2021-04-06 DIAGNOSIS — L819 Disorder of pigmentation, unspecified: Secondary | ICD-10-CM | POA: Diagnosis not present

## 2021-04-06 DIAGNOSIS — D1801 Hemangioma of skin and subcutaneous tissue: Secondary | ICD-10-CM | POA: Diagnosis not present

## 2021-04-06 DIAGNOSIS — L570 Actinic keratosis: Secondary | ICD-10-CM | POA: Diagnosis not present

## 2021-04-06 DIAGNOSIS — I8393 Asymptomatic varicose veins of bilateral lower extremities: Secondary | ICD-10-CM | POA: Diagnosis not present

## 2021-04-06 DIAGNOSIS — L905 Scar conditions and fibrosis of skin: Secondary | ICD-10-CM | POA: Diagnosis not present

## 2021-04-06 DIAGNOSIS — L821 Other seborrheic keratosis: Secondary | ICD-10-CM | POA: Diagnosis not present

## 2021-04-06 DIAGNOSIS — L814 Other melanin hyperpigmentation: Secondary | ICD-10-CM | POA: Diagnosis not present

## 2021-04-20 DIAGNOSIS — I10 Essential (primary) hypertension: Secondary | ICD-10-CM | POA: Diagnosis not present

## 2021-04-20 DIAGNOSIS — E785 Hyperlipidemia, unspecified: Secondary | ICD-10-CM | POA: Diagnosis not present

## 2021-04-20 DIAGNOSIS — E039 Hypothyroidism, unspecified: Secondary | ICD-10-CM | POA: Diagnosis not present

## 2021-04-20 DIAGNOSIS — K219 Gastro-esophageal reflux disease without esophagitis: Secondary | ICD-10-CM | POA: Diagnosis not present

## 2021-04-27 ENCOUNTER — Ambulatory Visit (INDEPENDENT_AMBULATORY_CARE_PROVIDER_SITE_OTHER): Payer: PPO | Admitting: Family Medicine

## 2021-06-22 DIAGNOSIS — N529 Male erectile dysfunction, unspecified: Secondary | ICD-10-CM | POA: Diagnosis not present

## 2021-06-22 DIAGNOSIS — Z8042 Family history of malignant neoplasm of prostate: Secondary | ICD-10-CM | POA: Diagnosis not present

## 2021-06-22 DIAGNOSIS — N401 Enlarged prostate with lower urinary tract symptoms: Secondary | ICD-10-CM | POA: Diagnosis not present

## 2021-07-05 DIAGNOSIS — F419 Anxiety disorder, unspecified: Secondary | ICD-10-CM | POA: Diagnosis not present

## 2021-07-05 DIAGNOSIS — E039 Hypothyroidism, unspecified: Secondary | ICD-10-CM | POA: Diagnosis not present

## 2021-07-05 DIAGNOSIS — G4730 Sleep apnea, unspecified: Secondary | ICD-10-CM | POA: Diagnosis not present

## 2021-07-05 DIAGNOSIS — I10 Essential (primary) hypertension: Secondary | ICD-10-CM | POA: Diagnosis not present

## 2021-07-05 DIAGNOSIS — Z23 Encounter for immunization: Secondary | ICD-10-CM | POA: Diagnosis not present

## 2021-07-05 DIAGNOSIS — E785 Hyperlipidemia, unspecified: Secondary | ICD-10-CM | POA: Diagnosis not present

## 2021-07-05 DIAGNOSIS — R7303 Prediabetes: Secondary | ICD-10-CM | POA: Diagnosis not present

## 2021-07-05 DIAGNOSIS — K760 Fatty (change of) liver, not elsewhere classified: Secondary | ICD-10-CM | POA: Diagnosis not present

## 2021-07-05 DIAGNOSIS — R5383 Other fatigue: Secondary | ICD-10-CM | POA: Diagnosis not present

## 2021-09-22 ENCOUNTER — Ambulatory Visit: Payer: PPO | Admitting: Cardiology

## 2021-10-05 ENCOUNTER — Telehealth: Payer: Self-pay | Admitting: Cardiology

## 2021-10-05 NOTE — Telephone Encounter (Signed)
Spoke with the patient who states that about a week ago he had been out raking leaves and afterwards he had some chest tightness. He states that it felt like an uncomfortable pressure. He states that he had some slight trouble taking a deep breath with the pain. He denies any other associated symptoms of radiation of pain. He states that he has had about 5-6 episodes since then. He does not associate the events with any activity. He states that episodes resolved on their own. He has not used any nitroglycerin. This morning he had an episode of chest tightness and took a 325 mg aspirin. He states that tightness resolved after this. He denies any current chest pain/pressure. Current blood pressure is 160/82. I have advised patient on nitroglycerin use for return of symptoms and ER precautions. Patient verbalized understanding.  I have made him an appointment to see Dr. Radford Pax tomorrow morning.

## 2021-10-05 NOTE — Telephone Encounter (Signed)
Pt c/o of Chest Pain: STAT if CP now or developed within 24 hours  1. Are you having CP right now? CHEST PRESSURE, PT IS NOT HAVING CHEST PRESSURE BUT HE HAD IT EARLIER THIS MORNING  2. Are you experiencing any other symptoms (ex. SOB, nausea, vomiting, sweating)? NO TO ALL  3. How long have you been experiencing CP? OFF AND ON FOR ABOUT A WEEK  4. Is your CP continuous or coming and going? COMING AND GOING  5. Have you taken Nitroglycerin? PT HAVE NEVER HAD TO TAKE NITRO, PT TOOK 325 MG ASPIRIN AND IT EASED THE PAIN  ?

## 2021-10-06 ENCOUNTER — Ambulatory Visit: Payer: PPO | Admitting: Cardiology

## 2021-10-06 ENCOUNTER — Other Ambulatory Visit: Payer: Self-pay

## 2021-10-06 ENCOUNTER — Encounter: Payer: Self-pay | Admitting: Cardiology

## 2021-10-06 VITALS — BP 140/82 | HR 83 | Ht 70.5 in | Wt 248.8 lb

## 2021-10-06 DIAGNOSIS — I10 Essential (primary) hypertension: Secondary | ICD-10-CM

## 2021-10-06 DIAGNOSIS — E785 Hyperlipidemia, unspecified: Secondary | ICD-10-CM

## 2021-10-06 DIAGNOSIS — I2510 Atherosclerotic heart disease of native coronary artery without angina pectoris: Secondary | ICD-10-CM

## 2021-10-06 DIAGNOSIS — I2583 Coronary atherosclerosis due to lipid rich plaque: Secondary | ICD-10-CM | POA: Diagnosis not present

## 2021-10-06 DIAGNOSIS — R079 Chest pain, unspecified: Secondary | ICD-10-CM

## 2021-10-06 LAB — LIPID PANEL
Chol/HDL Ratio: 4 ratio (ref 0.0–5.0)
Cholesterol, Total: 128 mg/dL (ref 100–199)
HDL: 32 mg/dL — ABNORMAL LOW
LDL Chol Calc (NIH): 63 mg/dL (ref 0–99)
Triglycerides: 195 mg/dL — ABNORMAL HIGH (ref 0–149)
VLDL Cholesterol Cal: 33 mg/dL (ref 5–40)

## 2021-10-06 LAB — ALT: ALT: 65 [IU]/L — ABNORMAL HIGH (ref 0–44)

## 2021-10-06 MED ORDER — AMLODIPINE BESYLATE 5 MG PO TABS: 5.0000 mg | ORAL_TABLET | Freq: Every day | ORAL | 3 refills | Status: AC

## 2021-10-06 NOTE — Patient Instructions (Signed)
Medication Instructions:  Your physician has recommended you make the following change in your medication: 1) INCREASE amlodipine to 5 mg daily  *If you need a refill on your cardiac medications before your next appointment, please call your pharmacy*   Lab Work: TODAY: FLP and ALT If you have labs (blood work) drawn today and your tests are completely normal, you will receive your results only by: [place] (if you have MyChart) OR A paper copy in the mail If you have any lab test that is abnormal or we need to change your treatment, we will call you to review the results.   Testing/Procedures: Your physician has requested that you have a lexiscan myoview. For further information please visit [email or web address]. Please follow instruction sheet, as given.   Follow-Up: At [facility], you and your health needs are our priority.  As part of our continuing mission to provide you with exceptional heart care, we have created designated Provider Care Teams.  These Care Teams include your primary Cardiologist (physician) and Advanced Practice Providers (APPs -  Physician Assistants and Nurse Practitioners) who all work together to provide you with the care you need, when you need it.  We recommend signing up for the patient portal called "MyChart".  Sign up information is provided on this After Visit Summary.  MyChart is used to connect with patients for Virtual Visits (Telemedicine).  Patients are able to view lab/test results, encounter notes, upcoming appointments, etc.  Non-urgent messages can be sent to your provider as well.   To learn more about what you can do with MyChart, go to [email or web address].    Your next appointment:   3 month(s)  The format for your next appointment:   In Person  Provider:   Fransico Him, MD

## 2021-10-06 NOTE — Progress Notes (Signed)
Cardiology Office Note:    Date:  10/06/2021   ID:  Martin Hopkins, DOB 04/13/1951, MRN [id number]  PCP:  Harlan Stains, MD  Cardiologist:  Fransico Him, MD    Referring MD: Harlan Stains, MD   Chief Complaint  Patient presents with   Chest Pain   Coronary Artery Disease   Hypertension   Hyperlipidemia     History of Present Illness:    Martin Hopkins is a 70 y.o. male with a hx of OSA on CPAP, HTN, obesity and recent chest tightness.  Coronary CTA showed a calcium score of 944 with moderate CAD in the pLAD, prox and mid LCx and mild CAD in the prox and mid RCA.  FFR showed no flow limiting lesions.    Admitted to [facility] 11/09/2019 with SOB and right sided CP lasting several minutes.  He had awakened with profound SOB which his CPAP did not help him with and then developed the right sided CP lasted a few minutes.  The next night the same thing happened without the CP.  He also had had LE edema nd a 10lb weight gain over a few weeks.  He underwent right and left heart cath showing widely patent coronary arteries with nonobstructive LAD and RCA <50%.  There was slight reduction in TIMI flow in the LAD raising question of microvascular disease and EF was 50% with normal LVEDP. 2D echo showed normal LVF with EF 60-65%.  DDimer was negative and trop neg x 2.  He was started on IV lasix for mildly elevated BNP and LE edema. He was started on amlodpine 2.5mg  daily for possible microvascular angina.  Prior to discharge after cath he had recurrent SOB and chest CTA showed no PE.  He was sent home to followup with PCP to rule out noncardiac causes of SOB.  He also was discharged home on [name] and fenofibrate in addition to his statin but only started back on the statin.  The fenofibrate made his joints hurt.   He is here today for evaluation of chest pain.  He tells me that a week ago on Monday he had an episode of chest pressure in the midsternal area with no radiation lasting about an hour and resolving  on its own.  This occurred at rest.  There was no associated shortness of breath, nausea, vomiting or diaphoresis.  He then had a recurrent episode last Wednesday for about an hour of similar discomfort.  This past Friday he then had an episode of severe sharp chest pain midsternal lasting 30 seconds and resolved on its own.  He says he took some aspirin and his symptoms felt better.  He has not had any further episodes since then.  He denies any SOB, DOE, PND, orthopnea, LE edema, dizziness, palpitations or syncope.  He is compliant with He meds and is tolerating meds with no SE.     Past Medical History:  Diagnosis Date   Adenomatous polyp    Dr Cristina Gong   Allergic rhinitis    ED (erectile dysfunction)    Fatty liver    Gastritis    GERD (gastroesophageal reflux disease)    mild   H/O: duodenal ulcer 1982   Hypercholesteremia    Hypertension    Hypogonadism male    Hypothyroidism    Lumbar disc disease    L5--Dr Cay Schillings   Microvascular angina (HCC)    Obesity (BMI 30-39.9)    OSA (obstructive sleep apnea)    AHI 40/hr  now on CPAP 9cm H2O   Prediabetes     Past Surgical History:  Procedure Laterality Date   colonscopy  10/2015   [facility]   LUMBAR LAMINECTOMY  09/28/2011   Procedure: MICRODISCECTOMY LUMBAR LAMINECTOMY;  Surgeon: Tobi Bastos;  Location: [facility];  Service: Orthopedics;  Laterality: Left;  Hemi-Laminectomy, Microdiscectomy  Lumbar 5 - Sacral 1 Left     NASAL FRACTURE SURGERY  10/1971   NECK SURGERY  2014   OTHER SURGICAL HISTORY  1972   nasal surgery    OTHER SURGICAL HISTORY  2011   right trigger finger surgey    right hand surgery     remove knots   RIGHT/LEFT HEART CATH AND CORONARY ANGIOGRAPHY N/A 11/13/2019   Procedure: RIGHT/LEFT HEART CATH AND CORONARY ANGIOGRAPHY;  Surgeon: Belva Crome, MD;  Location: [facility];  Service: Cardiovascular;  Laterality: N/A;   VASECTOMY      Current Medications: Current Meds  Medication Sig   ALPRAZolam  (XANAX) 0.25 MG tablet Take 0.25 mg by mouth 2 (two) times daily as needed.   amLODipine (NORVASC) 5 MG tablet Take 1 tablet (5 mg total) by mouth daily.   furosemide (LASIX) 20 MG tablet Take 1 tablet (20 mg total) by mouth daily.   icosapent Ethyl ([name]) 1 g capsule 1 capsules with meals   levothyroxine (SYNTHROID) 175 MCG tablet Take 175 mcg by mouth daily before breakfast.   meloxicam (MOBIC) 7.5 MG tablet Take 7.5 mg by mouth daily.   nitroGLYCERIN (NITROSTAT) 0.4 MG SL tablet Place 1 tablet (0.4 mg total) under the tongue every 5 (five) minutes x 3 doses as needed for chest pain.   omeprazole (PRILOSEC) 40 MG capsule Take 40 mg by mouth every morning.   rosuvastatin (CRESTOR) 20 MG tablet Take 1 tablet by mouth daily.   [DISCONTINUED] amLODipine (NORVASC) 2.5 MG tablet Take 1 tablet (2.5 mg total) by mouth daily.     Allergies:   Penicillins and Lipitor [atorvastatin]   Social History   Socioeconomic History   Marital status: Married    Spouse name: Not on file   Number of children: 3   Years of education: Not on file   Highest education level: Not on file  Occupational History   Occupation: school bus driver/retired  Tobacco Use   Smoking status: Former    Packs/day: 2.00    Years: 20.00    Pack years: 40.00    Types: Cigarettes    Quit date: 11/07/1992    Years since quitting: 28.9   Smokeless tobacco: Never  Vaping Use   Vaping Use: Never used  Substance and Sexual Activity   Alcohol use: Yes    Comment: rare   Drug use: No   Sexual activity: Not on file  Other Topics Concern   Not on file  Social History Narrative   Not on file   Social Determinants of Health   Financial Resource Strain: Not on file  Food Insecurity: Not on file  Transportation Needs: Not on file  Physical Activity: Not on file  Stress: Not on file  Social Connections: Not on file     Family History: The patient's family history includes CAD in his father and paternal grandfather;  Hypertension in his brother and mother; Peripheral vascular disease in his brother and father; Sleep apnea in his father; Thyroid disease in his father. There is no history of Colon cancer, Liver cancer, Esophageal cancer, Stomach cancer, or Rectal cancer.  ROS:   Please see the history of present illness.    Review of Systems  Musculoskeletal:  Negative for muscle weakness.   All other systems reviewed and negative.   EKGs/Labs/Other Studies Reviewed:    The following studies were reviewed today: Cardiac cath, 2D echo, labs  EKG:  EKG is ordered today and demonstrates NSR with no ST changes  Recent Labs: 12/10/2020: BUN 13; Creatinine, Ser 0.96; Hemoglobin 15.4; Platelets 176; Potassium 4.3; Sodium 138; TSH 0.929 12/28/2020: ALT 44   Recent Lipid Panel    Component Value Date/Time   CHOL 109 12/28/2020 0820   TRIG 78 12/28/2020 0820   HDL 37 (L) 12/28/2020 0820   CHOLHDL 2.9 12/28/2020 0820   CHOLHDL 7.3 11/13/2019 0548   VLDL 28 11/13/2019 0548   LDLCALC 56 12/28/2020 0820    Physical Exam:    VS:  BP 140/82 (BP Location: Left Arm, Patient Position: Sitting, Cuff Size: Large)   Pulse 83   Ht 5' 10.5" (1.791 m)   Wt 248 lb 12.8 oz (112.9 kg)   SpO2 98%   BMI 35.19 kg/m     Wt Readings from Last 3 Encounters:  10/06/21 248 lb 12.8 oz (112.9 kg)  02/11/21 230 lb (104.3 kg)  12/10/20 244 lb (110.7 kg)     GEN: Well nourished, well developed in no acute distress HEENT: Normal NECK: No JVD; No carotid bruits LYMPHATICS: No lymphadenopathy CARDIAC:RRR, no murmurs, rubs, gallops RESPIRATORY:  Clear to auscultation without rales, wheezing or rhonchi  ABDOMEN: Soft, non-tender, non-distended MUSCULOSKELETAL:  No edema; No deformity  SKIN: Warm and dry NEUROLOGIC:  Alert and oriented x 3 PSYCHIATRIC:  Normal affect   ASSESSMENT:    1. Chest pain, unspecified type   2. Essential hypertension, benign   3. Coronary artery disease due to lipid rich plaque   4.  Hyperlipidemia, unspecified hyperlipidemia type    PLAN:    In order of problems listed above:  1. Chest pain -His chest pain is atypical and that it only occurs at rest.  2 of the episodes were a pressure sensation and the other 1 was a sharp pain.  Suspect this is not cardiac and likely due to GERD with esophageal spasm. -His last cath in January 2021 showed less than 50% nonobstructive disease of the LAD and RCA.  It was felt that he possibly had microvascular disease as there was slight reduction in TIMI flow in the LAD. -Recommend increasing amlodipine to 5 mg daily for possible microvascular disease -We will get Lexiscan Myoview to rule out ischemia -Shared Decision Making/Informed Consent The risks [chest pain, shortness of breath, cardiac arrhythmias, dizziness, blood pressure fluctuations, myocardial infarction, stroke/transient ischemic attack, nausea, vomiting, allergic reaction, radiation exposure, metallic taste sensation and life-threatening complications (estimated to be 1 in 10,000)], benefits (risk stratification, diagnosing coronary artery disease, treatment guidance) and alternatives of a nuclear stress test were discussed in detail with Mr. Staup and he agrees to proceed.  2. HTN -BP is well controlled on exam today -Continue prescription drug management with amlodipine 2.5 mg daily with as needed refills  3.  ASCAD -nonobstructive by  cath in the LAD and RCA < 50% -he has not had any further anginal sx -continue ASA, amlodipine and statin  4.  HLD -LDL goal < 70 -LDL was 76 in March 2021 -repeat FLP and ALT -stopped fenofibrate -continue Simvastatin 40mg  daily and [name] 2gm BID   Medication Adjustments/Labs and Tests Ordered: Current medicines are reviewed at  length with the patient today.  Concerns regarding medicines are outlined above.  Orders Placed This Encounter  Procedures   ALT   Lipid panel   Cardiac Stress Test: Informed Consent Details:  Physician/Practitioner Attestation; Transcribe to consent form and obtain patient signature   MYOCARDIAL PERFUSION IMAGING   EKG 12-Lead    Meds ordered this encounter  Medications   amLODipine (NORVASC) 5 MG tablet    Sig: Take 1 tablet (5 mg total) by mouth daily.    Dispense:  90 tablet    Refill:  3     Signed, Fransico Him, MD  10/06/2021 11:23 AM     [facility]

## 2021-10-14 ENCOUNTER — Telehealth: Payer: Self-pay | Admitting: Cardiology

## 2021-10-14 MED ORDER — ICOSAPENT ETHYL 1 G PO CAPS: 2.0000 g | ORAL_CAPSULE | Freq: Two times a day (BID) | ORAL | 3 refills | Status: AC

## 2021-10-14 NOTE — Telephone Encounter (Signed)
Leeroy Bock, RPH-CPP  10/12/2021 12:13 PM EST     Pt previously taking [name] 1 capsule every other day because he didn't want to take more. Was convinced to increase to 1 capsule daily at Jan 2022 visit. Since TG remain elevated, recommend he increase to 2g BID recommended dose (will need new rx sent in). He should continue rosuvastatin 20mg  daily  The patient has been notified of the result and verbalized understanding.  All questions (if any) were answered. Antonieta Iba, RN 10/14/2021 10:21 AM   Patient agrees to increase vascepa to 2g BID.

## 2021-10-14 NOTE — Telephone Encounter (Signed)
Patient was returning call 

## 2021-10-21 ENCOUNTER — Telehealth (HOSPITAL_COMMUNITY): Payer: Self-pay | Admitting: *Deleted

## 2021-10-21 NOTE — Telephone Encounter (Signed)
Patient given detailed instructions per Myocardial Perfusion Study Information Sheet for the test on  10/28/21 Patient notified to arrive 15 minutes early and that it is imperative to arrive on time for appointment to keep from having the test rescheduled.  If you need to cancel or reschedule your appointment, please call the office within 24 hours of your appointment. . Patient verbalized understanding. Kirstie Peri

## 2021-10-28 ENCOUNTER — Encounter (HOSPITAL_COMMUNITY): Payer: PPO

## 2021-10-29 ENCOUNTER — Telehealth (HOSPITAL_COMMUNITY): Payer: Self-pay | Admitting: *Deleted

## 2021-10-29 NOTE — Telephone Encounter (Signed)
Left message on voicemail per DPR in reference to upcoming appointment scheduled on 11/05/21 at 10:45 with detailed instructions given per Myocardial Perfusion Study Information Sheet for the test. LM to arrive 15 minutes early, and that it is imperative to arrive on time for appointment to keep from having the test rescheduled. If you need to cancel or reschedule your appointment, please call the office within 24 hours of your appointment. Failure to do so may result in a cancellation of your appointment, and a $50 no show fee. Phone number given for call back for any questions.

## 2021-11-05 ENCOUNTER — Ambulatory Visit (HOSPITAL_COMMUNITY): Payer: PPO | Attending: Cardiology

## 2021-11-05 ENCOUNTER — Other Ambulatory Visit: Payer: Self-pay

## 2021-11-05 DIAGNOSIS — R079 Chest pain, unspecified: Secondary | ICD-10-CM | POA: Diagnosis not present

## 2021-11-05 DIAGNOSIS — E785 Hyperlipidemia, unspecified: Secondary | ICD-10-CM

## 2021-11-05 DIAGNOSIS — I2583 Coronary atherosclerosis due to lipid rich plaque: Secondary | ICD-10-CM

## 2021-11-05 DIAGNOSIS — I10 Essential (primary) hypertension: Secondary | ICD-10-CM | POA: Diagnosis not present

## 2021-11-05 DIAGNOSIS — I2510 Atherosclerotic heart disease of native coronary artery without angina pectoris: Secondary | ICD-10-CM

## 2021-11-05 LAB — MYOCARDIAL PERFUSION IMAGING
LV dias vol: 103 mL (ref 62–150)
LV sys vol: 41 mL
Nuc Stress EF: 60 %
Peak HR: 99 {beats}/min
Rest HR: 79 {beats}/min
Rest Nuclear Isotope Dose: 30.7 mCi
SDS: 1
SRS: 0
SSS: 1
ST Depression (mm): 0 mm
TID: 1.1

## 2021-11-05 MED ORDER — REGADENOSON 0.4 MG/5ML IV SOLN
0.4000 mg | Freq: Once | INTRAVENOUS | Status: AC
  Administered 2021-11-05: 0.4 mg via INTRAVENOUS

## 2021-11-05 MED ORDER — TECHNETIUM TC 99M TETROFOSMIN IV KIT
30.7000 | PACK | Freq: Once | INTRAVENOUS | Status: AC | PRN
  Administered 2021-11-05: 30.7 via INTRAVENOUS
  Filled 2021-11-05: qty 31

## 2021-11-05 MED ORDER — TECHNETIUM TC 99M TETROFOSMIN IV KIT
10.4000 | PACK | Freq: Once | INTRAVENOUS | Status: AC | PRN
  Administered 2021-11-05: 10.4 via INTRAVENOUS
  Filled 2021-11-05: qty 11

## 2021-11-10 DIAGNOSIS — H5213 Myopia, bilateral: Secondary | ICD-10-CM | POA: Diagnosis not present

## 2021-11-10 DIAGNOSIS — H40013 Open angle with borderline findings, low risk, bilateral: Secondary | ICD-10-CM | POA: Diagnosis not present

## 2021-11-11 ENCOUNTER — Other Ambulatory Visit: Payer: Self-pay | Admitting: Neurosurgery

## 2021-11-11 DIAGNOSIS — Z6836 Body mass index (BMI) 36.0-36.9, adult: Secondary | ICD-10-CM | POA: Diagnosis not present

## 2021-11-11 DIAGNOSIS — M542 Cervicalgia: Secondary | ICD-10-CM | POA: Diagnosis not present

## 2021-11-11 DIAGNOSIS — I10 Essential (primary) hypertension: Secondary | ICD-10-CM | POA: Diagnosis not present

## 2021-12-02 ENCOUNTER — Ambulatory Visit
Admission: RE | Admit: 2021-12-02 | Discharge: 2021-12-02 | Disposition: A | Discharge status: Home / Self Care | Payer: PPO | Source: Ambulatory Visit | Attending: Neurosurgery | Admitting: Neurosurgery

## 2021-12-02 ENCOUNTER — Other Ambulatory Visit: Payer: Self-pay

## 2021-12-02 DIAGNOSIS — M4322 Fusion of spine, cervical region: Secondary | ICD-10-CM | POA: Diagnosis not present

## 2021-12-02 DIAGNOSIS — M542 Cervicalgia: Secondary | ICD-10-CM | POA: Diagnosis not present

## 2021-12-23 DIAGNOSIS — M5412 Radiculopathy, cervical region: Secondary | ICD-10-CM | POA: Diagnosis not present

## 2021-12-27 DIAGNOSIS — R110 Nausea: Secondary | ICD-10-CM | POA: Diagnosis not present

## 2021-12-27 DIAGNOSIS — R197 Diarrhea, unspecified: Secondary | ICD-10-CM | POA: Diagnosis not present

## 2021-12-27 DIAGNOSIS — R509 Fever, unspecified: Secondary | ICD-10-CM | POA: Diagnosis not present

## 2021-12-30 ENCOUNTER — Telehealth: Payer: Self-pay | Admitting: *Deleted

## 2021-12-30 NOTE — Telephone Encounter (Signed)
° °  Pre-operative Risk Assessment    Patient Name: Martin Hopkins  DOB: 1951/06/17 MRN: [id number]      Request for Surgical Clearance    Procedure:   SPINAL INJECTION  Date of Surgery:  Clearance TBD URGENT PER CLEARANCE REQUEST                                Surgeon:  DR. Marlaine Hind Surgeon's Group or Practice Name:  [facility] Phone number:  [phone] Fax number:  [phone]   Type of Clearance Requested:   - Medical  - Pharmacy:  Hold [name] x 3-5  DAYS PRIOR    Type of Anesthesia:  Local    Additional requests/questions:    Jiles Prows   12/30/2021, 3:31 PM

## 2021-12-31 NOTE — Telephone Encounter (Signed)
° °  Patient Name: Martin Hopkins  DOB: Oct 11, 1951 MRN: [id number]  Primary Cardiologist: Fransico Him, MD  Chart reviewed as part of pre-operative protocol coverage.  Patient contacted on 12/31/2021 as part of preoperative screening process. Spoke with patient's wife (DPR on file). Patient unavailable to come to the phone. Left message for patient to call back.   Lenna Sciara, NP 12/31/2021, 9:02 AM

## 2022-01-03 NOTE — Telephone Encounter (Signed)
° °  Primary Cardiologist: Fransico Him, MD  Chart reviewed as part of pre-operative protocol coverage. Given past medical history and time since last visit, based on ACC/AHA guidelines, Martin Hopkins would be at acceptable risk for the planned procedure without further cardiovascular testing.   Patient [name] may be held for 3-5 days prior to his injection.  Please have him resume [name] as soon as hemostasis is achieved.  Patient was advised that if he develops new symptoms prior to surgery to contact our office to arrange a follow-up appointment.  He verbalized understanding.  I will route this recommendation to the requesting party via Epic fax function and remove from pre-op pool.  Please call with questions.  Jossie Ng. Tannar Broker NP-C    01/03/2022, 4:51 PM [place] [address] Office [phone] Fax [phone]

## 2022-01-28 ENCOUNTER — Ambulatory Visit (INDEPENDENT_AMBULATORY_CARE_PROVIDER_SITE_OTHER): Payer: PPO | Admitting: Cardiology

## 2022-01-28 ENCOUNTER — Telehealth: Payer: Self-pay | Admitting: *Deleted

## 2022-01-28 ENCOUNTER — Other Ambulatory Visit: Payer: Self-pay

## 2022-01-28 ENCOUNTER — Encounter: Payer: Self-pay | Admitting: Cardiology

## 2022-01-28 VITALS — BP 126/70 | HR 77 | Ht 70.0 in | Wt 245.0 lb

## 2022-01-28 DIAGNOSIS — E785 Hyperlipidemia, unspecified: Secondary | ICD-10-CM

## 2022-01-28 DIAGNOSIS — I2510 Atherosclerotic heart disease of native coronary artery without angina pectoris: Secondary | ICD-10-CM

## 2022-01-28 DIAGNOSIS — G4733 Obstructive sleep apnea (adult) (pediatric): Secondary | ICD-10-CM | POA: Diagnosis not present

## 2022-01-28 DIAGNOSIS — R079 Chest pain, unspecified: Secondary | ICD-10-CM

## 2022-01-28 DIAGNOSIS — Z9989 Dependence on other enabling machines and devices: Secondary | ICD-10-CM | POA: Diagnosis not present

## 2022-01-28 DIAGNOSIS — I2583 Coronary atherosclerosis due to lipid rich plaque: Secondary | ICD-10-CM

## 2022-01-28 DIAGNOSIS — I10 Essential (primary) hypertension: Secondary | ICD-10-CM

## 2022-01-28 NOTE — Telephone Encounter (Signed)
Order placed to [employer] via community message 

## 2022-01-28 NOTE — Progress Notes (Signed)
?Cardiology Office Note:   ? ?Date:  01/28/2022  ? ?ID:  Martin Hopkins, DOB [birth year], MRN [id number] ? ?PCP:  Harlan Stains, MD  ?Cardiologist:  Fransico Him, MD   ? ?Referring MD: Harlan Stains, MD  ? ?Chief Complaint  ?Patient presents with  ? Coronary Artery Disease  ? Hypertension  ? Hyperlipidemia  ? Sleep Apnea  ? ? ? ?History of Present Illness:   ? ?Martin Hopkins is a 71 y.o. male with a hx of OSA on CPAP, HTN, obesity and recent chest tightness.  Coronary CTA showed a calcium score of 944 with moderate CAD in the pLAD, prox and mid LCx and mild CAD in the prox and mid RCA.  FFR showed no flow limiting lesions.   ? ?Right and left heart cath done for SOB and CP 10/2020 showed widely patent coronary arteries with nonobstructive LAD and RCA <50%.  There was slight reduction in TIMI flow in the LAD raising question of microvascular disease and EF was 50% with normal LVEDP. 2D echo showed normal LVF with EF 60-65%.  He was started on amlodpine 2.'5mg'$  daily for possible microvascular angina.  ? ?At last office visit he had recurrent chest pain and underwent Lexiscan Myoview which showed no ischemia. ? ?He is here today for followup and is doing well.  He denies any chest pain or pressure, SOB, DOE, PND, orthopnea, LE edema, dizziness, palpitations or syncope. He is compliant with his meds and is tolerating meds with no SE.   ? ?He is doing well with his CPAP device and thinks that he has gotten used to it.  He tolerates the mask but feels the pressure it too high in the middle of the night.   Since going on CPAP he feels rested in the am and has no significant daytime sleepiness.  He denies any significant mouth or nasal dryness or nasal congestion.  He does not think that he snores.     ? ?Past Medical History:  ?Diagnosis Date  ? Adenomatous polyp   ? Dr Cristina Gong  ? Allergic rhinitis   ? ED (erectile dysfunction)   ? Fatty liver   ? Gastritis   ? GERD (gastroesophageal reflux disease)   ? mild  ? H/O: duodenal  ulcer 1982  ? Hypercholesteremia   ? Hypertension   ? Hypogonadism male   ? Hypothyroidism   ? Lumbar disc disease   ? L5--Dr Cay Schillings  ? 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 Obesity (BMI 30-39.9)   ? OSA (obstructive sleep apnea)   ? AHI 40/hr now on CPAP 9cm H2O  ? Prediabetes   ? ? ?Past Surgical History:  ?Procedure Laterality Date  ? colonscopy  10/2015  ? [facility]  ? LUMBAR LAMINECTOMY  09/28/2011  ? Procedure: MICRODISCECTOMY LUMBAR LAMINECTOMY;  Surgeon: Tobi Bastos;  Location: [facility];  Service: Orthopedics;  Laterality: Left;  Hemi-Laminectomy, Microdiscectomy  Lumbar 5 - Sacral 1 Left    ? NASAL FRACTURE SURGERY  10/1971  ? NECK SURGERY  2014  ? OTHER SURGICAL HISTORY  1972  ? nasal surgery   ? OTHER SURGICAL HISTORY  2011  ? right trigger finger surgey   ? right hand surgery    ? remove knots  ? RIGHT/LEFT HEART CATH AND CORONARY ANGIOGRAPHY N/A 11/13/2019  ? Procedure: RIGHT/LEFT HEART CATH AND CORONARY ANGIOGRAPHY;  Surgeon: Belva Crome, MD;  Location: [facility];  Service: Cardiovascular;  Laterality: N/A;  ? VASECTOMY    ? ? ?  Current Medications: ?Current Meds  ?Medication Sig  ? ALPRAZolam (XANAX) 0.25 MG tablet Take 0.25 mg by mouth 2 (two) times daily as needed.  ? amLODipine (NORVASC) 5 MG tablet Take 1 tablet (5 mg total) by mouth daily.  ? furosemide (LASIX) 20 MG tablet Take 1 tablet (20 mg total) by mouth daily.  ? icosapent Ethyl ([name]) 1 g capsule Take 2 capsules (2 g total) by mouth 2 (two) times daily.  ? levothyroxine (SYNTHROID) 175 MCG tablet Take 175 mcg by mouth daily before breakfast.  ? meloxicam (MOBIC) 7.5 MG tablet Take 7.5 mg by mouth daily.  ? nitroGLYCERIN (NITROSTAT) 0.4 MG SL tablet Place 1 tablet (0.4 mg total) under the tongue every 5 (five) minutes x 3 doses as needed for chest pain.  ? omeprazole (PRILOSEC) 40 MG capsule Take 40 mg by mouth every morning.  ? rosuvastatin (CRESTOR) 20 MG tablet Take 1 tablet by mouth daily.  ?  ? ?Allergies:   Penicillins and  Lipitor [atorvastatin]  ? ?Social History  ? ?Socioeconomic History  ? Marital status: Married  ?  Spouse name: Not on file  ? Number of children: 3  ? Years of education: Not on file  ? Highest education level: Not on file  ?Occupational History  ? Occupation: school bus driver/retired  ?Tobacco Use  ? Smoking status: Former  ?  Packs/day: 2.00  ?  Years: 20.00  ?  Pack years: 40.00  ?  Types: Cigarettes  ?  Quit date: 11/07/1992  ?  Years since quitting: 29.2  ? Smokeless tobacco: Never  ?Vaping Use  ? Vaping Use: Never used  ?Substance and Sexual Activity  ? Alcohol use: Yes  ?  Comment: rare  ? Drug use: No  ? Sexual activity: Not on file  ?Other Topics Concern  ? Not on file  ?Social History Narrative  ? Not on file  ? ?Social Determinants of Health  ? ?Financial Resource Strain: Not on file  ?Food Insecurity: Not on file  ?Transportation Needs: Not on file  ?Physical Activity: Not on file  ?Stress: Not on file  ?Social Connections: Not on file  ?  ? ?Family History: ?The patient's family history includes CAD in his father and paternal grandfather; Hypertension in his brother and mother; Peripheral vascular disease in his brother and father; Sleep apnea in his father; Thyroid disease in his father. There is no history of Colon cancer, Liver cancer, Esophageal cancer, Stomach cancer, or Rectal cancer. ? ?ROS:   ?Please see the history of present illness.    ?Review of Systems  ?Musculoskeletal:  Negative for muscle weakness.   ?All other systems reviewed and negative.  ? ?EKGs/Labs/Other Studies Reviewed:   ? ?The following studies were reviewed today: ?Lexiscan Myoview ? ?EKG:  EKG is not ordered today  ? ?Recent Labs: ?10/06/2021: ALT 65  ? ?Recent Lipid Panel ?   ?Component Value Date/Time  ? CHOL 128 10/06/2021 0950  ? TRIG 195 (H) 10/06/2021 0950  ? HDL 32 (L) 10/06/2021 0950  ? CHOLHDL 4.0 10/06/2021 0950  ? CHOLHDL 7.3 11/13/2019 0548  ? VLDL 28 11/13/2019 0548  ? [place] 63 10/06/2021 0950  ? ? ?Physical  Exam:   ? ?VS:  BP 126/70   Pulse 77   Ht '5\' 10"'$  (1.778 m)   Wt 245 lb (111.1 kg)   SpO2 96%   BMI 35.15 kg/m?    ? ?Wt Readings from Last 3 Encounters:  ?01/28/22 245 lb (111.1  kg)  ?11/05/21 248 lb (112.5 kg)  ?10/06/21 248 lb 12.8 oz (112.9 kg)  ?  ? ?GEN: Well nourished, well developed in no acute distress ?HEENT: Normal ?NECK: No JVD; No carotid bruits ?LYMPHATICS: No lymphadenopathy ?CARDIAC:RRR, no murmurs, rubs, gallops ?RESPIRATORY:  Clear to auscultation without rales, wheezing or rhonchi  ?ABDOMEN: Soft, non-tender, non-distended ?MUSCULOSKELETAL:  No edema; No deformity  ?SKIN: Warm and dry ?NEUROLOGIC:  Alert and oriented x 3 ?PSYCHIATRIC:  Normal affect   ?ASSESSMENT:   ? ?1. Chest pain, unspecified type   ?2. Essential hypertension, benign   ?3. Coronary artery disease due to lipid rich plaque   ?4. Hyperlipidemia, unspecified hyperlipidemia type   ?5. OSA on CPAP   ? ?PLAN:   ? ?In order of problems listed above: ? ?1. Chest pain ?-His chest pain is atypical and that it only occurs at rest.  2 of the episodes were a pressure sensation and the other 1 was a sharp pain.  Suspect this is not cardiac and likely due to GERD with esophageal spasm. ?-His last cath in January 2021 showed Hopkins than 50% nonobstructive disease of the LAD and RCA.  It was felt that he possibly had microvascular disease as there was slight reduction in TIMI flow in the LAD. ?-Amlodipine was increased to 5 mg daily at last office visit for possible microvascular disease given recurrent chest pain ?-Lexiscan Myoview showed no ischemia ?-He is doing well today with no recurrent chest pain ? ?2. HTN ?-BP is adequately controlled on exam today ?-Continue prescription drug management with amlodipine 5 mg daily with as needed refills ? ?3.  ASCAD ?-nonobstructive by  cath in the LAD and RCA < 50% ?-He denies any anginal symptoms ?-Continue prescription drug management aspirin 81 mg daily, high-dose statin and amlodipine 5 mg  daily ? ?4.  HLD ?-LDL goal < 70 ?-I have personally reviewed and interpreted outside labs performed by patient's PCP which showed LDL 63, HDL 32, triglycerides 195, ALT 65 ?-continue Simvastatin '40mg'$  daily and Vasce

## 2022-01-28 NOTE — Patient Instructions (Signed)
Medication Instructions:  ?Your physician recommends that you continue on your current medications as directed. Please refer to the Current Medication list given to you today. ? ?*If you need a refill on your cardiac medications before your next appointment, please call your pharmacy* ? ?Lab Work: ?TODAY: insulin level, lipoprotein A ? ?If you have labs (blood work) drawn today and your tests are completely normal, you will receive your results only by: ?MyChart Message (if you have MyChart) OR ?A paper copy in the mail ?If you have any lab test that is abnormal or we need to change your treatment, we will call you to review the results. ? ?Follow-Up: ?At [facility], you and your health needs are our priority.  As part of our continuing mission to provide you with exceptional heart care, we have created designated Provider Care Teams.  These Care Teams include your primary Cardiologist (physician) and Advanced Practice Providers (APPs -  Physician Assistants and Nurse Practitioners) who all work together to provide you with the care you need, when you need it. ? ?Your next appointment:   ?1 year(s) ? ?The format for your next appointment:   ?In Person ? ?Provider:   ?Fransico Him, MD   ? ?Other Instructions ?You have been referred to the lipid clinic.   ?

## 2022-01-28 NOTE — Addendum Note (Signed)
Addended by: Antonieta Iba on: 01/28/2022 09:44 AM ? ? Modules accepted: Orders ? ?

## 2022-01-28 NOTE — Telephone Encounter (Signed)
-----   Message from Antonieta Iba, RN sent at 01/28/2022 10:04 AM EDT ----- ?Per Dr. Radford Pax: ?decrease auto CPAP to 4-15cm H2O and get a download in 4 weeks.  ?Thanks! ? ?

## 2022-02-05 LAB — LIPOPROTEIN A (LPA): Lipoprotein (a): 24 nmol/L (ref <75.0)

## 2022-02-05 LAB — INSULIN, FREE AND TOTAL
Free Insulin: 22 uU/mL — ABNORMAL HIGH
Total Insulin: 22 uU/mL

## 2022-02-07 ENCOUNTER — Telehealth: Payer: Self-pay

## 2022-02-07 DIAGNOSIS — E161 Other hypoglycemia: Secondary | ICD-10-CM

## 2022-02-07 NOTE — Telephone Encounter (Signed)
-----   Message from Sueanne Margarita, MD sent at 02/07/2022  1:12 PM EDT ----- ?Please refer to Dr. Courtney Paris with endocrinology due to elevated insulin level ?----- Message ----- ?From: Antonieta Iba, RN ?Sent: 02/07/2022  12:12 PM EDT ?To: Sueanne Margarita, MD ? ?The patient has been notified of the result and verbalized understanding.  All questions (if any) were answered. ?Antonieta Iba, RN 02/07/2022 12:11 PM  ? ?Patient confirms that he was fasting for lab work.  ? ?

## 2022-02-07 NOTE — Telephone Encounter (Signed)
Referral has been placed. 

## 2022-02-21 ENCOUNTER — Ambulatory Visit: Payer: PPO

## 2022-02-25 NOTE — Addendum Note (Signed)
Addended by: Freada Bergeron on: 02/25/2022 09:53 AM ? ? Modules accepted: Orders ? ?

## 2022-03-17 NOTE — Progress Notes (Signed)
Patient ID: ANKUSH GINTZ                 DOB: Oct 30, 1951                    MRN: [id number] ? ? ? ? ?HPI: ?Martin Hopkins is a 71 y.o. male patient referred to lipid clinic by Dr Radford Pax. PMH is significant for coronary CTA 03/2019 with calcium score of 944 with moderate CAD in the pLAD, prox and mid LCx and mild CAD in the prox and mid RCA (FFR with no flow limiting lesions), OSA on CPAP, HTN, obesity, ED, GERD, hypothyroidism, and preDM. Cath done for SOB and CP 10/2020 showed widely patent coronary arteries with nonobstructive LAD and RCA < 50%. EF normal at 60-65%. Amlodipine has been titrated for atypical chest pain thought to be microvascular disease. ? ?Pt presents today for follow up. Reports tolerating medications well. Previously experienced joint pain on Lipitor, currently tolerating Crestor much better. Previously took [name] only 1 capsule once daily. Reports he has been taking 2 capsules BID as prescribed since his most recent visit with Dr Radford Pax about 6 weeks ago. [facility]. Has been following an 1800 calorie/day diet higher in protein and veggies. Cut out sweets from his diet, no soda or sweet tea. Rarely drinks alcohol. Frustrated that he has not been able to lose weight. Inquires about Ozempic. Does have hx of preDM with A1c up to 6, although this improved most recently to 5.7 a year ago. Has been referred to endocrinology due to his elevated insulin level. Establishes with Dr Chalmers Cater on 6/29. Occasionally experiences dizziness if he moves quickly. BP typically 130/70s at home, did have 1 high reading last week of 152/86. Amlodipine has been helping with his chest pain. ? ?Current Medications: rosuvastatin '20mg'$  daily, [name] 2g BID ?Intolerances: atorvastatin - joint pain ?Risk Factors: elevated CAC, HTN, preDM, obesity ?LDL goal: '70mg'$ /dL ? ?Diet: 1800 calories a day. 2 boiled eggs in the AM and 60 cal bread. Lunch - veggie sub. Dinner - high protein lower  calorie meals. No soda or sweet tea. 3 beers a week. ? ?Exercise: Left knee is bad, walks as able (up to 20 mins before knee pain starts). 8-10k steps a day. ? ?Family History: CAD in his father and paternal grandfather; Hypertension in his brother and mother; Peripheral vascular disease in his brother and father; Sleep apnea in his father; Thyroid disease in his father. ? ?Social History: Former smoker 2 PPD for 40 years, quit in 1994. Rare alcohol use. ? ?Labs: ?10/06/21: TC 128, TG 195, HDL 32, LDL 63 (rosuvastatin '20mg'$  daily, [name] 2g BID) ?01/28/22: Lp(a) 24, elevated free insulin level ?12/10/20: A1c 6% ? ?Past Medical History:  ?Diagnosis Date  ? Adenomatous polyp   ? Dr Cristina Gong  ? Allergic rhinitis   ? ED (erectile dysfunction)   ? Fatty liver   ? Gastritis   ? GERD (gastroesophageal reflux disease)   ? mild  ? H/O: duodenal ulcer 1982  ? Hypercholesteremia   ? Hypertension   ? Hypogonadism male   ? Hypothyroidism   ? Lumbar disc disease   ? L5--Dr Cay Schillings  ? 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 Obesity (BMI 30-39.9)   ? OSA (obstructive sleep apnea)   ? AHI 40/hr now on CPAP 9cm H2O  ? Prediabetes   ? ? ?Current Outpatient Medications on File Prior to Visit  ?Medication Sig Dispense Refill  ?  ALPRAZolam (XANAX) 0.25 MG tablet Take 0.25 mg by mouth 2 (two) times daily as needed.    ? amLODipine (NORVASC) 5 MG tablet Take 1 tablet (5 mg total) by mouth daily. 90 tablet 3  ? furosemide (LASIX) 20 MG tablet Take 1 tablet (20 mg total) by mouth daily. 30 tablet 11  ? icosapent Ethyl ([name]) 1 g capsule Take 2 capsules (2 g total) by mouth 2 (two) times daily. 360 capsule 3  ? levothyroxine (SYNTHROID) 175 MCG tablet Take 175 mcg by mouth daily before breakfast.    ? meloxicam (MOBIC) 7.5 MG tablet Take 7.5 mg by mouth daily.    ? nitroGLYCERIN (NITROSTAT) 0.4 MG SL tablet Place 1 tablet (0.4 mg total) under the tongue every 5 (five) minutes x 3 doses as needed for chest pain. 25 tablet 3  ? omeprazole (PRILOSEC) 40  MG capsule Take 40 mg by mouth every morning.    ? rosuvastatin (CRESTOR) 20 MG tablet Take 1 tablet by mouth daily.    ? ?No current facility-administered medications on file prior to visit.  ? ? ?Allergies  ?Allergen Reactions  ? Penicillins Shortness Of Breath  ? Lipitor [Atorvastatin]   ?  Joint pain ?  ? ? ?Assessment/Plan: ? ?1. Hyperlipidemia - LDL 63 at goal < 70 on rosuvastatin '20mg'$  daily. TG mildly elevated last fall at 195 above goal < 150. Since then, he has increased his [name] from 1g daily to 2g BID. Pt is fasting today, will recheck updated baseline labs. He is already watching his carb, sugar, and alcohol intake.  ? ?2. PreDM - Has been unable to lose weight despite eating a heart healthy diet. Recently referred to endocrinology due to elevated insulin level. Discussed GLP1RA for DM and weight loss. His [employer] insurance does not cover weight loss medications, and does not cover other GLPs without a diagnosis of diabetes. He has a hx of preDM, will recheck A1c today. ? ?Dayane Hillenburg E. Shemica Meath, PharmD, BCACP, CPP ?[place] [place], [place] [address] ?Phone: [phone]; Fax: [phone] ?03/18/2022 10:03 AM ? ? ? ?

## 2022-03-18 ENCOUNTER — Ambulatory Visit: Payer: PPO | Admitting: Pharmacist

## 2022-03-18 VITALS — BP 138/78 | HR 74

## 2022-03-18 DIAGNOSIS — E785 Hyperlipidemia, unspecified: Secondary | ICD-10-CM

## 2022-03-18 DIAGNOSIS — E782 Mixed hyperlipidemia: Secondary | ICD-10-CM | POA: Diagnosis not present

## 2022-03-18 DIAGNOSIS — E161 Other hypoglycemia: Secondary | ICD-10-CM | POA: Diagnosis not present

## 2022-03-18 DIAGNOSIS — R931 Abnormal findings on diagnostic imaging of heart and coronary circulation: Secondary | ICD-10-CM | POA: Diagnosis not present

## 2022-03-18 DIAGNOSIS — R7303 Prediabetes: Secondary | ICD-10-CM | POA: Diagnosis not present

## 2022-03-18 NOTE — Patient Instructions (Signed)
It was nice to see you today! ? ?Continue taking your current medications ? ?We'll recheck an updated lipid panel and A1c today ? ?

## 2022-03-19 LAB — LIPID PANEL
Chol/HDL Ratio: 3.9 ratio (ref 0.0–5.0)
Cholesterol, Total: 125 mg/dL (ref 100–199)
HDL: 32 mg/dL — ABNORMAL LOW (ref >39)
LDL Chol Calc (NIH): 59 mg/dL (ref 0–99)
Triglycerides: 205 mg/dL — ABNORMAL HIGH (ref 0–149)
VLDL Cholesterol Cal: 34 mg/dL (ref 5–40)

## 2022-03-19 LAB — HEMOGLOBIN A1C
Est. average glucose Bld gHb Est-mCnc: 123 mg/dL
Hgb A1c MFr Bld: 5.9 % — ABNORMAL HIGH (ref 4.8–5.6)

## 2022-03-28 DIAGNOSIS — I10 Essential (primary) hypertension: Secondary | ICD-10-CM | POA: Diagnosis not present

## 2022-03-28 DIAGNOSIS — F419 Anxiety disorder, unspecified: Secondary | ICD-10-CM | POA: Diagnosis not present

## 2022-03-28 DIAGNOSIS — R7303 Prediabetes: Secondary | ICD-10-CM | POA: Diagnosis not present

## 2022-03-28 DIAGNOSIS — E039 Hypothyroidism, unspecified: Secondary | ICD-10-CM | POA: Diagnosis not present

## 2022-03-28 DIAGNOSIS — I700 Atherosclerosis of aorta: Secondary | ICD-10-CM | POA: Diagnosis not present

## 2022-03-28 DIAGNOSIS — M5030 Other cervical disc degeneration, unspecified cervical region: Secondary | ICD-10-CM | POA: Diagnosis not present

## 2022-03-28 DIAGNOSIS — K219 Gastro-esophageal reflux disease without esophagitis: Secondary | ICD-10-CM | POA: Diagnosis not present

## 2022-03-28 DIAGNOSIS — Z125 Encounter for screening for malignant neoplasm of prostate: Secondary | ICD-10-CM | POA: Diagnosis not present

## 2022-03-28 DIAGNOSIS — K760 Fatty (change of) liver, not elsewhere classified: Secondary | ICD-10-CM | POA: Diagnosis not present

## 2022-03-28 DIAGNOSIS — Z0000 Encounter for general adult medical examination without abnormal findings: Secondary | ICD-10-CM | POA: Diagnosis not present

## 2022-03-28 DIAGNOSIS — E785 Hyperlipidemia, unspecified: Secondary | ICD-10-CM | POA: Diagnosis not present

## 2022-05-03 ENCOUNTER — Telehealth: Payer: Self-pay

## 2022-05-03 NOTE — Telephone Encounter (Signed)
**Note De-Identified Cristin Penaflor Obfuscation** Icosapent PA started through covermymeds. Key: Marolyn Hammock

## 2022-05-04 NOTE — Telephone Encounter (Signed)
**Note De-Identified Orville Mena Obfuscation** Nelda Bucks KeyPat Patrick - PA Case ID: [id number] - Rx #: [id number] Outcome: Approved on June 27 27-JUN-23:27-JUN-24 Quantity:;Icosapent Ethyl 1GM OR CAPS Quantity:360; Drug: Icosapent Ethyl 1GM capsules Form: [place] [employer] Electronic Prior Authorization Form 2017 NCPDP   I have notified [id number] [place] - [address] (Ph: [phone] of this approval.

## 2022-05-05 DIAGNOSIS — E7800 Pure hypercholesterolemia, unspecified: Secondary | ICD-10-CM | POA: Diagnosis not present

## 2022-05-05 DIAGNOSIS — E669 Obesity, unspecified: Secondary | ICD-10-CM | POA: Diagnosis not present

## 2022-05-05 DIAGNOSIS — I10 Essential (primary) hypertension: Secondary | ICD-10-CM | POA: Diagnosis not present

## 2022-05-05 DIAGNOSIS — R7303 Prediabetes: Secondary | ICD-10-CM | POA: Diagnosis not present

## 2022-05-05 DIAGNOSIS — I2510 Atherosclerotic heart disease of native coronary artery without angina pectoris: Secondary | ICD-10-CM | POA: Diagnosis not present

## 2022-05-05 DIAGNOSIS — E039 Hypothyroidism, unspecified: Secondary | ICD-10-CM | POA: Diagnosis not present

## 2022-06-15 ENCOUNTER — Encounter (INDEPENDENT_AMBULATORY_CARE_PROVIDER_SITE_OTHER): Payer: Self-pay

## 2022-07-08 DIAGNOSIS — R7303 Prediabetes: Secondary | ICD-10-CM | POA: Diagnosis not present

## 2022-07-08 DIAGNOSIS — E039 Hypothyroidism, unspecified: Secondary | ICD-10-CM | POA: Diagnosis not present

## 2022-07-08 DIAGNOSIS — Z125 Encounter for screening for malignant neoplasm of prostate: Secondary | ICD-10-CM | POA: Diagnosis not present

## 2022-07-08 DIAGNOSIS — E785 Hyperlipidemia, unspecified: Secondary | ICD-10-CM | POA: Diagnosis not present

## 2022-07-27 DIAGNOSIS — M5412 Radiculopathy, cervical region: Secondary | ICD-10-CM | POA: Diagnosis not present

## 2022-08-04 DIAGNOSIS — R7303 Prediabetes: Secondary | ICD-10-CM | POA: Diagnosis not present

## 2022-08-04 DIAGNOSIS — E039 Hypothyroidism, unspecified: Secondary | ICD-10-CM | POA: Diagnosis not present

## 2022-08-16 DIAGNOSIS — M542 Cervicalgia: Secondary | ICD-10-CM | POA: Diagnosis not present

## 2022-09-07 DIAGNOSIS — E039 Hypothyroidism, unspecified: Secondary | ICD-10-CM | POA: Diagnosis not present

## 2022-09-28 DIAGNOSIS — L578 Other skin changes due to chronic exposure to nonionizing radiation: Secondary | ICD-10-CM | POA: Diagnosis not present

## 2022-09-28 DIAGNOSIS — D225 Melanocytic nevi of trunk: Secondary | ICD-10-CM | POA: Diagnosis not present

## 2022-09-28 DIAGNOSIS — L821 Other seborrheic keratosis: Secondary | ICD-10-CM | POA: Diagnosis not present

## 2022-09-28 DIAGNOSIS — L814 Other melanin hyperpigmentation: Secondary | ICD-10-CM | POA: Diagnosis not present

## 2022-09-28 DIAGNOSIS — L570 Actinic keratosis: Secondary | ICD-10-CM | POA: Diagnosis not present

## 2022-10-07 DIAGNOSIS — K219 Gastro-esophageal reflux disease without esophagitis: Secondary | ICD-10-CM | POA: Diagnosis not present

## 2022-10-07 DIAGNOSIS — E669 Obesity, unspecified: Secondary | ICD-10-CM | POA: Diagnosis not present

## 2022-10-07 DIAGNOSIS — E039 Hypothyroidism, unspecified: Secondary | ICD-10-CM | POA: Diagnosis not present

## 2022-10-07 DIAGNOSIS — I700 Atherosclerosis of aorta: Secondary | ICD-10-CM | POA: Diagnosis not present

## 2022-10-07 DIAGNOSIS — E785 Hyperlipidemia, unspecified: Secondary | ICD-10-CM | POA: Diagnosis not present

## 2022-10-07 DIAGNOSIS — F419 Anxiety disorder, unspecified: Secondary | ICD-10-CM | POA: Diagnosis not present

## 2022-10-07 DIAGNOSIS — I10 Essential (primary) hypertension: Secondary | ICD-10-CM | POA: Diagnosis not present

## 2022-10-14 DIAGNOSIS — N401 Enlarged prostate with lower urinary tract symptoms: Secondary | ICD-10-CM | POA: Diagnosis not present

## 2022-10-14 DIAGNOSIS — Z8042 Family history of malignant neoplasm of prostate: Secondary | ICD-10-CM | POA: Diagnosis not present

## 2022-10-14 DIAGNOSIS — N529 Male erectile dysfunction, unspecified: Secondary | ICD-10-CM | POA: Diagnosis not present

## 2022-11-25 DIAGNOSIS — E039 Hypothyroidism, unspecified: Secondary | ICD-10-CM | POA: Diagnosis not present

## 2023-01-19 DIAGNOSIS — E039 Hypothyroidism, unspecified: Secondary | ICD-10-CM | POA: Diagnosis not present

## 2023-03-22 DIAGNOSIS — L578 Other skin changes due to chronic exposure to nonionizing radiation: Secondary | ICD-10-CM | POA: Diagnosis not present

## 2023-03-22 DIAGNOSIS — L570 Actinic keratosis: Secondary | ICD-10-CM | POA: Diagnosis not present

## 2023-03-31 DIAGNOSIS — S60221A Contusion of right hand, initial encounter: Secondary | ICD-10-CM | POA: Diagnosis not present

## 2023-04-12 DIAGNOSIS — G4730 Sleep apnea, unspecified: Secondary | ICD-10-CM | POA: Diagnosis not present

## 2023-04-12 DIAGNOSIS — E785 Hyperlipidemia, unspecified: Secondary | ICD-10-CM | POA: Diagnosis not present

## 2023-04-12 DIAGNOSIS — I10 Essential (primary) hypertension: Secondary | ICD-10-CM | POA: Diagnosis not present

## 2023-04-12 DIAGNOSIS — I700 Atherosclerosis of aorta: Secondary | ICD-10-CM | POA: Diagnosis not present

## 2023-04-12 DIAGNOSIS — R7303 Prediabetes: Secondary | ICD-10-CM | POA: Diagnosis not present

## 2023-04-12 DIAGNOSIS — R7309 Other abnormal glucose: Secondary | ICD-10-CM | POA: Diagnosis not present

## 2023-04-12 DIAGNOSIS — F419 Anxiety disorder, unspecified: Secondary | ICD-10-CM | POA: Diagnosis not present

## 2023-04-12 DIAGNOSIS — E669 Obesity, unspecified: Secondary | ICD-10-CM | POA: Diagnosis not present

## 2023-04-12 DIAGNOSIS — K219 Gastro-esophageal reflux disease without esophagitis: Secondary | ICD-10-CM | POA: Diagnosis not present

## 2023-04-12 DIAGNOSIS — Z9989 Dependence on other enabling machines and devices: Secondary | ICD-10-CM | POA: Diagnosis not present

## 2023-04-12 DIAGNOSIS — E039 Hypothyroidism, unspecified: Secondary | ICD-10-CM | POA: Diagnosis not present

## 2023-04-12 DIAGNOSIS — Z0000 Encounter for general adult medical examination without abnormal findings: Secondary | ICD-10-CM | POA: Diagnosis not present

## 2023-04-12 DIAGNOSIS — K760 Fatty (change of) liver, not elsewhere classified: Secondary | ICD-10-CM | POA: Diagnosis not present

## 2023-04-18 DIAGNOSIS — H5213 Myopia, bilateral: Secondary | ICD-10-CM | POA: Diagnosis not present

## 2023-04-18 DIAGNOSIS — H2513 Age-related nuclear cataract, bilateral: Secondary | ICD-10-CM | POA: Diagnosis not present

## 2023-04-18 DIAGNOSIS — H40013 Open angle with borderline findings, low risk, bilateral: Secondary | ICD-10-CM | POA: Diagnosis not present

## 2023-04-18 DIAGNOSIS — H40053 Ocular hypertension, bilateral: Secondary | ICD-10-CM | POA: Diagnosis not present

## 2023-05-03 DIAGNOSIS — M180 Bilateral primary osteoarthritis of first carpometacarpal joints: Secondary | ICD-10-CM | POA: Diagnosis not present

## 2023-05-03 DIAGNOSIS — M1812 Unilateral primary osteoarthritis of first carpometacarpal joint, left hand: Secondary | ICD-10-CM | POA: Diagnosis not present

## 2023-05-03 DIAGNOSIS — S62334A Displaced fracture of neck of fourth metacarpal bone, right hand, initial encounter for closed fracture: Secondary | ICD-10-CM | POA: Diagnosis not present

## 2023-05-04 ENCOUNTER — Encounter: Payer: Self-pay | Admitting: Cardiology

## 2023-05-04 ENCOUNTER — Ambulatory Visit: Payer: PPO | Attending: Cardiology | Admitting: Cardiology

## 2023-05-04 VITALS — BP 126/78 | HR 72 | Ht 70.0 in | Wt 232.0 lb

## 2023-05-04 DIAGNOSIS — G4733 Obstructive sleep apnea (adult) (pediatric): Secondary | ICD-10-CM | POA: Diagnosis not present

## 2023-05-04 DIAGNOSIS — E782 Mixed hyperlipidemia: Secondary | ICD-10-CM

## 2023-05-04 DIAGNOSIS — I2510 Atherosclerotic heart disease of native coronary artery without angina pectoris: Secondary | ICD-10-CM | POA: Diagnosis not present

## 2023-05-04 DIAGNOSIS — I10 Essential (primary) hypertension: Secondary | ICD-10-CM

## 2023-05-04 MED ORDER — ASPIRIN 81 MG PO TBEC: 81.0000 mg | DELAYED_RELEASE_TABLET | Freq: Every day | ORAL | 3 refills | Status: AC

## 2023-05-04 NOTE — Addendum Note (Signed)
Addended by: Luellen Pucker on: 05/04/2023 10:04 AM   Modules accepted: Orders

## 2023-05-04 NOTE — Progress Notes (Signed)
Cardiology Office Note:    Date:  05/04/2023   ID:  Martin Hopkins, DOB 1951-05-30, MRN [id number]  PCP:  Laurann Montana, MD  Cardiologist:  Armanda Magic, MD    Referring MD: Laurann Montana, MD   Chief Complaint  Patient presents with   Coronary Artery Disease   Hypertension   Hyperlipidemia   Sleep Apnea     History of Present Illness:    Martin Hopkins is a 72 y.o. male with a hx of OSA on CPAP, HTN, obesity and recent chest tightness.  Coronary CTA showed a calcium score of 944 with moderate CAD in the pLAD, prox and mid LCx and mild CAD in the prox and mid RCA.  FFR showed no flow limiting lesions.    Right and left heart cath done for SOB and CP 10/2020 showed widely patent coronary arteries with nonobstructive LAD and RCA <50%.  There was slight reduction in TIMI flow in the LAD raising question of microvascular disease and EF was 50% with normal LVEDP. 2D echo showed normal LVF with EF 60-65%.  He was started on amlodpine 2.5mg  daily for possible microvascular angina.  He had recurrent chest pain and underwent Lexiscan Myoview which showed no ischemia.  At last OV he felt is PAP pressure was too high so it was decreased to auto CPAP from 4 to 15cm H2O. and he is doing well with his PAP device and thinks that he has gotten used to it.  He tolerates the full face mask.  He still feels the pressure is high. Since going on PAP he feels rested in the am and has no significant daytime sleepiness if he sleeps well the night before.  Sometimes he does not sleep well at night due to neck pain.  He denies any significant mouth or nasal dryness or nasal congestion.  He does not think that he snores.    He is here today for followup and is doing well.  He has gained about 10lbs since Christmas and is on a diet.  He denies any chest pain or pressure, SOB, DOE, PND, orthopnea, LE edema, dizziness, palpitations or syncope. He is compliant with his meds and is tolerating meds with no SE.    Past  Medical History:  Diagnosis Date   Adenomatous polyp    Dr Matthias Hughs   Allergic rhinitis    ED (erectile dysfunction)    Fatty liver    Gastritis    GERD (gastroesophageal reflux disease)    mild   H/O: duodenal ulcer 1982   Hypercholesteremia    Hypertension    Hypogonadism male    Hypothyroidism    Lumbar disc disease    L5--Dr Juliene Pina   Microvascular angina    Obesity (BMI 30-39.9)    OSA (obstructive sleep apnea)    AHI 40/hr now on CPAP 9cm H2O   Prediabetes     Past Surgical History:  Procedure Laterality Date   colonscopy  10/2015   [facility]   LUMBAR LAMINECTOMY  09/28/2011   Procedure: MICRODISCECTOMY LUMBAR LAMINECTOMY;  Surgeon: Jacki Cones;  Location: [facility];  Service: Orthopedics;  Laterality: Left;  Hemi-Laminectomy, Microdiscectomy  Lumbar 5 - Sacral 1 Left     NASAL FRACTURE SURGERY  10/1971   NECK SURGERY  2014   OTHER SURGICAL HISTORY  1972   nasal surgery    OTHER SURGICAL HISTORY  2011   right trigger finger surgey    right hand surgery  remove knots   RIGHT/LEFT HEART CATH AND CORONARY ANGIOGRAPHY N/A 11/13/2019   Procedure: RIGHT/LEFT HEART CATH AND CORONARY ANGIOGRAPHY;  Surgeon: Lyn Records, MD;  Location: [facility];  Service: Cardiovascular;  Laterality: N/A;   VASECTOMY      Current Medications: Current Meds  Medication Sig   ALPRAZolam (XANAX) 0.25 MG tablet Take 0.25 mg by mouth 2 (two) times daily as needed.   amLODipine (NORVASC) 5 MG tablet Take 1 tablet (5 mg total) by mouth daily.   furosemide (LASIX) 20 MG tablet Take 1 tablet (20 mg total) by mouth daily.   icosapent Ethyl ([name]) 1 g capsule Take 2 capsules (2 g total) by mouth 2 (two) times daily.   levothyroxine (SYNTHROID) 112 MCG tablet Take 112 mcg by mouth every morning.   meloxicam (MOBIC) 7.5 MG tablet Take 7.5 mg by mouth daily.   nitroGLYCERIN (NITROSTAT) 0.4 MG SL tablet Place 1 tablet (0.4 mg total) under the tongue every 5 (five) minutes x 3 doses  as needed for chest pain.   omeprazole (PRILOSEC) 40 MG capsule Take 40 mg by mouth every morning.   rosuvastatin (CRESTOR) 20 MG tablet Take 1 tablet by mouth daily.   [DISCONTINUED] levothyroxine (SYNTHROID) 175 MCG tablet Take 175 mcg by mouth daily before breakfast.     Allergies:   Penicillins and Lipitor [atorvastatin]   Social History   Socioeconomic History   Marital status: Married    Spouse name: Not on file   Number of children: 3   Years of education: Not on file   Highest education level: Not on file  Occupational History   Occupation: school bus driver/retired  Tobacco Use   Smoking status: Former    Packs/day: 2.00    Years: 20.00    Additional pack years: 0.00    Total pack years: 40.00    Types: Cigarettes    Quit date: 11/07/1992    Years since quitting: 30.5   Smokeless tobacco: Never  Vaping Use   Vaping Use: Never used  Substance and Sexual Activity   Alcohol use: Yes    Comment: rare   Drug use: No   Sexual activity: Not on file  Other Topics Concern   Not on file  Social History Narrative   Not on file   Social Determinants of Health   Financial Resource Strain: Not on file  Food Insecurity: Not on file  Transportation Needs: Not on file  Physical Activity: Not on file  Stress: Not on file  Social Connections: Not on file     Family History: The patient's family history includes CAD in his father and paternal grandfather; Hypertension in his brother and mother; Peripheral vascular disease in his brother and father; Sleep apnea in his father; Thyroid disease in his father. There is no history of Colon cancer, Liver cancer, Esophageal cancer, Stomach cancer, or Rectal cancer.  ROS:   Please see the history of present illness.    Review of Systems  Musculoskeletal:  Negative for muscle weakness.    All other systems reviewed and negative.   EKGs/Labs/Other Studies Reviewed:    The following studies were reviewed today: Lexiscan  Myoview  EKG Interpretation  Date/Time:  Thursday May 04 2023 09:42:02 EDT Ventricular Rate:  76 PR Interval:  194 QRS Duration: 90 QT Interval:  382 QTC Calculation: 429 R Axis:   54 Text Interpretation: Normal sinus rhythm Normal ECG When compared with ECG of 12-Nov-2019 03:41, No significant change was  found Confirmed by Armanda Magic [phone]) on 05/04/2023 9:52:26 AM     Recent Labs: No results found for requested labs within last 365 days.   Recent Lipid Panel    Component Value Date/Time   CHOL 125 03/18/2022 1000   TRIG 205 (H) 03/18/2022 1000   HDL 32 (L) 03/18/2022 1000   CHOLHDL 3.9 03/18/2022 1000   CHOLHDL 7.3 11/13/2019 0548   VLDL 28 11/13/2019 0548   LDLCALC 59 03/18/2022 1000    Physical Exam:    VS:  BP 126/78   Pulse 72   Ht 5\' 10"  (1.778 m)   Wt 232 lb (105.2 kg)   SpO2 97%   BMI 33.29 kg/m     Wt Readings from Last 3 Encounters:  05/04/23 232 lb (105.2 kg)  01/28/22 245 lb (111.1 kg)  11/05/21 248 lb (112.5 kg)    GEN: Well nourished, well developed in no acute distress HEENT: Normal NECK: No JVD; No carotid bruits LYMPHATICS: No lymphadenopathy CARDIAC:RRR, no murmurs, rubs, gallops RESPIRATORY:  Clear to auscultation without rales, wheezing or rhonchi  ABDOMEN: Soft, non-tender, non-distended MUSCULOSKELETAL:  No edema; No deformity  SKIN: Warm and dry NEUROLOGIC:  Alert and oriented x 3 PSYCHIATRIC:  Normal affect   ASSESSMENT:    1. CAD in native artery   2. Essential hypertension, benign   3. Mixed hyperlipidemia   4. OSA on CPAP    PLAN:    In order of problems listed above:  1. Chronic stable angina/ASCAD -his chronic CP has been felt to no be angina and likely due to GERD with esophageal spasm. -His last cath in January 2021 showed less than 50% nonobstructive disease of the LAD and RCA.  It was felt that he possibly had microvascular disease as there was slight reduction in TIMI flow in the LAD. Eugenie Birks Myoview 12/22  showed no ischemia -he denies any recent CP -continue prescription drug management with Amlodipine 5mg  daily,  Crestor 20mg  daily with PRN refills -I encouraged him to start ASA 81mg  daily  2. HTN -BP is adequately controlled on exam today -Continue prescription drug management with amlodipine 5 mg daily with as needed refills  3.  HLD -LDL goal < 70 -I have personally reviewed and interpreted outside labs performed by patient's PCP which showed LDL 60, TAG 152 and HDL 36 on 04/12/2023 -continue prescription drug management with Crestor 20mg  daily and [name] 2gm BID with PRN refills  4.  OSA - The patient is tolerating PAP therapy well without any problems. The PAP download performed by his DME was personally reviewed and interpreted by me today and showed an AHI of 0.9 /hr on auto CPAP from 4-15 cm H2O with 97% compliance in using more than 4 hours nightly.  The patient has been using and benefiting from PAP use and will continue to benefit from therapy.  -I will decrease his auto CPAP down further to 4-12cm H2O and get a download in 4 weeks     Medication Adjustments/Labs and Tests Ordered: Current medicines are reviewed at length with the patient today.  Concerns regarding medicines are outlined above.  Orders Placed This Encounter  Procedures   EKG 12-Lead    No orders of the defined types were placed in this encounter.    Signed, Armanda Magic, MD  05/04/2023 9:54 AM    [facility]

## 2023-05-04 NOTE — Patient Instructions (Signed)
Medication Instructions:  Please START taking aspirin 81 mg daily. This medication is available over the counter.  *If you need a refill on your cardiac medications before your next appointment, please call your pharmacy*   Lab Work: None.  If you have labs (blood work) drawn today and your tests are completely normal, you will receive your results only by: MyChart Message (if you have MyChart) OR A paper copy in the mail If you have any lab test that is abnormal or we need to change your treatment, we will call you to review the results.   Testing/Procedures: None.   Follow-Up:   We recommend signing up for the patient portal called "MyChart".  Sign up information is provided on this After Visit Summary.  MyChart is used to connect with patients for Virtual Visits (Telemedicine).  Patients are able to view lab/test results, encounter notes, upcoming appointments, etc.  Non-urgent messages can be sent to your provider as well.   To learn more about what you can do with MyChart, go to [email or web address].    Your next appointment:   1 year(s)  Provider:   Armanda Magic, MD     Other Instructions New Cpap orders have been sent to your DME company.

## 2023-05-09 ENCOUNTER — Telehealth: Payer: Self-pay | Admitting: *Deleted

## 2023-05-09 DIAGNOSIS — I2510 Atherosclerotic heart disease of native coronary artery without angina pectoris: Secondary | ICD-10-CM

## 2023-05-09 DIAGNOSIS — G4733 Obstructive sleep apnea (adult) (pediatric): Secondary | ICD-10-CM

## 2023-05-09 DIAGNOSIS — I10 Essential (primary) hypertension: Secondary | ICD-10-CM

## 2023-05-09 NOTE — Telephone Encounter (Signed)
Order placed to [employer] via community message 

## 2023-05-09 NOTE — Telephone Encounter (Signed)
-----   Message from Quintella Reichert, MD sent at 05/04/2023 10:00 AM EDT ----- decrease his auto CPAP down further to 4-12cm H2O and get a download in 4 weeks

## 2023-06-02 DIAGNOSIS — M180 Bilateral primary osteoarthritis of first carpometacarpal joints: Secondary | ICD-10-CM | POA: Diagnosis not present

## 2023-06-02 DIAGNOSIS — M79645 Pain in left finger(s): Secondary | ICD-10-CM | POA: Diagnosis not present

## 2023-06-02 DIAGNOSIS — M79644 Pain in right finger(s): Secondary | ICD-10-CM | POA: Diagnosis not present

## 2023-07-12 ENCOUNTER — Encounter: Payer: Self-pay | Admitting: Podiatry

## 2023-07-12 ENCOUNTER — Ambulatory Visit: Payer: PPO | Admitting: Podiatry

## 2023-07-12 ENCOUNTER — Ambulatory Visit (INDEPENDENT_AMBULATORY_CARE_PROVIDER_SITE_OTHER): Payer: PPO

## 2023-07-12 DIAGNOSIS — M7731 Calcaneal spur, right foot: Secondary | ICD-10-CM

## 2023-07-12 DIAGNOSIS — M722 Plantar fascial fibromatosis: Secondary | ICD-10-CM

## 2023-07-12 DIAGNOSIS — M778 Other enthesopathies, not elsewhere classified: Secondary | ICD-10-CM

## 2023-07-12 DIAGNOSIS — M5412 Radiculopathy, cervical region: Secondary | ICD-10-CM | POA: Insufficient documentation

## 2023-07-12 MED ORDER — TRIAMCINOLONE ACETONIDE 10 MG/ML IJ SUSP
10.0000 mg | Freq: Once | INTRAMUSCULAR | Status: AC
Start: 2023-07-12 — End: ?

## 2023-07-12 NOTE — Patient Instructions (Signed)

## 2023-07-12 NOTE — Progress Notes (Signed)
Chief Complaint  Patient presents with   Foot Pain    Right heel pain. Sharp pain at times. He has had heel pain in the past but this time the pain is worst.     HPI: 72 y.o. male presenting today with c/o pain in the bottom of the right heel.  Pain is rated as 8/10.  Denies injury.  States that he has had milder version of this on occasion in the past but it would self resolve.  Past Medical History:  Diagnosis Date   Adenomatous polyp    Dr Matthias Hughs   Allergic rhinitis    ED (erectile dysfunction)    Fatty liver    Gastritis    GERD (gastroesophageal reflux disease)    mild   H/O: duodenal ulcer 1982   Hypercholesteremia    Hypertension    Hypogonadism male    Hypothyroidism    Lumbar disc disease    L5--Dr Juliene Pina   Microvascular angina    Obesity (BMI 30-39.9)    OSA (obstructive sleep apnea)    AHI 40/hr now on CPAP 9cm H2O   Prediabetes     Past Surgical History:  Procedure Laterality Date   colonscopy  10/2015   [facility]   LUMBAR LAMINECTOMY  09/28/2011   Procedure: MICRODISCECTOMY LUMBAR LAMINECTOMY;  Surgeon: Jacki Cones;  Location: [facility];  Service: Orthopedics;  Laterality: Left;  Hemi-Laminectomy, Microdiscectomy  Lumbar 5 - Sacral 1 Left     NASAL FRACTURE SURGERY  10/1971   NECK SURGERY  2014   OTHER SURGICAL HISTORY  1972   nasal surgery    OTHER SURGICAL HISTORY  2011   right trigger finger surgey    right hand surgery     remove knots   RIGHT/LEFT HEART CATH AND CORONARY ANGIOGRAPHY N/A 11/13/2019   Procedure: RIGHT/LEFT HEART CATH AND CORONARY ANGIOGRAPHY;  Surgeon: Lyn Records, MD;  Location: [facility];  Service: Cardiovascular;  Laterality: N/A;   VASECTOMY      Allergies  Allergen Reactions   Penicillins Shortness Of Breath   Lipitor [Atorvastatin]     Joint pain      Physical Exam: General: The patient is alert and oriented x3 in no acute distress.  Dermatology:  No ecchymosis, erythema, or edema bilateral.   No open lesions.    Vascular: Palpable pedal pulses bilaterally. Capillary refill within normal limits.  No appreciable edema.    Neurological: Light touch sensation intact bilateral.  MMT 5/5 to lower extremity bilateral. Negative Tinel's sign with percussion of the posterior tibial nerve on the affected extremity.    Musculoskeletal Exam:  There is pain on palpation of the plantarmedial & plantarcentral aspect of right heel.  No gaps or nodules within the plantar fascia.  Positive Windlass mechanism bilateral.  Antalgic gait noted with first few steps upon standing.  No pain on palpation of achilles tendon bilateral.  Ankle df less than 10 degrees with knee extended b/l.  Radiographic Exam (right foot, 3 weightbearing views, 07/12/2023):  Normal osseous mineralization.  Mildly short metatarsal of the first met.  Mild uneven joint space narrowing at the first MPJ.  Inferior and posterior calcaneal spurs noted.  No fracture seen . Assessment/Plan of Care: 1. Plantar fasciitis of right foot   2. Calcaneal spur, right     Meds ordered this encounter  Medications   triamcinolone acetonide (KENALOG) 10 MG/ML injection 10 mg   FOR HOME USE ONLY  DME NIGHT SPLINT FOR HOME USE ONLY DME POWER STEP INSERTS  -Reviewed etiology of plantar fasciitis with patient.  Discussed treatment options with patient today, including cortisone injection, NSAID course of treatment, stretching exercises, physical therapy, use of night splint, rest, icing the heel, arch supports/orthotics, and supportive shoe gear.    With the patient's verbal consent, a corticosteroid injection was administered to the right heel, consisting of a mixture of 1% lidocaine plain, 0.5% Sensorcaine plain, and Kenalog-10 for a total of 1.5cc administered.  A Band-aid was applied. Pain level post-injection is 2/10.  He was fitted for a large night splint which is a static AFO with a soft interface material and the patient will wear this at all  times nonweightbearing when sleeping.  Remove in the morning before ambulating.  He was also fitted and dispensed a pair of power step inserts and size 9-1/2 to wear in his shoes at all times.  Discussed the break-in period of these over the next week.  He was given stretching exercises with illustrated guide to perform daily.  Return in about 4 weeks (around 08/09/2023) for f/u plantar fasciitis.   Clerance Lav, DPM, FACFAS [facility]     [address] [place], [place] [address]                Office [phone]  Fax [phone]

## 2023-07-31 DIAGNOSIS — L570 Actinic keratosis: Secondary | ICD-10-CM | POA: Diagnosis not present

## 2023-08-08 DIAGNOSIS — R051 Acute cough: Secondary | ICD-10-CM | POA: Diagnosis not present

## 2023-08-08 DIAGNOSIS — R0981 Nasal congestion: Secondary | ICD-10-CM | POA: Diagnosis not present

## 2023-08-08 DIAGNOSIS — U071 COVID-19: Secondary | ICD-10-CM | POA: Diagnosis not present

## 2023-08-09 ENCOUNTER — Encounter: Payer: PPO | Admitting: Podiatry

## 2023-08-12 NOTE — Progress Notes (Signed)
Patient was a no-show for his scheduled f/u appointment today.

## 2023-10-14 DIAGNOSIS — J069 Acute upper respiratory infection, unspecified: Secondary | ICD-10-CM | POA: Diagnosis not present

## 2023-10-31 DIAGNOSIS — R052 Subacute cough: Secondary | ICD-10-CM | POA: Diagnosis not present

## 2023-10-31 DIAGNOSIS — Z8616 Personal history of COVID-19: Secondary | ICD-10-CM | POA: Diagnosis not present

## 2023-10-31 DIAGNOSIS — K219 Gastro-esophageal reflux disease without esophagitis: Secondary | ICD-10-CM | POA: Diagnosis not present

## 2024-01-16 ENCOUNTER — Encounter: Payer: Self-pay | Admitting: Cardiology

## 2024-01-16 ENCOUNTER — Ambulatory Visit: Attending: Cardiology | Admitting: Cardiology

## 2024-01-16 VITALS — BP 122/82 | HR 83 | Ht 70.0 in | Wt 240.8 lb

## 2024-01-16 DIAGNOSIS — E782 Mixed hyperlipidemia: Secondary | ICD-10-CM

## 2024-01-16 DIAGNOSIS — I2510 Atherosclerotic heart disease of native coronary artery without angina pectoris: Secondary | ICD-10-CM | POA: Diagnosis not present

## 2024-01-16 DIAGNOSIS — G4733 Obstructive sleep apnea (adult) (pediatric): Secondary | ICD-10-CM

## 2024-01-16 DIAGNOSIS — I10 Essential (primary) hypertension: Secondary | ICD-10-CM

## 2024-01-16 NOTE — Addendum Note (Signed)
 Addended by: Lendon Ka on: 01/16/2024 11:20 AM   Modules accepted: Orders

## 2024-01-16 NOTE — Patient Instructions (Addendum)
 Medication Instructions:  No changes *If you need a refill on your cardiac medications before your next appointment, please call your pharmacy*   Lab Work: Today: lipids, alt  If you have labs (blood work) drawn today and your tests are completely normal, you will receive your results only by: MyChart Message (if you have MyChart) OR A paper copy in the mail If you have any lab test that is abnormal or we need to change your treatment, we will call you to review the results.   Testing/Procedures: Stress PET scan - see instructions below   Follow-Up: Coralee North will call you with your follow up appointment  Other Instructions    Please report to Radiology at the [facility] Main Entrance 30 minutes early for your test.  [address] [place], [place] [address]                         OR   Please report to Radiology at [facility] Main Entrance, medical mall, 30 mins prior to your test.  [address]  [place], [place]  How to Prepare for Your Cardiac PET/CT Stress Test:  Nothing to eat or drink, except water, 3 hours prior to arrival time.  NO caffeine/decaffeinated products, or chocolate 12 hours prior to arrival. (Please note decaffeinated beverages (teas/coffees) still contain caffeine).  If you have caffeine within 12 hours prior, the test will need to be rescheduled.  Medication instructions: Do not take erectile dysfunction medications for 72 hours prior to test (sildenafil, tadalafil) Do not take nitrates (isosorbide mononitrate, Ranexa) the day before or day of test Do not take tamsulosin the day before or morning of test Hold theophylline containing medications for 12 hours. Hold Dipyridamole 48 hours prior to the test.  Diabetic Preparation: If able to eat breakfast prior to 3 hour fasting, you may take all medications, including your insulin. Do not worry if you miss your breakfast dose of insulin - start at your next  meal. If you do not eat prior to 3 hour fast-Hold all diabetes (oral and insulin) medications. Patients who wear a continuous glucose monitor MUST remove the device prior to scanning.  You may take your remaining medications with water.  NO perfume, cologne or lotion on chest or abdomen area.   Total time is 1 to 2 hours; you may want to bring reading material for the waiting time.  IF YOU THINK YOU MAY BE PREGNANT, OR ARE NURSING PLEASE INFORM THE TECHNOLOGIST.  In preparation for your appointment, medication and supplies will be purchased.  Appointment availability is limited, so if you need to cancel or reschedule, please call the Radiology Department Scheduler at [phone] 24 hours in advance to avoid a cancellation fee of $100.00  What to Expect When you Arrive:  Once you arrive and check in for your appointment, you will be taken to a preparation room within the Radiology Department.  A technologist or Nurse will obtain your medical history, verify that you are correctly prepped for the exam, and explain the procedure.  Afterwards, an IV will be started in your arm and electrodes will be placed on your skin for EKG monitoring during the stress portion of the exam. Then you will be escorted to the PET/CT scanner.  There, staff will get you positioned on the scanner and obtain a blood pressure and EKG.  During the exam, you will continue to be connected to the EKG and  blood pressure machines.  A small, safe amount of a radioactive tracer will be injected in your IV to obtain a series of pictures of your heart along with an injection of a stress agent.    After your Exam:  It is recommended that you eat a meal and drink a caffeinated beverage to counter act any effects of the stress agent.  Drink plenty of fluids for the remainder of the day and urinate frequently for the first couple of hours after the exam.  Your doctor will inform you of your test results within 7-10 business  days.  For more information and frequently asked questions, please visit our website: [email or web address]  For questions about your test or how to prepare for your test, please call: Cardiac Imaging Nurse Navigators Office: [phone]

## 2024-01-16 NOTE — Progress Notes (Signed)
 Cardiology Office Note:    Date:  01/16/2024   ID:  Martin Hopkins, DOB 1951/10/24, MRN [id number]  PCP:  Laurann Montana, MD  Cardiologist:  Armanda Magic, MD    Referring MD: Laurann Montana, MD   Chief Complaint  Patient presents with   Coronary Artery Disease   Hypertension   Hyperlipidemia   Sleep Apnea     History of Present Illness:    Martin Hopkins is a 73 y.o. male with a hx of OSA on CPAP, HTN, obesity and recent chest tightness.  Coronary CTA showed a calcium score of 944 with moderate CAD in the pLAD, prox and mid LCx and mild CAD in the prox and mid RCA.  FFR showed no flow limiting lesions.    Right and left heart cath done for SOB and CP 10/2020 showed widely patent coronary arteries with nonobstructive LAD and RCA <50%.  There was slight reduction in TIMI flow in the LAD raising question of microvascular disease and EF was 50% with normal LVEDP. 2D echo showed normal LVF with EF 60-65%.  He was started on amlodpine 2.5mg  daily for possible microvascular angina. He had recurrent chest pain and underwent Lexiscan Myoview which showed no ischemia.    His machine he has right now was not working right with the pressure.  He says that it was blowing high pressure all the time.  He went back to using his old device but it it very old and he wants a new device. He tolerates the mask and feels the pressure is adequate.  He has bad mouth dryness with the FFM and has tried to adjust the humidity.  He is running out of water in his water chamber at night.  He denies any significant  nasal dryness or nasal congestion.  He does not think that he snores.    He is here today for followup and is doing well.  He recently has been having exertional Chest tightness across his chest with some radiation into his shoulder.  It is very random but exertional and there is no associated sx of nausea, diaphoresis or SOB. He denies any SOB, DOE, PND, orthopnea, LE edema, dizziness, palpitations or syncope.  He is compliant with his meds and is tolerating meds with no SE.    Past Medical History:  Diagnosis Date   Adenomatous polyp    Dr Matthias Hughs   Allergic rhinitis    ED (erectile dysfunction)    Fatty liver    Gastritis    GERD (gastroesophageal reflux disease)    mild   H/O: duodenal ulcer 1982   Hypercholesteremia    Hypertension    Hypogonadism male    Hypothyroidism    Lumbar disc disease    L5--Dr Juliene Pina   Microvascular angina (HCC)    Obesity (BMI 30-39.9)    OSA (obstructive sleep apnea)    AHI 40/hr now on CPAP 9cm H2O   Prediabetes     Past Surgical History:  Procedure Laterality Date   colonscopy  10/2015   [facility]   LUMBAR LAMINECTOMY  09/28/2011   Procedure: MICRODISCECTOMY LUMBAR LAMINECTOMY;  Surgeon: Jacki Cones;  Location: [facility];  Service: Orthopedics;  Laterality: Left;  Hemi-Laminectomy, Microdiscectomy  Lumbar 5 - Sacral 1 Left     NASAL FRACTURE SURGERY  10/1971   NECK SURGERY  2014   OTHER SURGICAL HISTORY  1972   nasal surgery    OTHER SURGICAL HISTORY  2011   right trigger  finger surgey    right hand surgery     remove knots   RIGHT/LEFT HEART CATH AND CORONARY ANGIOGRAPHY N/A 11/13/2019   Procedure: RIGHT/LEFT HEART CATH AND CORONARY ANGIOGRAPHY;  Surgeon: Lyn Records, MD;  Location: [facility];  Service: Cardiovascular;  Laterality: N/A;   VASECTOMY      Current Medications: Current Meds  Medication Sig   amLODipine (NORVASC) 5 MG tablet Take 1 tablet (5 mg total) by mouth daily.   aspirin EC 81 MG tablet Take 1 tablet (81 mg total) by mouth daily. Swallow whole.   furosemide (LASIX) 20 MG tablet Take 1 tablet (20 mg total) by mouth daily.   icosapent Ethyl ([name]) 1 g capsule Take 2 capsules (2 g total) by mouth 2 (two) times daily.   levothyroxine (SYNTHROID) 112 MCG tablet Take 112 mcg by mouth every morning.   nitroGLYCERIN (NITROSTAT) 0.4 MG SL tablet Place 1 tablet (0.4 mg total) under the tongue every 5 (five)  minutes x 3 doses as needed for chest pain.   omeprazole (PRILOSEC) 40 MG capsule Take 40 mg by mouth every morning.   rosuvastatin (CRESTOR) 20 MG tablet Take 1 tablet by mouth daily.   WEGOVY 0.25 MG/0.5ML SOAJ Inject 0.25 mg into the skin.   Current Facility-Administered Medications for the 01/16/24 encounter (Clinical Support) with Quintella Reichert, MD  Medication   triamcinolone acetonide (KENALOG) 10 MG/ML injection 10 mg     Allergies:   Penicillins and Lipitor [atorvastatin]   Social History   Socioeconomic History   Marital status: Married    Spouse name: Not on file   Number of children: 3   Years of education: Not on file   Highest education level: Not on file  Occupational History   Occupation: school bus driver/retired  Tobacco Use   Smoking status: Former    Current packs/day: 0.00    Average packs/day: 2.0 packs/day for 20.0 years (40.0 ttl pk-yrs)    Types: Cigarettes    Start date: 11/07/1972    Quit date: 11/07/1992    Years since quitting: 31.2   Smokeless tobacco: Never  Vaping Use   Vaping status: Never Used  Substance and Sexual Activity   Alcohol use: Yes    Comment: rare   Drug use: No   Sexual activity: Not on file  Other Topics Concern   Not on file  Social History Narrative   Not on file   Social Drivers of Health   Financial Resource Strain: Not on file  Food Insecurity: Not on file  Transportation Needs: Not on file  Physical Activity: Not on file  Stress: Not on file  Social Connections: Not on file     Family History: The patient's family history includes CAD in his father and paternal grandfather; Hypertension in his brother and mother; Peripheral vascular disease in his brother and father; Sleep apnea in his father; Thyroid disease in his father. There is no history of Colon cancer, Liver cancer, Esophageal cancer, Stomach cancer, or Rectal cancer.  ROS:   Please see the history of present illness.    Review of Systems   Musculoskeletal:  Negative for muscle weakness.    All other systems reviewed and negative.   EKGs/Labs/Other Studies Reviewed:    The following studies were reviewed today: Lexiscan Myoview        Recent Labs: No results found for requested labs within last 365 days.   Recent Lipid Panel    Component Value  Date/Time   CHOL 125 03/18/2022 1000   TRIG 205 (H) 03/18/2022 1000   HDL 32 (L) 03/18/2022 1000   CHOLHDL 3.9 03/18/2022 1000   CHOLHDL 7.3 11/13/2019 0548   VLDL 28 11/13/2019 0548   LDLCALC 59 03/18/2022 1000    Physical Exam:    VS:  BP 122/82   Pulse 83   Ht 5\' 10"  (1.778 m)   Wt 240 lb 12.8 oz (109.2 kg)   SpO2 96%   BMI 34.55 kg/m     Wt Readings from Last 3 Encounters:  01/16/24 240 lb 12.8 oz (109.2 kg)  05/04/23 232 lb (105.2 kg)  01/28/22 245 lb (111.1 kg)    GEN: Well nourished, well developed in no acute distress HEENT: Normal NECK: No JVD; No carotid bruits LYMPHATICS: No lymphadenopathy CARDIAC:RRR, no murmurs, rubs, gallops RESPIRATORY:  Clear to auscultation without rales, wheezing or rhonchi  ABDOMEN: Soft, non-tender, non-distended MUSCULOSKELETAL:  No edema; No deformity  SKIN: Warm and dry NEUROLOGIC:  Alert and oriented x 3 PSYCHIATRIC:  Normal affect  ASSESSMENT:    1. CAD in native artery   2. Essential hypertension, benign   3. Mixed hyperlipidemia   4. OSA on CPAP     PLAN:    In order of problems listed above:  1. Chronic stable angina/ASCAD -his chronic CP has been felt to no be angina and likely due to GERD with esophageal spasm. -His last cath in January 2021 showed less than 50% nonobstructive disease of the LAD and RCA.  It was felt that he possibly had microvascular disease as there was slight reduction in TIMI flow in the LAD. Eugenie Birks Myoview 12/22 showed no ischemia -he recently has been having exertional angina -Continue prescription drug management with amlodipine 5 mg daily, aspirin 81 mg daily,  [name] 2 g twice daily and Crestor 20 mg daily with as needed refills -I will get a Stress PET CT to rule out Ischemia -Informed Consent   Shared Decision Making/Informed Consent The risks [chest pain, shortness of breath, cardiac arrhythmias, dizziness, blood pressure fluctuations, myocardial infarction, stroke/transient ischemic attack, nausea, vomiting, allergic reaction, radiation exposure, metallic taste sensation and life-threatening complications (estimated to be 1 in 10,000)], benefits (risk stratification, diagnosing coronary artery disease, treatment guidance) and alternatives of a cardiac PET stress test were discussed in detail with Mr. Bitner and he agrees to proceed.     2. HTN -BP controlled on exam today -Continue drug managed with amlodipine 5 mg daily with as needed refills  3.  HLD -LDL goal < 70 -check FLP and ALT -Continue prescription managed with Crestor 20 mg daily and [name] 2 g twice daily with as needed refills  4.  OSA - The patient is tolerating PAP therapy well without any problems. The PAP download performed by his DME was personally reviewed and interpreted by me today and showed an AHI of 0.8 /hr on auto Pap in 4-12 cm H2O with 87 % compliance in using more than 4 hours nightly.  The patient has been using and benefiting from PAP use and will continue to benefit from therapy.  -his newer device which is >52 years old has not been working with the pressure and now using his old device which is very old -I will order him a new ResMed auto CPAP from 4 to 12cm H2O with heated humidity and mask of choice   Medication Adjustments/Labs and Tests Ordered: Current medicines are reviewed at length with the patient today.  Concerns  regarding medicines are outlined above.  No orders of the defined types were placed in this encounter.   No orders of the defined types were placed in this encounter.    Signed, Armanda Magic, MD  01/16/2024 10:54 AM    [facility]

## 2024-01-17 DIAGNOSIS — E782 Mixed hyperlipidemia: Secondary | ICD-10-CM | POA: Diagnosis not present

## 2024-01-18 ENCOUNTER — Telehealth: Payer: Self-pay | Admitting: Cardiology

## 2024-01-18 DIAGNOSIS — E782 Mixed hyperlipidemia: Secondary | ICD-10-CM

## 2024-01-18 LAB — LIPID PANEL
Chol/HDL Ratio: 3.9 ratio (ref 0.0–5.0)
Cholesterol, Total: 134 mg/dL (ref 100–199)
HDL: 34 mg/dL — ABNORMAL LOW (ref >39)
LDL Chol Calc (NIH): 75 mg/dL (ref 0–99)
Triglycerides: 142 mg/dL (ref 0–149)
VLDL Cholesterol Cal: 25 mg/dL (ref 5–40)

## 2024-01-18 LAB — ALT: ALT: 17 IU/L (ref 0–44)

## 2024-01-18 MED ORDER — ROSUVASTATIN CALCIUM 40 MG PO TABS: 40.0000 mg | ORAL_TABLET | Freq: Every day | ORAL | 3 refills | Status: AC

## 2024-01-18 NOTE — Telephone Encounter (Signed)
 Follow Up:        Patient is returning call from today, concerning his results.

## 2024-01-18 NOTE — Telephone Encounter (Signed)
 Spoke with patient and discussed Dr. Norris Cross recommendation to increase Crestor to 40 mg daily and repeat FLP and ALT in 6 weeks.  New dose of Crestor sent to [facility]. FLP and ALT ordered and released to [facility].  Patient verbalized understanding and expressed appreciation for callback.

## 2024-01-23 NOTE — Addendum Note (Signed)
 Addended by: Quintella Reichert on: 01/23/2024 08:15 AM   Modules accepted: Orders

## 2024-01-25 ENCOUNTER — Other Ambulatory Visit: Payer: Self-pay

## 2024-01-25 DIAGNOSIS — I10 Essential (primary) hypertension: Secondary | ICD-10-CM

## 2024-01-25 DIAGNOSIS — I2510 Atherosclerotic heart disease of native coronary artery without angina pectoris: Secondary | ICD-10-CM

## 2024-01-25 DIAGNOSIS — R7303 Prediabetes: Secondary | ICD-10-CM

## 2024-01-25 DIAGNOSIS — E782 Mixed hyperlipidemia: Secondary | ICD-10-CM

## 2024-01-25 DIAGNOSIS — G4733 Obstructive sleep apnea (adult) (pediatric): Secondary | ICD-10-CM

## 2024-01-25 NOTE — Progress Notes (Signed)
 Order for new ResMed auto CPAP from 4 to 12cm H2O with heated humidity and mask of choice sent to [facility] today.

## 2024-02-12 DIAGNOSIS — E669 Obesity, unspecified: Secondary | ICD-10-CM | POA: Diagnosis not present

## 2024-02-12 DIAGNOSIS — E785 Hyperlipidemia, unspecified: Secondary | ICD-10-CM | POA: Diagnosis not present

## 2024-02-12 DIAGNOSIS — I10 Essential (primary) hypertension: Secondary | ICD-10-CM | POA: Diagnosis not present

## 2024-02-12 DIAGNOSIS — I700 Atherosclerosis of aorta: Secondary | ICD-10-CM | POA: Diagnosis not present

## 2024-02-25 ENCOUNTER — Emergency Department (HOSPITAL_BASED_OUTPATIENT_CLINIC_OR_DEPARTMENT_OTHER)

## 2024-02-25 ENCOUNTER — Encounter (HOSPITAL_BASED_OUTPATIENT_CLINIC_OR_DEPARTMENT_OTHER): Payer: Self-pay | Admitting: Emergency Medicine

## 2024-02-25 ENCOUNTER — Emergency Department (HOSPITAL_BASED_OUTPATIENT_CLINIC_OR_DEPARTMENT_OTHER)
Admission: EM | Admit: 2024-02-25 | Discharge: 2024-02-26 | Disposition: A | Discharge status: Home / Self Care | Attending: Emergency Medicine | Admitting: Emergency Medicine

## 2024-02-25 DIAGNOSIS — R1013 Epigastric pain: Secondary | ICD-10-CM | POA: Insufficient documentation

## 2024-02-25 DIAGNOSIS — I10 Essential (primary) hypertension: Secondary | ICD-10-CM | POA: Diagnosis not present

## 2024-02-25 DIAGNOSIS — Z79899 Other long term (current) drug therapy: Secondary | ICD-10-CM | POA: Insufficient documentation

## 2024-02-25 DIAGNOSIS — K5903 Drug induced constipation: Secondary | ICD-10-CM

## 2024-02-25 DIAGNOSIS — K8689 Other specified diseases of pancreas: Secondary | ICD-10-CM | POA: Diagnosis not present

## 2024-02-25 DIAGNOSIS — R079 Chest pain, unspecified: Secondary | ICD-10-CM | POA: Diagnosis not present

## 2024-02-25 DIAGNOSIS — Z7982 Long term (current) use of aspirin: Secondary | ICD-10-CM | POA: Insufficient documentation

## 2024-02-25 DIAGNOSIS — R0789 Other chest pain: Secondary | ICD-10-CM | POA: Diagnosis not present

## 2024-02-25 DIAGNOSIS — K56609 Unspecified intestinal obstruction, unspecified as to partial versus complete obstruction: Secondary | ICD-10-CM | POA: Diagnosis not present

## 2024-02-25 DIAGNOSIS — K5730 Diverticulosis of large intestine without perforation or abscess without bleeding: Secondary | ICD-10-CM | POA: Diagnosis not present

## 2024-02-25 DIAGNOSIS — R112 Nausea with vomiting, unspecified: Secondary | ICD-10-CM | POA: Diagnosis not present

## 2024-02-25 LAB — HEPATIC FUNCTION PANEL
ALT: 22 U/L (ref 0–44)
AST: 20 U/L (ref 15–41)
Albumin: 5.2 g/dL — ABNORMAL HIGH (ref 3.5–5.0)
Alkaline Phosphatase: 55 U/L (ref 38–126)
Bilirubin, Direct: 0.2 mg/dL (ref 0.0–0.2)
Indirect Bilirubin: 0.7 mg/dL (ref 0.3–0.9)
Total Bilirubin: 0.9 mg/dL (ref 0.0–1.2)
Total Protein: 8.2 g/dL — ABNORMAL HIGH (ref 6.5–8.1)

## 2024-02-25 LAB — BASIC METABOLIC PANEL WITH GFR
Anion gap: 10 (ref 5–15)
BUN: 17 mg/dL (ref 8–23)
CO2: 25 mmol/L (ref 22–32)
Calcium: 10.1 mg/dL (ref 8.9–10.3)
Chloride: 102 mmol/L (ref 98–111)
Creatinine, Ser: 1 mg/dL (ref 0.61–1.24)
GFR, Estimated: >60 mL/min (ref >60)
Glucose, Bld: 118 mg/dL — ABNORMAL HIGH (ref 70–99)
Potassium: 4.2 mmol/L (ref 3.5–5.1)
Sodium: 137 mmol/L (ref 135–145)

## 2024-02-25 LAB — CBC
HCT: 45.3 % (ref 39.0–52.0)
Hemoglobin: 15.2 g/dL (ref 13.0–17.0)
MCH: 30.1 pg (ref 26.0–34.0)
MCHC: 33.6 g/dL (ref 30.0–36.0)
MCV: 89.7 fL (ref 80.0–100.0)
Platelets: 175 10*3/uL (ref 150–400)
RBC: 5.05 MIL/uL (ref 4.22–5.81)
RDW: 13.2 % (ref 11.5–15.5)
WBC: 9 10*3/uL (ref 4.0–10.5)
nRBC: 0 % (ref 0.0–0.2)

## 2024-02-25 LAB — LIPASE, BLOOD: Lipase: 23 U/L (ref 11–51)

## 2024-02-25 LAB — TROPONIN I (HIGH SENSITIVITY): Troponin I (High Sensitivity): 2 ng/L (ref <18)

## 2024-02-25 MED ORDER — ALUM & MAG HYDROXIDE-SIMETH 200-200-20 MG/5ML PO SUSP
30.0000 mL | Freq: Once | ORAL | Status: AC
  Administered 2024-02-25: 30 mL via ORAL
  Filled 2024-02-25: qty 30

## 2024-02-25 MED ORDER — MORPHINE SULFATE (PF) 4 MG/ML IV SOLN
4.0000 mg | Freq: Once | INTRAVENOUS | Status: AC
  Administered 2024-02-25: 4 mg via INTRAVENOUS
  Filled 2024-02-25: qty 1

## 2024-02-25 MED ORDER — ONDANSETRON HCL 4 MG/2ML IJ SOLN
4.0000 mg | Freq: Once | INTRAMUSCULAR | Status: AC
  Administered 2024-02-25: 4 mg via INTRAVENOUS
  Filled 2024-02-25: qty 2

## 2024-02-25 MED ORDER — IOHEXOL 300 MG/ML  SOLN
100.0000 mL | Freq: Once | INTRAMUSCULAR | Status: AC | PRN
  Administered 2024-02-25: 100 mL via INTRAVENOUS

## 2024-02-25 NOTE — ED Triage Notes (Signed)
 Middle chest/ upper abdo pain Started 4 pm Sharp, constant Gas Some sob Nausea vomiting Headache  Started wegovy last week

## 2024-02-25 NOTE — ED Notes (Addendum)
 Pt took 2 laxatives earlier today. He is now in bathroom and had successful BM

## 2024-02-25 NOTE — ED Provider Notes (Signed)
 [place] EM[facility]GENC[facility] DEPARTMENT AT [facility] Provider Note   CSN: [id number] Arrival date & time: 02/25/24  2126     History  Chief Complaint  Patient presents with   Abdominal Pain   Chest Pain    Martin Hopkins is a 73 y.o. male.  The history is provided by the patient and medical records. No language interpreter was used.  Abdominal Pain Associated symptoms: chest pain   Chest Pain Associated symptoms: abdominal pain      73 year old male history of hypertension, obesity, anxiety, hyperlipidemia, GERD presenting with complaint of chest pain.  Patient report he felt bit constipated this morning.  He tries multiple Dulcolax as well as an enema and he was able to produce small bowel movement and able to pass flatus but he endorsed having discomfort to his chest since 4 PM today.  Described as a uncomfortable sensation to his mid chest with some nausea and indigestion.  He did vomit once, endorsed minimal shortness of breath.  Does not endorse any lightheadedness or dizziness or diaphoresis.  Denies any back pain.  Denies any productive cough hemoptysis.  Reportedly started on Wegovy several weeks ago with the lowest dose and did not notice any symptoms.  His doctor increased his dose at the beginning of this week and he has been doing fine.  Patient report his last heart cath was in 2021 and he was told that he had 1 nonobstructive coronary vessel at 50%.  Home Medications Prior to Admission medications   Medication Sig Start Date End Date Taking? Authorizing Provider  amLODipine  (NORVASC ) 5 MG tablet Take 1 tablet (5 mg total) by mouth daily. 10/06/21   Jacqueline Matsu, MD  aspirin  EC 81 MG tablet Take 1 tablet (81 mg total) by mouth daily. Swallow whole. 05/04/23   Jacqueline Matsu, MD  furosemide  (LASIX ) 20 MG tablet Take 1 tablet (20 mg total) by mouth daily. 11/25/19   Jacqueline Matsu, MD  icosapent  Ethyl ([name] ) 1 g capsule Take 2 capsules (2 g total) by mouth 2 (two)  times daily. 10/14/21   Jacqueline Matsu, MD  levothyroxine  (SYNTHROID ) 112 MCG tablet Take 112 mcg by mouth every morning.    [provider]  nitroGLYCERIN  (NITROSTAT ) 0.4 MG SL tablet Place 1 tablet (0.4 mg total) under the tongue every 5 (five) minutes x 3 doses as needed for chest pain. 11/13/19   [facility], Warren Haber, PA  omeprazole (PRILOSEC) 40 MG capsule Take 40 mg by mouth every morning. 11/20/19   [provider]  rosuvastatin  (CRESTOR ) 40 MG tablet Take 1 tablet (40 mg total) by mouth daily. 01/18/24   Jacqueline Matsu, MD  WEGOVY 0.25 MG/0.5ML SOAJ Inject 0.25 mg into the skin. 10/19/23   [provider]      Allergies    Penicillins and Lipitor [atorvastatin]    Review of Systems   Review of Systems  Cardiovascular:  Positive for chest pain.  Gastrointestinal:  Positive for abdominal pain.  All other systems reviewed and are negative.   Physical Exam Updated Vital Signs BP 128/76   Pulse 85   Temp 97.8 F (36.6 C) (Oral)   Resp 15   SpO2 94%  Physical Exam Constitutional:      General: He is not in acute distress.    Appearance: He is well-developed. He is obese.  HENT:     Head: Atraumatic.  Eyes:     Conjunctiva/sclera: Conjunctivae normal.  Cardiovascular:  Rate and Rhythm: Normal rate and regular rhythm.  Pulmonary:     Effort: Pulmonary effort is normal.     Breath sounds: Normal breath sounds. No wheezing, rhonchi or rales.  Abdominal:     General: Abdomen is protuberant.     Palpations: Abdomen is soft.     Tenderness: There is abdominal tenderness in the epigastric area.     Hernia: No hernia is present.  Musculoskeletal:     Cervical back: Normal range of motion and neck supple.     Right lower leg: No edema.     Left lower leg: No edema.  Skin:    Findings: No rash.  Neurological:     Mental Status: He is alert.     ED Results / Procedures / Treatments   Labs (all labs ordered are listed, but only abnormal results  are displayed) Labs Reviewed  CBC  BASIC METABOLIC PANEL WITH GFR  HEPATIC FUNCTION PANEL  LIPASE, BLOOD  TROPONIN I (HIGH SENSITIVITY)    EKG EKG Interpretation Date/Time:  Sunday February 25 2024 21:33:22 EDT Ventricular Rate:  89 PR Interval:  187 QRS Duration:  101 QT Interval:  371 QTC Calculation: 452 R Axis:   75  Text Interpretation: Sinus rhythm Baseline wander in lead(s) V6 No significant change since prior 1/24 Confirmed by Racheal Buddle [phone]) on 02/25/2024 9:37:49 PM  Radiology DG Chest Port 1 View Result Date: 02/25/2024 CLINICAL DATA:  Chest pain. EXAM: PORTABLE CHEST 1 VIEW COMPARISON:  October 31, 2023 FINDINGS: The heart size and mediastinal contours are within normal limits. Both lungs are clear. Postoperative changes are seen within the lower cervical spine. The visualized skeletal structures are unremarkable. IMPRESSION: No active disease. Electronically Signed   By: Virgle Grime M.D.   On: 02/25/2024 22:44    Procedures Procedures    Medications Ordered in ED Medications  alum & mag hydroxide-simeth (MAALOX/MYLANTA) 200-200-20 MG/5ML suspension 30 mL (30 mLs Oral Given 02/25/24 2237)  morphine  (PF) 4 MG/ML injection 4 mg (4 mg Intravenous Given 02/25/24 2252)  ondansetron  (ZOFRAN ) injection 4 mg (4 mg Intravenous Given 02/25/24 2252)    ED Course/ Medical Decision Making/ A&P Clinical Course as of 02/25/24 2255  Sun Feb 25, 2024  286 73 year old male here with upper abdominal lower chest discomfort belching nausea vomiting that started about 4 hours ago.  No prior symptoms similar.  Said he feels a little constipated and tried a laxative.  EKG unremarkable.  Getting lab work and may possibly need some imaging.  Disposition per results of testing. [MB]    Clinical Course User Index [MB] Tonya Fredrickson, MD             HEART Score: 3                    Medical Decision Making Amount and/or Complexity of Data Reviewed Labs:  ordered. Radiology: ordered.  Risk OTC drugs. Prescription drug management.   BP 128/76   Pulse 85   Temp 97.8 F (36.6 C) (Oral)   Resp 15   SpO2 94%   37:81 PM  73 year old male history of hypertension, obesity, anxiety, hyperlipidemia, GERD presenting with complaint of chest pain.  Patient report he felt bit constipated this morning.  He tries multiple Dulcolax as well as an enema and he was able to produce small bowel movement and able to pass flatus but he endorsed having discomfort to his chest since 4 PM today.  Described as a  uncomfortable sensation to his mid chest with some nausea and indigestion.  He did vomit once, endorsed minimal shortness of breath.  Does not endorse any lightheadedness or dizziness or diaphoresis.  Denies any back pain.  Denies any productive cough hemoptysis.  Reportedly started on Wegovy several weeks ago with the lowest dose and did not notice any symptoms.  His doctor increased his dose at the beginning of this week and he has been doing fine.  Patient report his last heart cath was in 2021 and he was told that he had 1 nonobstructive coronary vessel at 50%.  On exam this is an elderly obese male resting comfortably in bed appears to be in no acute discomfort.  Heart with normal rate rhythm, lungs are clear to auscultation abdomen is distended but nontender bowel sounds present.  EMR review patient last cardiac stress test 11/05/2021 was normal.  -Labs ordered, independently viewed and interpreted by me.  Labs remarkable for normal WBC, normal H&H.  CMP, trop and lipase are pending -The patient was maintained on a cardiac monitor.  I personally viewed and interpreted the cardiac monitored which showed an underlying rhythm of: NSR -Imaging including CXR along with abd/pelvis CT ordered but have not resulted yet -This patient presents to the ED for concern of chest/abd pain, this involves an extensive number of treatment options, and is a complaint that  carries with it a high risk of complications and morbidity.  The differential diagnosis includes gastritis, GERD, cholecystitis, pancreatitis, colitis, appendicitis, diverticulitis, MI, PE, PNA -Co morbidities that complicate the patient evaluation includes GERD, HTN, gastritis, CAD, prediabetes -Treatment includes morphine , zofran , GI cocktail -Reevaluation of the patient after these medicines showed that the patient improved -PCP office notes or outside notes reviewed -Discussion with oncoming team Dr. Luberta Ruse who will f/u on CT result along with delta trop -Escalation to admission/observation considered: dispo pending        Final Clinical Impression(s) / ED Diagnoses Final diagnoses:  None    Rx / DC Orders ED Discharge Orders     None         Debbra Fairy, PA-C 02/25/24 2305    Tonya Fredrickson, MD 02/26/24 1610

## 2024-02-25 NOTE — ED Notes (Signed)
Pt reports no change in pain since GI cocktail

## 2024-02-25 NOTE — ED Provider Notes (Addendum)
 11:02 PM Assumed care from Dr. Randal Bury & Debbra Fairy PA, please see their note for full history, physical and decision making until this point. In brief this is a 73 y.o. year old male who presented to the ED tonight with Abdominal Pain and Chest Pain     Epigastric pain with some chest pain. Some nausea, dyspnea, vomiting, HA. Pending workup, reevaluation and disposition.   CT w/ SBO. Query if it's actually ileus? However there's a sharp transition noted by radiology so will assume it's new, especially with the bloating, vomiting and decreased BM recently. D/w Dr. Brock Canner for admission.   Patient reportedly with a BM earlier this AM. Reeval still with mild bloating feeling. Still not intersted in NG tube. Will order repeat labs/KUB in a few hours, may be able to do a PO challenge and d/c from here if continuing to have BM's and tolerating PO.   Morning labs ordered. Vitally stable. Feels well.   Care transferred pendign PO challenge reeval for symptoms. Labs stable/reassuring. Likely d/c.   Labs, studies and imaging reviewed by myself and considered in medical decision making if ordered. Imaging interpreted by radiology.  Labs Reviewed  CBC  BASIC METABOLIC PANEL WITH GFR  HEPATIC FUNCTION PANEL  LIPASE, BLOOD  TROPONIN I (HIGH SENSITIVITY)  TROPONIN I (HIGH SENSITIVITY)    DG Chest Port 1 View  Final Result    CT ABDOMEN PELVIS W CONTRAST    (Results Pending)    No follow-ups on file.    Tanazia Achee, Reymundo Caulk, MD 02/26/24 2841    Eve Hinders, MD 02/26/24 [phone]

## 2024-02-26 DIAGNOSIS — K56609 Unspecified intestinal obstruction, unspecified as to partial versus complete obstruction: Secondary | ICD-10-CM | POA: Diagnosis present

## 2024-02-26 LAB — BASIC METABOLIC PANEL WITH GFR
Anion gap: 7 (ref 5–15)
BUN: 19 mg/dL (ref 8–23)
CO2: 25 mmol/L (ref 22–32)
Calcium: 9.3 mg/dL (ref 8.9–10.3)
Chloride: 104 mmol/L (ref 98–111)
Creatinine, Ser: 1.04 mg/dL (ref 0.61–1.24)
GFR, Estimated: >60 mL/min (ref >60)
Glucose, Bld: 100 mg/dL — ABNORMAL HIGH (ref 70–99)
Potassium: 4.8 mmol/L (ref 3.5–5.1)
Sodium: 136 mmol/L (ref 135–145)

## 2024-02-26 LAB — CBC WITH DIFFERENTIAL/PLATELET
Abs Immature Granulocytes: 0.02 10*3/uL (ref 0.00–0.07)
Basophils Absolute: 0 10*3/uL (ref 0.0–0.1)
Basophils Relative: 0 %
Eosinophils Absolute: 0.2 10*3/uL (ref 0.0–0.5)
Eosinophils Relative: 3 %
HCT: 41 % (ref 39.0–52.0)
Hemoglobin: 13.7 g/dL (ref 13.0–17.0)
Immature Granulocytes: 0 %
Lymphocytes Relative: 25 %
Lymphs Abs: 1.8 10*3/uL (ref 0.7–4.0)
MCH: 29.9 pg (ref 26.0–34.0)
MCHC: 33.4 g/dL (ref 30.0–36.0)
MCV: 89.5 fL (ref 80.0–100.0)
Monocytes Absolute: 0.6 10*3/uL (ref 0.1–1.0)
Monocytes Relative: 9 %
Neutro Abs: 4.4 10*3/uL (ref 1.7–7.7)
Neutrophils Relative %: 63 %
Platelets: 167 10*3/uL (ref 150–400)
RBC: 4.58 MIL/uL (ref 4.22–5.81)
RDW: 13.4 % (ref 11.5–15.5)
WBC: 7 10*3/uL (ref 4.0–10.5)
nRBC: 0 % (ref 0.0–0.2)

## 2024-02-26 LAB — TROPONIN I (HIGH SENSITIVITY): Troponin I (High Sensitivity): 2 ng/L (ref <18)

## 2024-02-26 LAB — LACTIC ACID, PLASMA: Lactic Acid, Venous: 0.6 mmol/L (ref 0.5–1.9)

## 2024-02-26 MED ORDER — POLYETHYLENE GLYCOL 3350 17 G PO PACK
17.0000 g | PACK | Freq: Every day | ORAL | 0 refills | Status: DC | PRN
Start: 2024-02-26 — End: 2024-03-08

## 2024-02-26 MED ORDER — ONDANSETRON 4 MG PO TBDP: 4.0000 mg | ORAL_TABLET | Freq: Three times a day (TID) | ORAL | 0 refills | Status: AC | PRN

## 2024-02-26 MED ORDER — DOCUSATE SODIUM 100 MG PO CAPS: 100.0000 mg | ORAL_CAPSULE | Freq: Two times a day (BID) | ORAL | 0 refills | Status: AC

## 2024-02-26 MED ORDER — METAMUCIL SMOOTH TEXTURE 58.6 % PO POWD: 1.0000 | Freq: Three times a day (TID) | ORAL | 12 refills | Status: DC

## 2024-02-26 NOTE — Discharge Instructions (Addendum)
 As discussed, slowly reintroduce foods.  Return immediately felt fevers, chills, worsening abdominal pain with return of symptoms.  He may also return if develop any new or worsening symptoms that are concerning to you.

## 2024-02-26 NOTE — ED Notes (Signed)
 Pt had another successful BM

## 2024-02-26 NOTE — ED Notes (Signed)
 Pt given discharge instructions and reviewed prescriptions. Opportunities given for questions. Pt verbalizes understanding. PIV removed x1. Jillyn Hidden, RN

## 2024-02-26 NOTE — ED Notes (Signed)
 Pt continues to deny pain and nausea. Had another BM. States he "feels like a million bucks".

## 2024-02-26 NOTE — Progress Notes (Signed)
 Hospitalist Transfer Note:    Nursing staff, Please call [facility] Admits & Consults System-Wide number on Amion [phone]) as soon as patient's arrival, so appropriate admitting provider can evaluate the pt.   Transferring facility: [facility] Requesting provider: Dr. Luberta Ruse (EDP at [facility]) Reason for transfer: admission for further evaluation and management of SBO.    39 M who presented to [facility] ED complaining of 1 to 2 days of nausea associated with nonbloody, nonbilious emesis, abdominal discomfort with distention.  Additionally, he has had no significant bowel movement since Thursday, 02/22/2024, and notes diminished flatus production.  He attempted to take suppositories at home earlier today, with very small stool output following this measure.  Is started [facility] a few weeks ago, and went up on his dose on Monday, 02/19/2024  CT abdomen/pelvis is suggestive of small bowel striction with transition point.  NGT is being placed at [facility] at this time.    Subsequently, I accepted this patient for transfer for inpatient admission to a med-surg bed at [facility] or [facility] (first available) for further work-up and management of the above.       Camelia Cavalier, DO Hospitalist

## 2024-02-26 NOTE — ED Provider Notes (Signed)
 Received signout from Dr. Luberta Ruse; patient admitted for possible SBO.  However, symptoms have seemingly resolved and having bowel movements here in the emergency department.  Repeat labs ordered by Dr. Luberta Ruse reviewed and are reassuring.  He is tolerating p.o. here in the emergency department.  He has a benign abdominal exam for me.  Given improvement of symptoms, do not feel that he would benefit from admission at this time.  Shared decision making with patient, he would like to go home and will return promptly if symptoms return.  Discussed slow reintroduction of foods.  Will discharge at this time.   Martin Climes, DO 02/26/24 [phone]

## 2024-02-26 NOTE — ED Notes (Signed)
 Pt had a large BM and reports he feels a lot better and feels hungry. Reminded him of his NPO status for now.

## 2024-02-26 NOTE — ED Notes (Addendum)
 Pt has no pain or nausea. Discussed with EDP and patient NG tube. He is desiring to wait on NG tube for now. Is agreeable is symptoms return. Pt passing gas and having BMs. Belly does feel taut but palpating it does not cause pain. EDP informed

## 2024-03-06 ENCOUNTER — Encounter: Payer: Self-pay | Admitting: Gastroenterology

## 2024-03-06 ENCOUNTER — Ambulatory Visit (INDEPENDENT_AMBULATORY_CARE_PROVIDER_SITE_OTHER)
Admission: RE | Admit: 2024-03-06 | Discharge: 2024-03-06 | Disposition: A | Discharge status: Home / Self Care | Source: Ambulatory Visit | Attending: Gastroenterology

## 2024-03-06 ENCOUNTER — Telehealth: Payer: Self-pay | Admitting: Gastroenterology

## 2024-03-06 DIAGNOSIS — R109 Unspecified abdominal pain: Secondary | ICD-10-CM

## 2024-03-06 DIAGNOSIS — R194 Change in bowel habit: Secondary | ICD-10-CM | POA: Diagnosis not present

## 2024-03-06 DIAGNOSIS — R142 Eructation: Secondary | ICD-10-CM

## 2024-03-06 DIAGNOSIS — R140 Abdominal distension (gaseous): Secondary | ICD-10-CM | POA: Diagnosis not present

## 2024-03-06 DIAGNOSIS — K56609 Unspecified intestinal obstruction, unspecified as to partial versus complete obstruction: Secondary | ICD-10-CM

## 2024-03-06 NOTE — Telephone Encounter (Signed)
 Left message for patient to call back

## 2024-03-06 NOTE — Telephone Encounter (Signed)
 Okay thanks for the update.  If he is not taking any p.o. or hydrating that would be concerning, as he will be dehydrated.  If he cannot tolerating p.o. he needs to go to the ED.  If he is tolerant to p.o., he should be on clear liquid diet right now if he has had recent symptoms.  We can do a x-ray 2 view of the abdomen to assess for presence of obstruction if you can order and he wants to go for an x-ray.  Ultimately if he is not resolving the obstruction and still having pain he needs to go to the hospital.  Further, can you clarify if he has a general surgeon who he follows with?  If he has small bowel obstruction he will need to see a surgeon more so than our clinic.  Thanks

## 2024-03-06 NOTE — Telephone Encounter (Signed)
 Advised patient of recommendations as per Dr General Kenner. Patient states that after he got off the phone with us  earlier, he attempted to drink some water and chicken bullion. States that he feels very full but has been able to keep this liquid down so far. He would like to have abdominal xray at this time. Xray ordered and patient says he will be here within the next hour to have that completed.

## 2024-03-06 NOTE — Telephone Encounter (Signed)
 Spoke to patient who says a couple weeks ago he went to the emergency room for severe lower abdominal pain, bloating, constipation and vomiting. Was told he had an SBO but was discharged prior to hospital admission as he was able to have a bowel movement and felt well enough to leave. Says he was on a liquid diet for several days. He later progressed to solid foods and says that starting Friday, he has again started to have lower abdominal pain, increased gas/bloating and excessive belching. He has not had any vomiting but has been somewhat nauseated. Patient says last bowel movement was today but was extremely small and difficult to evacuate. Patient also says that he has had no PO intake of liquids or foods over the last couple of days.   Patient is scheduled to see Dr General Kenner on 03/08/24 and wonders what he should do in the meantime.  We discussed with his worsening abdominal pain, return of abdominal distention/bloating, excessive belching, inability to have bowel movements and his lack of any PO intake over the last couple of days, he should return to the emergency room. Possibly needs IV fluids and stomach decompression etc. Patient verbalizes understanding. Says if he gets worse, he will go to the ER but otherwise wants to keep his appointment 5/2.  *last GI OV 2021

## 2024-03-06 NOTE — Telephone Encounter (Signed)
 Patient called and stated that he is feeling like he is having intestinal blockage. Patient is requesting a call back from the nurse. Please advise.

## 2024-03-08 ENCOUNTER — Ambulatory Visit: Admitting: Gastroenterology

## 2024-03-08 ENCOUNTER — Encounter: Payer: Self-pay | Admitting: Gastroenterology

## 2024-03-08 VITALS — BP 124/72 | HR 74 | Ht 70.0 in | Wt 232.0 lb

## 2024-03-08 DIAGNOSIS — K56609 Unspecified intestinal obstruction, unspecified as to partial versus complete obstruction: Secondary | ICD-10-CM | POA: Diagnosis not present

## 2024-03-08 DIAGNOSIS — K5900 Constipation, unspecified: Secondary | ICD-10-CM | POA: Diagnosis not present

## 2024-03-08 DIAGNOSIS — K760 Fatty (change of) liver, not elsewhere classified: Secondary | ICD-10-CM | POA: Diagnosis not present

## 2024-03-08 MED ORDER — POLYETHYLENE GLYCOL 3350 17 G PO PACK: 17.0000 g | PACK | Freq: Every day | ORAL | 0 refills | Status: AC

## 2024-03-08 NOTE — Progress Notes (Signed)
 HPI :  73 year old male known to me, history of colon polyps, fatty liver, remote duodenal ulcers, here to reestablish care for some newer issues I last saw him in January 2021.  Patient was recently seen in the ED on April 21.  He states at home he developed some progressive bloating in his abdomen and discomfort.  He became quite distended, then developed a few episodes of vomiting and went to the ER at [facility].  He had a workup done there, CT scan showed small bowel obstruction, imaging as outlined below.  They were going to admit him however with time he states his symptoms abated, had a large episode of vomiting and felt, then had multiple bowel movements and felt well enough to go home.  Since he has been at home, he has been on a mostly liquid diet.  After few days he states he was feeling pretty well and back to normal.  He did have an episode of pain over the weekend after eating something, had some discomfort just above his umbilicus which was self-limited, but reminded him of his prior discomfort with his obstruction.  He has had some constipation and difficulty moving his bowels.  In the ED they gave him Metamucil to take on a daily basis and he has been taking 2 doses of that since the hospital stay.  He is not having any nausea or vomiting, just some mild intermittent bloating.  He is having bowel movements perhaps every 2 days or so for the past few months.  He thinks his bowels have changed since being on Wegovy.  He has lost about 14 pounds in the past 2 weeks in the setting of his obstruction.  He called in the other day with some bloating, had an x-ray done on 4/30 which was negative for obstruction  He has never had a prior abdominal surgery.  He has never had a bowel obstruction before.  This is very new for him.  He was not evaluated by the surgical team.  He does have a history of known fatty liver.  Denies any alcohol use.  No family history of liver disease or cirrhosis.   Imaging showed fatty liver without any evidence of cirrhosis.  Spleen is normal.  Liver function testing is normal, platelets normal.   Prior workup: EGD 12/02/19: - Esophagogastric landmarks were identified: the Z-line was found at 40 cm, the gastroesophageal junction was found at 40 cm and the upper extent of the gastric folds was found at 40 cm from the incisors. Findings: - The exam of the esophagus was otherwise normal. - Patchy moderate inflammation characterized by erythema and friability was found in the gastric antrum, without focal erosion or ulceration. - The exam of the stomach was otherwise normal. - Biopsies were taken with a cold forceps in the gastric body, at the incisura and in the gastric antrum for Helicobacter pylori testing. - There was benign ectopic gastric mucosa noted in the bulb. The ampulla, duodenal bulb and second portion of the duodenum were normal. Biopsies for histology were taken from the 2nd portion and bulb with a cold forceps for evaluation of celiac disease.  Colonoscopy 12/02/19: - The perianal and digital rectal examinations were normal. - The terminal ileum appeared normal. - Multiple small-mouthed diverticula were found in the entire colon. - A 4 mm polyp was found in the rectum. The polyp was sessile. The polyp was removed with a cold snare. Resection and retrieval were complete. This was removed  during scope insertion at the beginning of the exam. Upon scope withdrawal there was persistent oozing at the site. Snare tip coagulation was applied to the area with good result / immediate hemostasis. - Internal hemorrhoids were found during retroflexion. - The exam was otherwise without abnormality.  1. Surgical [P], duodenal bulb, 2nd portion of duodenum and distal duodenum - PEPTIC DUODENITIS. - NO DYSPLASIA OR MALIGNANCY. 2. Surgical [P], gastric antrum and gastric body - MILD CHRONIC GASTRITIS. - WARTHIN-STARRY IS NEGATIVE FOR HELICOBACTER PYLORI. - NO  INTESTINAL METAPLASIA, DYSPLASIA, OR MALIGNANCY. 3. Surgical [P], colon, rectum, polyp - HYPERPLASTIC POLYP. - NO DYSPLASIA OR MALIGNANCY.   CT abdomen pelvis 02/25/24: IMPRESSION: 1. Small-bowel obstruction with abrupt tapering in the left central abdomen. 2. Hepatic steatosis. 3. Aortic Atherosclerosis (ICD10-I70.0).   Abdominal x ray 03/06/24: IMPRESSION: Negative two view abdomen.   Past Medical History:  Diagnosis Date   Adenomatous polyp    Dr Dellis Fermo   Allergic rhinitis    ED (erectile dysfunction)    Fatty liver    Gastritis    GERD (gastroesophageal reflux disease)    mild   H/O: duodenal ulcer 1982   Hypercholesteremia    Hypertension    Hypogonadism male    Hypothyroidism    Lumbar disc disease    L5--Dr Sander Crooked   Microvascular angina (HCC)    Obesity (BMI 30-39.9)    OSA (obstructive sleep apnea)    AHI 40/hr now on CPAP 9cm H2O   Prediabetes      Past Surgical History:  Procedure Laterality Date   colonscopy  10/2015   [facility]   LUMBAR LAMINECTOMY  09/28/2011   Procedure: MICRODISCECTOMY LUMBAR LAMINECTOMY;  Surgeon: Florencia Hunter;  Location: [facility];  Service: Orthopedics;  Laterality: Left;  Hemi-Laminectomy, Microdiscectomy  Lumbar 5 - Sacral 1 Left     NASAL FRACTURE SURGERY  10/1971   NECK SURGERY  2014   OTHER SURGICAL HISTORY  1972   nasal surgery    OTHER SURGICAL HISTORY  2011   right trigger finger surgey    right hand surgery     remove knots   RIGHT/LEFT HEART CATH AND CORONARY ANGIOGRAPHY N/A 11/13/2019   Procedure: RIGHT/LEFT HEART CATH AND CORONARY ANGIOGRAPHY;  Surgeon: Arty Binning, MD;  Location: [facility];  Service: Cardiovascular;  Laterality: N/A;   VASECTOMY     Family History  Problem Relation Age of Onset   Hypertension Mother    CAD Father    Peripheral vascular disease Father    Thyroid  disease Father    Sleep apnea Father    Peripheral vascular disease Brother    Hypertension Brother    CAD  Paternal Grandfather    Colon cancer Neg Hx    Liver cancer Neg Hx    Esophageal cancer Neg Hx    Stomach cancer Neg Hx    Rectal cancer Neg Hx    Social History   Tobacco Use   Smoking status: Former    Current packs/day: 0.00    Average packs/day: 2.0 packs/day for 20.0 years (40.0 ttl pk-yrs)    Types: Cigarettes    Start date: 11/07/1972    Quit date: 11/07/1992    Years since quitting: 31.3   Smokeless tobacco: Never  Vaping Use   Vaping status: Never Used  Substance Use Topics   Alcohol use: Not Currently    Comment: rare   Drug use: Not Currently    Types: Methamphetamines   Current  Outpatient Medications  Medication Sig Dispense Refill   amLODipine  (NORVASC ) 5 MG tablet Take 1 tablet (5 mg total) by mouth daily. 90 tablet 3   aspirin  EC 81 MG tablet Take 1 tablet (81 mg total) by mouth daily. Swallow whole. 90 tablet 3   docusate sodium  (COLACE) 100 MG capsule Take 1 capsule (100 mg total) by mouth every 12 (twelve) hours. 60 capsule 0   furosemide  (LASIX ) 20 MG tablet Take 1 tablet (20 mg total) by mouth daily. 30 tablet 11   icosapent  Ethyl ([name] ) 1 g capsule Take 2 capsules (2 g total) by mouth 2 (two) times daily. 360 capsule 3   levothyroxine  (SYNTHROID ) 112 MCG tablet Take 112 mcg by mouth every morning.     nitroGLYCERIN  (NITROSTAT ) 0.4 MG SL tablet Place 1 tablet (0.4 mg total) under the tongue every 5 (five) minutes x 3 doses as needed for chest pain. 25 tablet 3   omeprazole (PRILOSEC) 40 MG capsule Take 40 mg by mouth daily as needed.     ondansetron  (ZOFRAN -ODT) 4 MG disintegrating tablet Take 1 tablet (4 mg total) by mouth every 8 (eight) hours as needed for vomiting. 30 tablet 0   polyethylene glycol (MIRALAX  / GLYCOLAX ) 17 g packet Take 17 g by mouth daily as needed for severe constipation. 14 each 0   psyllium (METAMUCIL SMOOTH TEXTURE) 58.6 % powder Take 1 packet by mouth 3 (three) times daily. 283 g 12   rosuvastatin  (CRESTOR ) 40 MG tablet Take 1 tablet  (40 mg total) by mouth daily. 90 tablet 3   WEGOVY 0.25 MG/0.5ML SOAJ Inject 0.25 mg into the skin.     Current Facility-Administered Medications  Medication Dose Route Frequency Provider Last Rate Last Admin   triamcinolone  acetonide (KENALOG ) 10 MG/ML injection 10 mg  10 mg Intramuscular Once        Allergies  Allergen Reactions   Penicillins Shortness Of Breath   Lipitor [Atorvastatin]     Joint pain      Review of Systems: All systems reviewed and negative except where noted in HPI.    DG Abd 2 Views Result Date: 03/06/2024 CLINICAL DATA:  Recent bowel obstruction. Bloating and belching. Abdominal pain. EXAM: ABDOMEN - 2 VIEW COMPARISON:  None Available. FINDINGS: The bowel gas pattern is normal. There is no evidence of free air. No radio-opaque calculi or other significant radiographic abnormality is seen. IMPRESSION: Negative two view abdomen. Electronically Signed   By: Audree Leas M.D.   On: 03/06/2024 15:56   CT ABDOMEN PELVIS W CONTRAST Result Date: 02/26/2024 CLINICAL DATA:  Abdominal pain. Middle chest and upper abdominal pain which is sharp. Nausea and vomiting. Started will go be last week. EXAM: CT ABDOMEN AND PELVIS WITH CONTRAST TECHNIQUE: Multidetector CT imaging of the abdomen and pelvis was performed using the standard protocol following bolus administration of intravenous contrast. RADIATION DOSE REDUCTION: This exam was performed according to the departmental dose-optimization program which includes automated exposure control, adjustment of the mA and/or kV according to patient size and/or use of iterative reconstruction technique. CONTRAST:  OMNIPAQUE  IOHEXOL  300 MG/ML  SOLN COMPARISON:  CT abdomen pelvis 12/30/2019 FINDINGS: Lower chest: No acute abnormality. Hepatobiliary: Hepatic steatosis. Unremarkable gallbladder and biliary tree. Pancreas: Fatty atrophy of the pancreas.  No acute abnormality. Spleen: Unremarkable. Adrenals/Urinary Tract: Normal  adrenal glands. No urinary calculi or hydronephrosis. Unremarkable nondistended bladder. Stomach/Bowel: Mild distention of the stomach. The small bowel is dilated with multiple air-fluid levels. There is abrupt tapering  in the left central abdomen (circa series 5/image [id number]). Normal caliber colon without wall thickening. Colonic diverticulosis without diverticulitis. Normal appendix. Vascular/Lymphatic: Aortic atherosclerosis. No enlarged abdominal or pelvic lymph nodes. Reproductive: Unremarkable. Other: No free intraperitoneal fluid or air. Small fat containing bilateral inguinal hernias. Musculoskeletal: No acute fracture. 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 IMPRESSION: 1. Small-bowel obstruction with abrupt tapering in the left central abdomen. 2. Hepatic steatosis. 3. Aortic Atherosclerosis (ICD10-I70.0). Electronically Signed   By: Rozell Cornet M.D.   On: 02/26/2024 00:00   DG Chest Port 1 View Result Date: 02/25/2024 CLINICAL DATA:  Chest pain. EXAM: PORTABLE CHEST 1 VIEW COMPARISON:  October 31, 2023 FINDINGS: The heart size and mediastinal contours are within normal limits. Both lungs are clear. Postoperative changes are seen within the lower cervical spine. The visualized skeletal structures are unremarkable. IMPRESSION: No active disease. Electronically Signed   By: Virgle Grime M.D.   On: 02/25/2024 22:44   Lab Results  Component Value Date   WBC 7.0 02/26/2024   HGB 13.7 02/26/2024   HCT 41.0 02/26/2024   MCV 89.5 02/26/2024   PLT 167 02/26/2024    Lab Results  Component Value Date   NA 136 02/26/2024   CL 104 02/26/2024   K 4.8 02/26/2024   CO2 25 02/26/2024   BUN 19 02/26/2024   CREATININE 1.04 02/26/2024   GFRNONAA >60 02/26/2024   CALCIUM  9.3 02/26/2024   ALBUMIN 5.2 (H) 02/25/2024   GLUCOSE 100 (H) 02/26/2024    Lab Results  Component Value Date   ALT 22 02/25/2024   AST 20 02/25/2024   ALKPHOS 55 02/25/2024   BILITOT 0.9 02/25/2024      Physical Exam: BP 124/72   Pulse 74   Ht 5\' 10"  (1.778 m)   Wt 232 lb (105.2 kg)   BMI 33.29 kg/m  Constitutional: Pleasant,well-developed, male in no acute distress. HEENT: Normocephalic and atraumatic. Conjunctivae are normal. No scleral icterus. Neck supple.  Cardiovascular: Normal rate, regular rhythm.  Pulmonary/chest: Effort normal and breath sounds normal.  Abdominal: Soft,  protuberant, nontender. There are no masses palpable. No hepatomegaly. Extremities: no edema Lymphadenopathy: No cervical adenopathy noted. Neurological: Alert and oriented to person place and time. Psychiatric: Normal mood and affect. Behavior is normal.   ASSESSMENT: 73 y.o. male here for assessment of the following  1. SBO (small bowel obstruction) (HCC)   2. Constipation, unspecified constipation type   3. Fatty liver    Recent small bowel obstruction when he presented to the ED with abdominal distention, lack of bowel movements, vomiting.  CT showed small bowel obstruction.  This appeared to resolve rather quickly in the ED with some improved symptoms and discharged.  He did well for several days but has had some intermittent bloating and discomfort in the past week or so.  We discussed his bowel obstruction.  He has no surgical history, no prior obstructions, unclear why this occurred.  Recommending an MR enterography to clear small bowel, ensure no mass lesion etc.  He is agreeable to do this.  In the interim recommend he stay on a low residual diet, gave him a handout about this.  I also recommend he completely stop the Metamucil as that can contribute to bloating and distention of his bowel.  He should take MiraLAX  daily to twice daily for constipation otherwise.  If he has any symptoms of overt obstruction in the interim he should stop eating and drinking and go to the  ED for evaluation.  Will await his MR reading with further instruction.  He will hold [facility] for now given his recent  obstruction and worsening bowel function.  Hopefully can resume this in the future once this issue sorted out.  Unclear if this obstruction was related to [facility] or not.  Otherwise we discussed fatty liver, risks for fibrosis/cirrhosis in general.  He has been working on weight loss, as above Frederik Jansky would help with this however holding it for now.  His LFTs and platelets are normal, no evidence of cirrhosis currently.  He needs yearly monitoring of this moving forward.  PLAN: - MR enterography - assess for cause of small bowel obstruction - stop metamucil - start Miralax  daily to BID - low residual diet - hold Wegovy for now, hopefully can resume in the future - counseled on fatty liver, risks for cirrhosis, work on weight loss. LAEs normal  Christi Coward, MD [facility]  CC: Victorio Grave, MD

## 2024-03-08 NOTE — Patient Instructions (Addendum)
 You have been scheduled for an MRI at [facility], 1st floor, Radiology. Your appointment is scheduled on Tuesday, 5-13 at 9:30 am. Please arrive at 8:00 am to drink contrast. Please make certain not to have anything to eat or drink 6 hours prior to your test. In addition, if you have any metal in your body, have a pacemaker or defibrillator, please be sure to let your ordering physician know. This test typically takes 45 minutes to 1 hour to complete. Should you need to reschedule, please call [phone].  Discontinue Metamucil.  We have sent the following medications to your pharmacy for you to pick up at your convenience: Miralax  : Take once to twice a day  We are giving you a low residual diet.  Please hold Wegovy for now.  Thank you for entrusting me with your care and for choosing [facility] Dr. Alvester Johnson     If your blood pressure at your visit was 140/90 or greater, please contact your primary care physician to follow up on this. ______________________________________________________  If you are age 16 or older, your body mass index should be between 23-30. Your Body mass index is 33.29 kg/m. If this is out of the aforementioned range listed, please consider follow up with your Primary Care Provider.  If you are age 29 or younger, your body mass index should be between 19-25. Your Body mass index is 33.29 kg/m. If this is out of the aformentioned range listed, please consider follow up with your Primary Care Provider.  ________________________________________________________  The [facility] providers would like to encourage you to use MYCHART to communicate with providers for non-urgent requests or questions.  Due to long hold times on the telephone, sending your provider a message by [facility] may be a faster and more efficient way to get a response.  Please allow 48 business hours for a response.  Please remember that this is for non-urgent requests.   _______________________________________________________  Due to recent changes in healthcare laws, you may see the results of your imaging and laboratory studies on MyChart before your provider has had a chance to review them.  We understand that in some cases there may be results that are confusing or concerning to you. Not all laboratory results come back in the same time frame and the provider may be waiting for multiple results in order to interpret others.  Please give us  48 hours in order for your provider to thoroughly review all the results before contacting the office for clarification of your results.

## 2024-03-13 ENCOUNTER — Ambulatory Visit (HOSPITAL_COMMUNITY)
Admission: RE | Admit: 2024-03-13 | Discharge: 2024-03-13 | Disposition: A | Discharge status: Home / Self Care | Source: Ambulatory Visit | Attending: Gastroenterology | Admitting: Gastroenterology

## 2024-03-13 DIAGNOSIS — K4020 Bilateral inguinal hernia, without obstruction or gangrene, not specified as recurrent: Secondary | ICD-10-CM | POA: Diagnosis not present

## 2024-03-13 DIAGNOSIS — K56609 Unspecified intestinal obstruction, unspecified as to partial versus complete obstruction: Secondary | ICD-10-CM | POA: Diagnosis not present

## 2024-03-13 DIAGNOSIS — R160 Hepatomegaly, not elsewhere classified: Secondary | ICD-10-CM | POA: Diagnosis not present

## 2024-03-13 DIAGNOSIS — K5730 Diverticulosis of large intestine without perforation or abscess without bleeding: Secondary | ICD-10-CM | POA: Diagnosis not present

## 2024-03-13 MED ORDER — GADOBUTROL 1 MMOL/ML IV SOLN
10.0000 mL | Freq: Once | INTRAVENOUS | Status: AC | PRN
Start: 2024-03-13 — End: 2024-03-13
  Administered 2024-03-13: 10 mL via INTRAVENOUS

## 2024-03-13 MED ORDER — GADOBUTROL 1 MMOL/ML IV SOLN: 10.0000 mL | Freq: Once | INTRAVENOUS | Status: DC | PRN

## 2024-03-14 ENCOUNTER — Encounter: Payer: Self-pay | Admitting: Gastroenterology

## 2024-03-14 DIAGNOSIS — F419 Anxiety disorder, unspecified: Secondary | ICD-10-CM | POA: Diagnosis not present

## 2024-03-14 DIAGNOSIS — E669 Obesity, unspecified: Secondary | ICD-10-CM | POA: Diagnosis not present

## 2024-03-14 DIAGNOSIS — I10 Essential (primary) hypertension: Secondary | ICD-10-CM | POA: Diagnosis not present

## 2024-03-14 DIAGNOSIS — E785 Hyperlipidemia, unspecified: Secondary | ICD-10-CM | POA: Diagnosis not present

## 2024-03-14 DIAGNOSIS — Z125 Encounter for screening for malignant neoplasm of prostate: Secondary | ICD-10-CM | POA: Diagnosis not present

## 2024-03-14 DIAGNOSIS — K219 Gastro-esophageal reflux disease without esophagitis: Secondary | ICD-10-CM | POA: Diagnosis not present

## 2024-03-14 DIAGNOSIS — E039 Hypothyroidism, unspecified: Secondary | ICD-10-CM | POA: Diagnosis not present

## 2024-03-14 DIAGNOSIS — I700 Atherosclerosis of aorta: Secondary | ICD-10-CM | POA: Diagnosis not present

## 2024-03-14 DIAGNOSIS — Z8719 Personal history of other diseases of the digestive system: Secondary | ICD-10-CM | POA: Diagnosis not present

## 2024-03-14 DIAGNOSIS — R7303 Prediabetes: Secondary | ICD-10-CM | POA: Diagnosis not present

## 2024-03-19 ENCOUNTER — Other Ambulatory Visit (HOSPITAL_COMMUNITY)

## 2024-04-04 DIAGNOSIS — I10 Essential (primary) hypertension: Secondary | ICD-10-CM | POA: Diagnosis not present

## 2024-04-06 DIAGNOSIS — I10 Essential (primary) hypertension: Secondary | ICD-10-CM | POA: Diagnosis not present

## 2024-04-06 DIAGNOSIS — E669 Obesity, unspecified: Secondary | ICD-10-CM | POA: Diagnosis not present

## 2024-04-06 DIAGNOSIS — E785 Hyperlipidemia, unspecified: Secondary | ICD-10-CM | POA: Diagnosis not present

## 2024-04-26 ENCOUNTER — Encounter (HOSPITAL_COMMUNITY): Payer: Self-pay

## 2024-04-30 ENCOUNTER — Ambulatory Visit (HOSPITAL_COMMUNITY)
Admission: RE | Admit: 2024-04-30 | Discharge: 2024-04-30 | Disposition: A | Discharge status: Home / Self Care | Source: Ambulatory Visit | Attending: Cardiology | Admitting: Cardiology

## 2024-04-30 ENCOUNTER — Ambulatory Visit: Payer: Self-pay | Admitting: Cardiology

## 2024-04-30 DIAGNOSIS — E782 Mixed hyperlipidemia: Secondary | ICD-10-CM | POA: Insufficient documentation

## 2024-04-30 DIAGNOSIS — I2510 Atherosclerotic heart disease of native coronary artery without angina pectoris: Secondary | ICD-10-CM | POA: Insufficient documentation

## 2024-04-30 DIAGNOSIS — I10 Essential (primary) hypertension: Secondary | ICD-10-CM | POA: Diagnosis not present

## 2024-04-30 LAB — NM PET CT CARDIAC PERFUSION MULTI W/ABSOLUTE BLOODFLOW
LV dias vol: 102 mL (ref 62–150)
LV sys vol: 36 mL (ref 4.2–5.8)
MBFR: 2.37
Nuc Rest EF: 65 %
Nuc Stress EF: 68 %
Peak HR: 97 {beats}/min
Rest HR: 80 {beats}/min
Rest MBF: 0.92 ml/g/min
Rest Nuclear Isotope Dose: 27.2 mCi
ST Depression (mm): 0 mm
Stress MBF: 2.18 ml/g/min
Stress Nuclear Isotope Dose: 27.2 mCi

## 2024-04-30 MED ORDER — RUBIDIUM RB82 GENERATOR (RUBYFILL)
27.2400 | PACK | Freq: Once | INTRAVENOUS | Status: AC
  Administered 2024-04-30: 27.24 via INTRAVENOUS

## 2024-04-30 MED ORDER — REGADENOSON 0.4 MG/5ML IV SOLN
INTRAVENOUS | Status: AC
  Filled 2024-04-30: qty 5

## 2024-04-30 MED ORDER — RUBIDIUM RB82 GENERATOR (RUBYFILL)
27.2300 | PACK | Freq: Once | INTRAVENOUS | Status: AC
  Administered 2024-04-30: 27.23 via INTRAVENOUS

## 2024-04-30 MED ORDER — REGADENOSON 0.4 MG/5ML IV SOLN
0.4000 mg | Freq: Once | INTRAVENOUS | Status: AC
  Administered 2024-04-30: 0.4 mg via INTRAVENOUS

## 2024-05-01 ENCOUNTER — Encounter: Payer: Self-pay | Admitting: Gastroenterology

## 2024-05-02 NOTE — Telephone Encounter (Signed)
 Call to patient to discuss stress test results. Patient verbalizes understanding of stable stress results and normal non-cardiac scan. Patient denies any chest pain. He states he is exercising regularly and experiences brief dyspnea when working out at times, but it resolves quickly in 10 seconds when he sits down to rest.  Patient responses forwarded to Dr. Shlomo.

## 2024-05-02 NOTE — Telephone Encounter (Signed)
-----   Message from Wilbert Bihari sent at 05/01/2024  8:14 AM EDT ----- Please find out if he had any chest pain ----- Message ----- From: Interface, Rad Results In Sent: 04/30/2024  10:56 AM EDT To: Wilbert JONELLE Bihari, MD

## 2024-05-05 DIAGNOSIS — I10 Essential (primary) hypertension: Secondary | ICD-10-CM | POA: Diagnosis not present

## 2024-05-17 DIAGNOSIS — I700 Atherosclerosis of aorta: Secondary | ICD-10-CM | POA: Diagnosis not present

## 2024-05-17 DIAGNOSIS — I10 Essential (primary) hypertension: Secondary | ICD-10-CM | POA: Diagnosis not present

## 2024-05-17 DIAGNOSIS — F419 Anxiety disorder, unspecified: Secondary | ICD-10-CM | POA: Diagnosis not present

## 2024-05-17 DIAGNOSIS — Z0000 Encounter for general adult medical examination without abnormal findings: Secondary | ICD-10-CM | POA: Diagnosis not present

## 2024-05-17 DIAGNOSIS — K760 Fatty (change of) liver, not elsewhere classified: Secondary | ICD-10-CM | POA: Diagnosis not present

## 2024-05-17 DIAGNOSIS — Z860100 Personal history of colon polyps, unspecified: Secondary | ICD-10-CM | POA: Diagnosis not present

## 2024-05-17 DIAGNOSIS — R7303 Prediabetes: Secondary | ICD-10-CM | POA: Diagnosis not present

## 2024-05-17 DIAGNOSIS — G4730 Sleep apnea, unspecified: Secondary | ICD-10-CM | POA: Diagnosis not present

## 2024-05-17 DIAGNOSIS — E291 Testicular hypofunction: Secondary | ICD-10-CM | POA: Diagnosis not present

## 2024-05-17 DIAGNOSIS — E785 Hyperlipidemia, unspecified: Secondary | ICD-10-CM | POA: Diagnosis not present

## 2024-05-17 DIAGNOSIS — E039 Hypothyroidism, unspecified: Secondary | ICD-10-CM | POA: Diagnosis not present

## 2024-05-27 DIAGNOSIS — Z09 Encounter for follow-up examination after completed treatment for conditions other than malignant neoplasm: Secondary | ICD-10-CM | POA: Diagnosis not present

## 2024-05-27 DIAGNOSIS — L578 Other skin changes due to chronic exposure to nonionizing radiation: Secondary | ICD-10-CM | POA: Diagnosis not present

## 2024-05-27 DIAGNOSIS — L570 Actinic keratosis: Secondary | ICD-10-CM | POA: Diagnosis not present

## 2024-06-04 DIAGNOSIS — I10 Essential (primary) hypertension: Secondary | ICD-10-CM | POA: Diagnosis not present

## 2024-06-06 DIAGNOSIS — I10 Essential (primary) hypertension: Secondary | ICD-10-CM | POA: Diagnosis not present

## 2024-07-03 ENCOUNTER — Encounter: Payer: Self-pay | Admitting: Gastroenterology

## 2024-07-04 DIAGNOSIS — I10 Essential (primary) hypertension: Secondary | ICD-10-CM | POA: Diagnosis not present

## 2024-07-07 DIAGNOSIS — E785 Hyperlipidemia, unspecified: Secondary | ICD-10-CM | POA: Diagnosis not present

## 2024-07-07 DIAGNOSIS — E669 Obesity, unspecified: Secondary | ICD-10-CM | POA: Diagnosis not present

## 2024-07-07 DIAGNOSIS — I10 Essential (primary) hypertension: Secondary | ICD-10-CM | POA: Diagnosis not present

## 2024-07-30 DIAGNOSIS — L570 Actinic keratosis: Secondary | ICD-10-CM | POA: Diagnosis not present

## 2024-08-03 DIAGNOSIS — I10 Essential (primary) hypertension: Secondary | ICD-10-CM | POA: Diagnosis not present

## 2024-08-06 DIAGNOSIS — E785 Hyperlipidemia, unspecified: Secondary | ICD-10-CM | POA: Diagnosis not present

## 2024-08-06 DIAGNOSIS — E669 Obesity, unspecified: Secondary | ICD-10-CM | POA: Diagnosis not present

## 2024-08-06 DIAGNOSIS — I10 Essential (primary) hypertension: Secondary | ICD-10-CM | POA: Diagnosis not present

## 2024-09-02 DIAGNOSIS — I10 Essential (primary) hypertension: Secondary | ICD-10-CM | POA: Diagnosis not present

## 2024-09-05 DIAGNOSIS — E669 Obesity, unspecified: Secondary | ICD-10-CM | POA: Diagnosis not present

## 2024-09-05 DIAGNOSIS — E785 Hyperlipidemia, unspecified: Secondary | ICD-10-CM | POA: Diagnosis not present

## 2024-09-05 DIAGNOSIS — G4730 Sleep apnea, unspecified: Secondary | ICD-10-CM | POA: Diagnosis not present

## 2024-09-05 DIAGNOSIS — I10 Essential (primary) hypertension: Secondary | ICD-10-CM | POA: Diagnosis not present

## 2024-09-06 DIAGNOSIS — I10 Essential (primary) hypertension: Secondary | ICD-10-CM | POA: Diagnosis not present

## 2024-09-06 DIAGNOSIS — E785 Hyperlipidemia, unspecified: Secondary | ICD-10-CM | POA: Diagnosis not present

## 2024-09-06 DIAGNOSIS — E669 Obesity, unspecified: Secondary | ICD-10-CM | POA: Diagnosis not present

## 2024-09-24 DIAGNOSIS — L570 Actinic keratosis: Secondary | ICD-10-CM | POA: Diagnosis not present

## 2024-10-02 DIAGNOSIS — I10 Essential (primary) hypertension: Secondary | ICD-10-CM | POA: Diagnosis not present

## 2024-10-06 DIAGNOSIS — I10 Essential (primary) hypertension: Secondary | ICD-10-CM | POA: Diagnosis not present

## 2024-10-06 DIAGNOSIS — E785 Hyperlipidemia, unspecified: Secondary | ICD-10-CM | POA: Diagnosis not present

## 2024-10-06 DIAGNOSIS — E669 Obesity, unspecified: Secondary | ICD-10-CM | POA: Diagnosis not present

## 2024-10-15 ENCOUNTER — Telehealth: Payer: Self-pay | Admitting: Cardiology

## 2024-10-15 NOTE — Telephone Encounter (Signed)
 Pt has been having trouble with ill fitted CPAP. He has been waking up and having more apnea episodes. Please advise.

## 2024-10-28 ENCOUNTER — Encounter: Payer: Self-pay | Admitting: Cardiology

## 2024-10-28 DIAGNOSIS — G4733 Obstructive sleep apnea (adult) (pediatric): Secondary | ICD-10-CM

## 2024-10-28 DIAGNOSIS — I2510 Atherosclerotic heart disease of native coronary artery without angina pectoris: Secondary | ICD-10-CM

## 2024-11-15 ENCOUNTER — Ambulatory Visit: Payer: Self-pay | Admitting: Cardiology

## 2024-11-19 ENCOUNTER — Encounter: Payer: Self-pay | Admitting: Cardiology
